# Patient Record
Sex: Female | Born: 1970 | Race: White | Hispanic: No | Marital: Single | Smoking: Current every day smoker
Health system: Southern US, Community
[De-identification: ages and names within clinical notes are randomized; demographics above are authoritative.]

## PROBLEM LIST (undated history)

## (undated) DIAGNOSIS — F1010 Alcohol abuse, uncomplicated: Secondary | ICD-10-CM

## (undated) DIAGNOSIS — F32A Depression, unspecified: Secondary | ICD-10-CM

## (undated) DIAGNOSIS — K219 Gastro-esophageal reflux disease without esophagitis: Secondary | ICD-10-CM

## (undated) DIAGNOSIS — F329 Major depressive disorder, single episode, unspecified: Secondary | ICD-10-CM

## (undated) DIAGNOSIS — F4310 Post-traumatic stress disorder, unspecified: Secondary | ICD-10-CM

## (undated) DIAGNOSIS — F419 Anxiety disorder, unspecified: Secondary | ICD-10-CM

## (undated) DIAGNOSIS — I10 Essential (primary) hypertension: Secondary | ICD-10-CM

---

## 2005-04-04 ENCOUNTER — Ambulatory Visit: Payer: Self-pay

## 2006-09-06 ENCOUNTER — Inpatient Hospital Stay: Payer: Self-pay | Admitting: Obstetrics & Gynecology

## 2006-11-07 ENCOUNTER — Emergency Department: Payer: Self-pay | Admitting: Emergency Medicine

## 2006-11-22 ENCOUNTER — Emergency Department: Payer: Self-pay

## 2008-04-15 ENCOUNTER — Emergency Department: Payer: Self-pay | Admitting: Emergency Medicine

## 2010-08-29 ENCOUNTER — Ambulatory Visit: Payer: Self-pay | Admitting: Family Medicine

## 2010-10-24 ENCOUNTER — Encounter: Payer: Self-pay | Admitting: Family Medicine

## 2010-11-23 ENCOUNTER — Encounter: Payer: Self-pay | Admitting: Family Medicine

## 2012-07-21 ENCOUNTER — Emergency Department: Payer: Self-pay | Admitting: Emergency Medicine

## 2013-05-26 ENCOUNTER — Emergency Department: Payer: Self-pay | Admitting: Internal Medicine

## 2013-05-26 LAB — COMPREHENSIVE METABOLIC PANEL
Albumin: 4.4 g/dL (ref 3.4–5.0)
Alkaline Phosphatase: 86 U/L (ref 50–136)
BUN: 8 mg/dL (ref 7–18)
Bilirubin,Total: 0.3 mg/dL (ref 0.2–1.0)
Chloride: 103 mmol/L (ref 98–107)
Co2: 23 mmol/L (ref 21–32)
Creatinine: 0.78 mg/dL (ref 0.60–1.30)
EGFR (African American): >60
Glucose: 104 mg/dL — ABNORMAL HIGH (ref 65–99)
Potassium: 3.8 mmol/L (ref 3.5–5.1)
SGOT(AST): 45 U/L — ABNORMAL HIGH (ref 15–37)
SGPT (ALT): 43 U/L (ref 12–78)
Total Protein: 8.3 g/dL — ABNORMAL HIGH (ref 6.4–8.2)

## 2013-05-26 LAB — URINALYSIS, COMPLETE
Glucose,UR: NEGATIVE mg/dL (ref 0–75)
Ketone: NEGATIVE
Leukocyte Esterase: NEGATIVE
Protein: NEGATIVE
Squamous Epithelial: 8
WBC UR: 2 /HPF (ref 0–5)

## 2013-05-26 LAB — DRUG SCREEN, URINE
Amphetamines, Ur Screen: NEGATIVE (ref ?–1000)
Barbiturates, Ur Screen: NEGATIVE (ref ?–200)
Benzodiazepine, Ur Scrn: NEGATIVE (ref ?–200)
Cannabinoid 50 Ng, Ur ~~LOC~~: NEGATIVE (ref ?–50)
Cocaine Metabolite,Ur ~~LOC~~: NEGATIVE (ref ?–300)
MDMA (Ecstasy)Ur Screen: NEGATIVE (ref ?–500)
Methadone, Ur Screen: NEGATIVE (ref ?–300)
Opiate, Ur Screen: NEGATIVE (ref ?–300)

## 2013-05-26 LAB — ETHANOL: Ethanol: 244 mg/dL

## 2013-05-26 LAB — ACETAMINOPHEN LEVEL: Acetaminophen: <2 ug/mL

## 2013-05-26 LAB — CBC
MCH: 35.5 pg — ABNORMAL HIGH (ref 26.0–34.0)
MCHC: 34.3 g/dL (ref 32.0–36.0)
RBC: 4.34 10*6/uL (ref 3.80–5.20)
WBC: 7.5 10*3/uL (ref 3.6–11.0)

## 2013-05-26 LAB — SALICYLATE LEVEL: Salicylates, Serum: 5.4 mg/dL — ABNORMAL HIGH

## 2013-05-26 LAB — TSH: Thyroid Stimulating Horm: 0.768 u[IU]/mL

## 2013-10-05 ENCOUNTER — Emergency Department: Payer: Self-pay | Admitting: Emergency Medicine

## 2013-10-05 LAB — TSH: Thyroid Stimulating Horm: 1.98 u[IU]/mL

## 2013-10-05 LAB — COMPREHENSIVE METABOLIC PANEL
Albumin: 4.4 g/dL (ref 3.4–5.0)
Alkaline Phosphatase: 95 U/L (ref 50–136)
Anion Gap: 11 (ref 7–16)
Chloride: 100 mmol/L (ref 98–107)
Creatinine: 0.62 mg/dL (ref 0.60–1.30)
EGFR (African American): >60
Glucose: 78 mg/dL (ref 65–99)
Osmolality: 264 (ref 275–301)
Potassium: 3.9 mmol/L (ref 3.5–5.1)
SGPT (ALT): 46 U/L (ref 12–78)
Sodium: 134 mmol/L — ABNORMAL LOW (ref 136–145)
Total Protein: 8 g/dL (ref 6.4–8.2)

## 2013-10-05 LAB — URINALYSIS, COMPLETE
Blood: NEGATIVE
Glucose,UR: NEGATIVE mg/dL (ref 0–75)
Ketone: NEGATIVE
RBC,UR: 2 /HPF (ref 0–5)
Specific Gravity: 1.003 (ref 1.003–1.030)
Squamous Epithelial: 1
WBC UR: <1 /HPF (ref 0–5)

## 2013-10-05 LAB — DRUG SCREEN, URINE
Benzodiazepine, Ur Scrn: NEGATIVE (ref ?–200)
MDMA (Ecstasy)Ur Screen: NEGATIVE (ref ?–500)
Methadone, Ur Screen: NEGATIVE (ref ?–300)
Opiate, Ur Screen: NEGATIVE (ref ?–300)
Phencyclidine (PCP) Ur S: NEGATIVE (ref ?–25)

## 2013-10-05 LAB — ETHANOL
Ethanol %: 0.089 % — ABNORMAL HIGH (ref 0.000–0.080)
Ethanol: 89 mg/dL

## 2013-10-05 LAB — CBC
HCT: 41.2 % (ref 35.0–47.0)
HGB: 14.6 g/dL (ref 12.0–16.0)
MCH: 37.4 pg — ABNORMAL HIGH (ref 26.0–34.0)
MCV: 105 fL — ABNORMAL HIGH (ref 80–100)
RBC: 3.91 10*6/uL (ref 3.80–5.20)
RDW: 13.1 % (ref 11.5–14.5)
WBC: 6.1 10*3/uL (ref 3.6–11.0)

## 2013-10-05 LAB — PREGNANCY, URINE: Pregnancy Test, Urine: NEGATIVE m[IU]/mL
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
## 2015-12-19 ENCOUNTER — Emergency Department: Payer: Medicaid Other

## 2015-12-19 ENCOUNTER — Encounter: Payer: Self-pay | Admitting: Emergency Medicine

## 2015-12-19 ENCOUNTER — Inpatient Hospital Stay
Admission: EM | Admit: 2015-12-19 | Discharge: 2015-12-23 | DRG: 872 | Disposition: A | Discharge status: Home / Self Care | Payer: Medicaid Other | Attending: Internal Medicine | Admitting: Internal Medicine

## 2015-12-19 DIAGNOSIS — F10930 Alcohol use, unspecified with withdrawal, uncomplicated: Secondary | ICD-10-CM

## 2015-12-19 DIAGNOSIS — B852 Pediculosis, unspecified: Secondary | ICD-10-CM | POA: Diagnosis present

## 2015-12-19 DIAGNOSIS — Z23 Encounter for immunization: Secondary | ICD-10-CM

## 2015-12-19 DIAGNOSIS — F10230 Alcohol dependence with withdrawal, uncomplicated: Secondary | ICD-10-CM

## 2015-12-19 DIAGNOSIS — K760 Fatty (change of) liver, not elsewhere classified: Secondary | ICD-10-CM | POA: Diagnosis present

## 2015-12-19 DIAGNOSIS — F17210 Nicotine dependence, cigarettes, uncomplicated: Secondary | ICD-10-CM | POA: Diagnosis present

## 2015-12-19 DIAGNOSIS — Z79899 Other long term (current) drug therapy: Secondary | ICD-10-CM

## 2015-12-19 DIAGNOSIS — B349 Viral infection, unspecified: Secondary | ICD-10-CM

## 2015-12-19 DIAGNOSIS — N12 Tubulo-interstitial nephritis, not specified as acute or chronic: Secondary | ICD-10-CM | POA: Diagnosis present

## 2015-12-19 DIAGNOSIS — D6959 Other secondary thrombocytopenia: Secondary | ICD-10-CM | POA: Diagnosis present

## 2015-12-19 DIAGNOSIS — R05 Cough: Secondary | ICD-10-CM | POA: Diagnosis present

## 2015-12-19 DIAGNOSIS — Z888 Allergy status to other drugs, medicaments and biological substances status: Secondary | ICD-10-CM

## 2015-12-19 DIAGNOSIS — F329 Major depressive disorder, single episode, unspecified: Secondary | ICD-10-CM | POA: Diagnosis present

## 2015-12-19 DIAGNOSIS — A4151 Sepsis due to Escherichia coli [E. coli]: Principal | ICD-10-CM | POA: Diagnosis present

## 2015-12-19 DIAGNOSIS — F10239 Alcohol dependence with withdrawal, unspecified: Secondary | ICD-10-CM | POA: Diagnosis present

## 2015-12-19 HISTORY — DX: Depression, unspecified: F32.A

## 2015-12-19 HISTORY — DX: Alcohol abuse, uncomplicated: F10.10

## 2015-12-19 HISTORY — DX: Major depressive disorder, single episode, unspecified: F32.9

## 2015-12-19 LAB — URINALYSIS COMPLETE WITH MICROSCOPIC (ARMC ONLY)
GLUCOSE, UA: NEGATIVE mg/dL
NITRITE: NEGATIVE
Protein, ur: >300 mg/dL — AB
SPECIFIC GRAVITY, URINE: 1.025 (ref 1.005–1.030)
pH: 5 (ref 5.0–8.0)

## 2015-12-19 LAB — CBC
HCT: 48.8 % — ABNORMAL HIGH (ref 35.0–47.0)
HEMOGLOBIN: 16.7 g/dL — AB (ref 12.0–16.0)
MCH: 36.3 pg — ABNORMAL HIGH (ref 26.0–34.0)
MCHC: 34.2 g/dL (ref 32.0–36.0)
MCV: 106.3 fL — ABNORMAL HIGH (ref 80.0–100.0)
PLATELETS: 83 10*3/uL — AB (ref 150–440)
RBC: 4.59 MIL/uL (ref 3.80–5.20)
RDW: 14.2 % (ref 11.5–14.5)
WBC: 8.3 10*3/uL (ref 3.6–11.0)

## 2015-12-19 LAB — COMPREHENSIVE METABOLIC PANEL
ALK PHOS: 88 U/L (ref 38–126)
ALT: 61 U/L — AB (ref 14–54)
ANION GAP: 15 (ref 5–15)
AST: 78 U/L — ABNORMAL HIGH (ref 15–41)
Albumin: 4.4 g/dL (ref 3.5–5.0)
BUN: 9 mg/dL (ref 6–20)
CALCIUM: 9.5 mg/dL (ref 8.9–10.3)
CO2: 24 mmol/L (ref 22–32)
CREATININE: 0.71 mg/dL (ref 0.44–1.00)
Chloride: 91 mmol/L — ABNORMAL LOW (ref 101–111)
Glucose, Bld: 143 mg/dL — ABNORMAL HIGH (ref 65–99)
Potassium: 3.6 mmol/L (ref 3.5–5.1)
SODIUM: 130 mmol/L — AB (ref 135–145)
Total Bilirubin: 1.1 mg/dL (ref 0.3–1.2)
Total Protein: 8.3 g/dL — ABNORMAL HIGH (ref 6.5–8.1)

## 2015-12-19 LAB — TSH: TSH: 1.277 u[IU]/mL (ref 0.350–4.500)

## 2015-12-19 LAB — INFLUENZA PANEL BY PCR (TYPE A & B)
H1N1 flu by pcr: NOT DETECTED
INFLBPCR: NEGATIVE
Influenza A By PCR: NEGATIVE

## 2015-12-19 LAB — LIPASE, BLOOD: LIPASE: 24 U/L (ref 11–51)

## 2015-12-19 LAB — POCT PREGNANCY, URINE: Preg Test, Ur: NEGATIVE

## 2015-12-19 MED ORDER — ONDANSETRON HCL 4 MG/2ML IJ SOLN
4.0000 mg | Freq: Four times a day (QID) | INTRAMUSCULAR | Status: DC | PRN
  Administered 2015-12-20 – 2015-12-21 (×3): 4 mg via INTRAVENOUS
  Filled 2015-12-19 (×3): qty 2

## 2015-12-19 MED ORDER — SODIUM CHLORIDE 0.9 % IV SOLN
Freq: Once | INTRAVENOUS | Status: AC
  Administered 2015-12-19: 14:00:00 via INTRAVENOUS

## 2015-12-19 MED ORDER — ONDANSETRON HCL 4 MG/2ML IJ SOLN
4.0000 mg | Freq: Once | INTRAMUSCULAR | Status: AC
  Administered 2015-12-19: 4 mg via INTRAVENOUS
  Filled 2015-12-19: qty 2

## 2015-12-19 MED ORDER — HEPARIN SODIUM (PORCINE) 5000 UNIT/ML IJ SOLN
5000.0000 [IU] | Freq: Three times a day (TID) | INTRAMUSCULAR | Status: DC
  Administered 2015-12-19 – 2015-12-20 (×5): 5000 [IU] via SUBCUTANEOUS
  Filled 2015-12-19 (×5): qty 1

## 2015-12-19 MED ORDER — IPRATROPIUM-ALBUTEROL 0.5-2.5 (3) MG/3ML IN SOLN
3.0000 mL | RESPIRATORY_TRACT | Status: DC
  Administered 2015-12-19 – 2015-12-20 (×6): 3 mL via RESPIRATORY_TRACT
  Filled 2015-12-19 (×7): qty 3

## 2015-12-19 MED ORDER — FOLIC ACID 1 MG PO TABS
1.0000 mg | ORAL_TABLET | Freq: Every day | ORAL | Status: DC
  Administered 2015-12-19 – 2015-12-22 (×4): 1 mg via ORAL
  Filled 2015-12-19 (×4): qty 1

## 2015-12-19 MED ORDER — CEFTRIAXONE SODIUM 1 G IJ SOLR
1.0000 g | INTRAMUSCULAR | Status: DC
  Administered 2015-12-20 – 2015-12-21 (×2): 1 g via INTRAVENOUS
  Filled 2015-12-19 (×3): qty 10

## 2015-12-19 MED ORDER — SODIUM CHLORIDE 0.9 % IV SOLN
INTRAVENOUS | Status: DC
  Administered 2015-12-20 (×2): via INTRAVENOUS
  Administered 2015-12-21: 1000 mL via INTRAVENOUS
  Administered 2015-12-21 (×2): via INTRAVENOUS

## 2015-12-19 MED ORDER — DEXTROSE 5 % IV SOLN
1.0000 g | Freq: Once | INTRAVENOUS | Status: AC
  Administered 2015-12-19: 1 g via INTRAVENOUS
  Filled 2015-12-19: qty 10

## 2015-12-19 MED ORDER — VANCOMYCIN HCL IN DEXTROSE 1-5 GM/200ML-% IV SOLN
1000.0000 mg | Freq: Once | INTRAVENOUS | Status: AC
  Administered 2015-12-19: 1000 mg via INTRAVENOUS
  Filled 2015-12-19: qty 200

## 2015-12-19 MED ORDER — IBUPROFEN 400 MG PO TABS
800.0000 mg | ORAL_TABLET | Freq: Four times a day (QID) | ORAL | Status: DC | PRN
  Administered 2015-12-19 – 2015-12-20 (×2): 800 mg via ORAL
  Filled 2015-12-19 (×2): qty 2

## 2015-12-19 MED ORDER — HYDROMORPHONE HCL 1 MG/ML IJ SOLN
1.0000 mg | Freq: Once | INTRAMUSCULAR | Status: AC
  Administered 2015-12-19: 1 mg via INTRAVENOUS
  Filled 2015-12-19: qty 1

## 2015-12-19 MED ORDER — LORAZEPAM 2 MG PO TABS
0.0000 mg | ORAL_TABLET | Freq: Two times a day (BID) | ORAL | Status: DC
  Administered 2015-12-21 – 2015-12-23 (×4): 2 mg via ORAL
  Filled 2015-12-19 (×3): qty 1

## 2015-12-19 MED ORDER — LORAZEPAM 2 MG/ML IJ SOLN
0.0000 mg | Freq: Two times a day (BID) | INTRAMUSCULAR | Status: AC
  Administered 2015-12-19 – 2015-12-20 (×2): 2 mg via INTRAVENOUS
  Filled 2015-12-19 (×2): qty 1

## 2015-12-19 MED ORDER — LORAZEPAM 2 MG/ML IJ SOLN
1.0000 mg | Freq: Once | INTRAMUSCULAR | Status: AC
  Administered 2015-12-19: 1 mg via INTRAVENOUS
  Filled 2015-12-19: qty 1

## 2015-12-19 MED ORDER — FLUOXETINE HCL 20 MG PO CAPS
20.0000 mg | ORAL_CAPSULE | Freq: Every day | ORAL | Status: DC
  Administered 2015-12-19 – 2015-12-22 (×4): 20 mg via ORAL
  Filled 2015-12-19 (×4): qty 1

## 2015-12-19 MED ORDER — ONDANSETRON HCL 4 MG PO TABS
4.0000 mg | ORAL_TABLET | Freq: Four times a day (QID) | ORAL | Status: DC | PRN
  Administered 2015-12-22: 4 mg via ORAL
  Filled 2015-12-19: qty 1

## 2015-12-19 MED ORDER — ACETAMINOPHEN 325 MG PO TABS
650.0000 mg | ORAL_TABLET | ORAL | Status: DC | PRN
  Administered 2015-12-19 – 2015-12-21 (×3): 650 mg via ORAL
  Filled 2015-12-19 (×3): qty 2

## 2015-12-19 MED ORDER — BUSPIRONE HCL 10 MG PO TABS
20.0000 mg | ORAL_TABLET | Freq: Every day | ORAL | Status: DC
  Administered 2015-12-19 – 2015-12-22 (×4): 20 mg via ORAL
  Filled 2015-12-19 (×4): qty 2

## 2015-12-19 MED ORDER — SODIUM CHLORIDE 0.9 % IJ SOLN
3.0000 mL | Freq: Two times a day (BID) | INTRAMUSCULAR | Status: DC
  Administered 2015-12-19 – 2015-12-22 (×5): 3 mL via INTRAVENOUS

## 2015-12-19 MED ORDER — SODIUM CHLORIDE 0.9 % IV BOLUS (SEPSIS)
1000.0000 mL | Freq: Once | INTRAVENOUS | Status: AC
  Administered 2015-12-19: 1000 mL via INTRAVENOUS

## 2015-12-19 MED ORDER — LORAZEPAM 2 MG/ML IJ SOLN
0.0000 mg | Freq: Four times a day (QID) | INTRAMUSCULAR | Status: AC
  Administered 2015-12-20 – 2015-12-21 (×2): 2 mg via INTRAVENOUS
  Filled 2015-12-19 (×2): qty 1

## 2015-12-19 MED ORDER — TRAZODONE HCL 100 MG PO TABS
100.0000 mg | ORAL_TABLET | Freq: Every day | ORAL | Status: DC
  Administered 2015-12-19 – 2015-12-22 (×4): 100 mg via ORAL
  Filled 2015-12-19 (×4): qty 1

## 2015-12-19 MED ORDER — HYDROCOD POLST-CPM POLST ER 10-8 MG/5ML PO SUER
5.0000 mL | Freq: Two times a day (BID) | ORAL | Status: DC | PRN
  Administered 2015-12-20 – 2015-12-22 (×2): 5 mL via ORAL
  Filled 2015-12-19 (×2): qty 5

## 2015-12-19 MED ORDER — THIAMINE HCL 100 MG/ML IJ SOLN: 100.0000 mg | Freq: Every day | INTRAMUSCULAR | Status: DC

## 2015-12-19 MED ORDER — NICOTINE 21 MG/24HR TD PT24
21.0000 mg | MEDICATED_PATCH | Freq: Every day | TRANSDERMAL | Status: DC
  Administered 2015-12-19 – 2015-12-22 (×4): 21 mg via TRANSDERMAL
  Filled 2015-12-19 (×4): qty 1

## 2015-12-19 MED ORDER — VITAMIN B-1 100 MG PO TABS
100.0000 mg | ORAL_TABLET | Freq: Every day | ORAL | Status: DC
  Administered 2015-12-19 – 2015-12-22 (×4): 100 mg via ORAL
  Filled 2015-12-19 (×4): qty 1

## 2015-12-19 MED ORDER — ALBUTEROL SULFATE (2.5 MG/3ML) 0.083% IN NEBU: 2.5000 mg | INHALATION_SOLUTION | RESPIRATORY_TRACT | Status: DC | PRN

## 2015-12-19 MED ORDER — ONDANSETRON HCL 4 MG/2ML IJ SOLN
4.0000 mg | Freq: Once | INTRAMUSCULAR | Status: AC | PRN
  Administered 2015-12-19: 4 mg via INTRAVENOUS
  Filled 2015-12-19: qty 2

## 2015-12-19 MED ORDER — LORAZEPAM 2 MG PO TABS
0.0000 mg | ORAL_TABLET | Freq: Four times a day (QID) | ORAL | Status: AC
  Administered 2015-12-21: 2 mg via ORAL
  Filled 2015-12-19 (×2): qty 1

## 2015-12-19 MED ORDER — VANCOMYCIN HCL IN DEXTROSE 1-5 GM/200ML-% IV SOLN
1000.0000 mg | Freq: Two times a day (BID) | INTRAVENOUS | Status: DC
  Administered 2015-12-19 – 2015-12-20 (×2): 1000 mg via INTRAVENOUS
  Filled 2015-12-19 (×3): qty 200

## 2015-12-19 MED ORDER — MORPHINE SULFATE (PF) 2 MG/ML IV SOLN
2.0000 mg | INTRAVENOUS | Status: DC | PRN
  Administered 2015-12-19 – 2015-12-20 (×6): 2 mg via INTRAVENOUS
  Filled 2015-12-19 (×7): qty 1

## 2015-12-19 NOTE — ED Notes (Signed)
Pt vomiting in triage 

## 2015-12-19 NOTE — Progress Notes (Signed)
Pt running a fever of 102.9 on admission. Notified Dr. Sheryle Hailiamond and obtained tylenol 650mg  Q4hours. Tylenol given and fever now 102.5. Notified Dr. Sheryle Hailiamond of results postmedication. New orders to be entered for cough syrup and Ibuprofen. Adelina MingsKim Miyu Fenderson RN-BC [phone]

## 2015-12-19 NOTE — H&P (Signed)
Anna Lucas is an 44 y.o. female.   Chief Complaint: Vomiting HPI: The patient presents emergency department complaining of vomiting that began last night. She states it is nonbloody and nonbilious but that she has not been able to get any relief. She also complains of right flank pain as well as fevers. In the emergency department she was found to be febrile. Urinalysis showed infection which prompted emergency department staff to obtain the abdomen which showed pyelonephritis. The patient met sepsis criteria was found to the emergency department staff to call for admission.  Past Medical History  Diagnosis Date  . Depression   . Alcohol abuse     Past Surgical History  Procedure Laterality Date  . Cesarean section      Family History  Problem Relation Age of Onset  . CAD Paternal Grandfather   . Stroke Paternal Grandfather   . Diabetes Mellitus II Maternal Grandmother   . Emphysema Maternal Grandmother    Social History:  reports that she has been smoking Cigarettes.  She has been smoking about 1.00 pack per day. She does not have any smokeless tobacco history on file. She reports that she drinks alcohol. Her drug history is not on file.  Allergies:  Allergies  Allergen Reactions  . Prednisone     Feet swelling     Medications Prior to Admission  Medication Sig Dispense Refill  . busPIRone (BUSPAR) 10 MG tablet Take 20 mg by mouth at bedtime.    Marland Kitchen FLUoxetine (PROZAC) 10 MG capsule Take 20 mg by mouth daily.     . traZODone (DESYREL) 50 MG tablet Take 100 mg by mouth at bedtime.      Results for orders placed or performed during the hospital encounter of 12/19/15 (from the past 48 hour(s))  Lipase, blood     Status: None   Collection Time: 12/19/15 11:01 AM  Result Value Ref Range   Lipase 24 11 - 51 U/L  Comprehensive metabolic panel     Status: Abnormal   Collection Time: 12/19/15 11:01 AM  Result Value Ref Range   Sodium 130 (L) 135 - 145 mmol/L   Potassium 3.6  3.5 - 5.1 mmol/L   Chloride 91 (L) 101 - 111 mmol/L   CO2 24 22 - 32 mmol/L   Glucose, Bld 143 (H) 65 - 99 mg/dL   BUN 9 6 - 20 mg/dL   Creatinine, Ser 0.71 0.44 - 1.00 mg/dL   Calcium 9.5 8.9 - 10.3 mg/dL   Total Protein 8.3 (H) 6.5 - 8.1 g/dL   Albumin 4.4 3.5 - 5.0 g/dL   AST 78 (H) 15 - 41 U/L   ALT 61 (H) 14 - 54 U/L   Alkaline Phosphatase 88 38 - 126 U/L   Total Bilirubin 1.1 0.3 - 1.2 mg/dL   GFR calc non Af Amer >60 >60 mL/min   GFR calc Af Amer >60 >60 mL/min    Comment: (NOTE) The eGFR has been calculated using the CKD EPI equation. This calculation has not been validated in all clinical situations. eGFR's persistently <60 mL/min signify possible Chronic Kidney Disease.    Anion gap 15 5 - 15  CBC     Status: Abnormal   Collection Time: 12/19/15 11:01 AM  Result Value Ref Range   WBC 8.3 3.6 - 11.0 K/uL   RBC 4.59 3.80 - 5.20 MIL/uL   Hemoglobin 16.7 (H) 12.0 - 16.0 g/dL   HCT 48.8 (H) 35.0 - 47.0 %  MCV 106.3 (H) 80.0 - 100.0 fL   MCH 36.3 (H) 26.0 - 34.0 pg   MCHC 34.2 32.0 - 36.0 g/dL   RDW 14.2 11.5 - 14.5 %   Platelets 83 (L) 150 - 440 K/uL  TSH     Status: None   Collection Time: 12/19/15 11:01 AM  Result Value Ref Range   TSH 1.277 0.350 - 4.500 uIU/mL  Urinalysis complete, with microscopic ([facility] only)     Status: Abnormal   Collection Time: 12/19/15 11:30 AM  Result Value Ref Range   Color, Urine AMBER (A) YELLOW   APPearance CLOUDY (A) CLEAR   Glucose, UA NEGATIVE NEGATIVE mg/dL   Bilirubin Urine 2+ (A) NEGATIVE   Ketones, ur 2+ (A) NEGATIVE mg/dL   Specific Gravity, Urine 1.025 1.005 - 1.030   Hgb urine dipstick 1+ (A) NEGATIVE   pH 5.0 5.0 - 8.0   Protein, ur >300 (A) NEGATIVE mg/dL   Nitrite NEGATIVE NEGATIVE   Leukocytes, UA 3+ (A) NEGATIVE   RBC / HPF TOO NUMEROUS TO COUNT 0 - 5 RBC/hpf   WBC, UA TOO NUMEROUS TO COUNT 0 - 5 WBC/hpf   Bacteria, UA FEW (A) NONE SEEN   Squamous Epithelial / LPF 6-30 (A) NONE SEEN   WBC Clumps PRESENT     Mucous PRESENT   Influenza panel by PCR (type A & B, H1N1)     Status: None   Collection Time: 12/19/15  1:35 PM  Result Value Ref Range   Influenza A By PCR NEGATIVE NEGATIVE   Influenza B By PCR NEGATIVE NEGATIVE   H1N1 flu by pcr NOT DETECTED NOT DETECTED    Comment:        The Xpert Flu assay (FDA approved for nasal aspirates or washes and nasopharyngeal swab specimens), is intended as an aid in the diagnosis of influenza and should not be used as a sole basis for treatment.   Pregnancy, urine POC     Status: None   Collection Time: 12/19/15  1:40 PM  Result Value Ref Range   Preg Test, Ur NEGATIVE NEGATIVE    Comment:        THE SENSITIVITY OF THIS METHODOLOGY IS >24 mIU/mL    Dg Chest 2 View  12/19/2015  CLINICAL DATA:  Mild wheezing, chest pain and shortness of breath for 2 weeks. Initial encounter. EXAM: CHEST  2 VIEW COMPARISON:  None. FINDINGS: The lungs are clear. Heart size is normal. There is no pneumothorax or pleural effusion. No focal bony abnormality. IMPRESSION: No acute disease. Electronically Signed   By: Inge Rise M.D.   On: 12/19/2015 14:28   Ct Renal Stone Study  12/19/2015  CLINICAL DATA:  Right lower quadrant abdominal pain, dysuria and hematuria EXAM: CT ABDOMEN AND PELVIS WITHOUT CONTRAST TECHNIQUE: Multidetector CT imaging of the abdomen and pelvis was performed following the standard protocol without IV contrast. COMPARISON:  None. FINDINGS: Lower chest:  No acute findings. Hepatobiliary: Diffuse hypoattenuation of the liver parenchyma compatible with hepatic steatosis or fatty infiltration. No biliary dilatation. Gallbladder and biliary system unremarkable. Pancreas: No mass or inflammatory process identified on this un-enhanced exam. Spleen: Within normal limits in size. Adrenals/Urinary Tract: Normal adrenal glands. Left kidney and ureter demonstrate no acute obstruction, hydronephrosis, or ureteral calculus. Right kidney demonstrates slight  malrotation and surrounding perinephric strandy edema and inflammation diffusely. No associated hydronephrosis are visualized obstructing ureteral calculus. This may be related to recent stone passage versus acute right pyelonephritis. Stomach/Bowel: Negative for  bowel obstruction, dilatation, ileus, or free air. Normal appendix demonstrated. No fluid collection or abscess. Vascular/Lymphatic: No adenopathy. Minor aortic atherosclerosis without aneurysm. No retroperitoneal hemorrhage. Reproductive: No mass or other significant abnormality. Other: Intact abdominal wall.  No inguinal abnormality or hernia. Musculoskeletal:  no acute or abnormal osseous finding. IMPRESSION: Right perinephric strandy edema and inflammation without hydronephrosis or obstructing urinary tract calculus. This could be secondary to ascending urinary tract infection or pyelonephritis versus recent stone passage. Hepatic steatosis No other acute intra-abdominal or pelvic finding.  Normal appendix. Electronically Signed   By: Jerilynn Mages.  Shick M.D.   On: 12/19/2015 14:17    Review of Systems  Constitutional: Positive for fever. Negative for chills.  HENT: Negative for sore throat and tinnitus.   Eyes: Negative for blurred vision and redness.  Respiratory: Negative for cough and shortness of breath.   Cardiovascular: Negative for chest pain, palpitations, orthopnea and PND.  Gastrointestinal: Positive for nausea and vomiting. Negative for abdominal pain and diarrhea.  Genitourinary: Positive for dysuria, frequency and flank pain. Negative for urgency.  Musculoskeletal: Negative for myalgias and joint pain.  Skin: Negative for rash.       No lesions  Neurological: Negative for speech change, focal weakness and weakness.  Endo/Heme/Allergies: Does not bruise/bleed easily.       No temperature intolerance  Psychiatric/Behavioral: Negative for depression and suicidal ideas.    Blood pressure 142/92, pulse 121, temperature 102.5 F (39.2  C), temperature source Oral, resp. rate 17, height 5' 5"  (1.651 m), weight 77.111 kg (170 lb), last menstrual period 12/05/2015, SpO2 91 %. Physical Exam  Nursing note and vitals reviewed. Constitutional: She is oriented to person, place, and time. She appears well-developed and well-nourished. No distress.  HENT:  Head: Normocephalic and atraumatic.  Mouth/Throat: Oropharynx is clear and moist.  Eyes: Conjunctivae and EOM are normal. Pupils are equal, round, and reactive to light. No scleral icterus.  Neck: Normal range of motion. Neck supple. No JVD present. No tracheal deviation present. No thyromegaly present.  Cardiovascular: Normal rate, regular rhythm and normal heart sounds.  Exam reveals no gallop and no friction rub.   No murmur heard. Respiratory: Effort normal. She has wheezes in the left lower field.  GI: Soft. Bowel sounds are normal. She exhibits no distension. There is no tenderness.  Genitourinary:  Deferred  Musculoskeletal: Normal range of motion. She exhibits no edema.  Lymphadenopathy:    She has no cervical adenopathy.  Neurological: She is alert and oriented to person, place, and time. No cranial nerve deficit. She exhibits normal muscle tone.  Skin: Skin is warm and dry. Rash (Nummular erythematous plaques on face) noted. No erythema.  Psychiatric: She has a normal mood and affect. Her behavior is normal. Judgment and thought content normal.     Assessment/Plan This is a 45 year old Caucasian female admitted for sepsis secondary to pyelonephritis.  1. Sepsis: The patient criteria via fever, tachycardia and tachypnea. She is hemodynamically stable. I have obtained cultures although she received 1 dose of ceftriaxone prior to taking sample. I've added vancomycin for broad spectrum coverage. Follow blood cultures for growth and sensitivities. 2. Pyelonephritis: We'll hydrate the patient as she is unable to consistently hold food down. Antibiotics as above. Will switch  to oral antibiotics when the patient is no longer septic 3. Alcohol abuse: The patient's last drink was last night. She drinks at least 2-3 beers per day. CIWA scale in place. Ativan as needed. Folic acid supplement added to regimen 4.  Tobacco abuse: NicoDerm patch 5. Cough: Likely bronchitic. Supportive care. 6. Depression: Continue trazodone, BuSpar and Prozac 7. DVT prophylaxis: Heparin 8. GI prophylaxis: None The patient is a full code. Time spent on admission orders and patient care approximately 45 minutes  Harrie Foreman 12/19/2015, 6:31 PM

## 2015-12-19 NOTE — ED Provider Notes (Signed)
[facility] Emergency Department Provider Note     Time seen: ----------------------------------------- 1:26 PM on 12/19/2015 -----------------------------------------    I have reviewed the triage vital signs and the nursing notes.   HISTORY  Chief Complaint Cough; Fever; Abdominal Pain; Dysuria; Ankle Pain; and Emesis    HPI Anna Lucas is a 44 y.o. female brought to the ER for a myriad of complaints. Patient presents with left ankle pain, cough, congestion, fever, right-sided flank pain and dysuria. Patient also states she hasn't had any alcohol since last night, typical drinks every day. Patient states she has had fever and chills, has a sharp right-sided pain. Nothing makes it better or worse.   History reviewed. No pertinent past medical history.  There are no active problems to display for this patient.   Past Surgical History  Procedure Laterality Date  . Cesarean section      Allergies Prednisone  Social History Social History  Substance Use Topics  . Smoking status: Current Every Day Smoker -- 1.00 packs/day    Types: Cigarettes  . Smokeless tobacco: None  . Alcohol Use: Yes     Comment: occas    Review of Systems Constitutional: Positive for fever and chills Eyes: Negative for visual changes. ENT: Negative for sore throat. Positive for nasal passage congestion Cardiovascular: Positive for left-sided chest pain Respiratory: Positive for cough with sputum production Gastrointestinal: Positive for abdominal pain with vomiting Genitourinary: Positive for dysuria Musculoskeletal: Positive for back pain Skin: Negative for rash. Neurological: Positive for headache and weakness  10-point ROS otherwise negative.  ____________________________________________   PHYSICAL EXAM:  VITAL SIGNS: ED Triage Vitals  Enc Vitals Group     BP 12/19/15 1054 134/109 mmHg     Pulse Rate 12/19/15 1054 134     Resp 12/19/15 1054 18   Temp 12/19/15 1054 100.3 F (37.9 C)     Temp Source 12/19/15 1054 Oral     SpO2 12/19/15 1054 97 %     Weight 12/19/15 1055 170 lb (77.111 kg)     Height 12/19/15 1055 5' 5"  (1.651 m)     Head Cir --      Peak Flow --      Pain Score 12/19/15 1055 9     Pain Loc --      Pain Edu? --      Excl. in [place]? --     Constitutional: Alert and oriented. Mild to moderate distress Eyes: Conjunctivae are normal. PERRL. Normal extraocular movements. ENT   Head: Normocephalic and atraumatic.   Nose: No congestion/rhinnorhea.   Mouth/Throat: Mucous membranes are moist.   Neck: No stridor. Cardiovascular: Rapid rate, regular rhythm. Normal and symmetric distal pulses are present in all extremities. No murmurs, rubs, or gallops. Respiratory: Normal respiratory effort without tachypnea nor retractions. Mild rhonchi bilaterally Gastrointestinal: Soft and nontender. Right flank tenderness, no rebound or guarding. Normal bowel sounds. Question CVA tenderness on the right. Musculoskeletal: Nontender with normal range of motion in all extremities. No joint effusions.  No lower extremity tenderness nor edema. Neurologic:  Normal speech and language. No gross focal neurologic deficits are appreciated. Speech is normal. No gait instability. Skin:  Skin is warm, dry and intact. No rash noted. Psychiatric: Mood and affect are normal. Speech and behavior are normal. Patient exhibits appropriate insight and judgment.  ____________________________________________  ED COURSE:  Pertinent labs & imaging results that were available during my care of the patient were reviewed by me and considered in my  medical decision making (see chart for details). Patient with multiple complaints, likely viral etiology in addition to alcohol withdrawal and possible kidney stone. ____________________________________________    LABS (pertinent positives/negatives)  Labs Reviewed  COMPREHENSIVE METABOLIC PANEL -  Abnormal; Notable for the following:    Sodium 130 (*)    Chloride 91 (*)    Glucose, Bld 143 (*)    Total Protein 8.3 (*)    AST 78 (*)    ALT 61 (*)    All other components within normal limits  CBC - Abnormal; Notable for the following:    Hemoglobin 16.7 (*)    HCT 48.8 (*)    MCV 106.3 (*)    MCH 36.3 (*)    Platelets 83 (*)    All other components within normal limits  URINALYSIS COMPLETEWITH MICROSCOPIC ([facility] ONLY) - Abnormal; Notable for the following:    Color, Urine AMBER (*)    APPearance CLOUDY (*)    Bilirubin Urine 2+ (*)    Ketones, ur 2+ (*)    Hgb urine dipstick 1+ (*)    Protein, ur >300 (*)    Leukocytes, UA 3+ (*)    Bacteria, UA FEW (*)    Squamous Epithelial / LPF 6-30 (*)    All other components within normal limits  LIPASE, BLOOD  INFLUENZA PANEL BY PCR (TYPE A & B, H1N1)  POCT PREGNANCY, URINE    RADIOLOGY Images were viewed by me  CT renal protocol, chest pain  IMPRESSION: Right perinephric strandy edema and inflammation without hydronephrosis or obstructing urinary tract calculus. This could be secondary to ascending urinary tract infection or pyelonephritis versus recent stone passage.  Hepatic steatosis  No other acute intra-abdominal or pelvic finding. Normal appendix. IMPRESSION: No acute disease.  ____________________________________________  FINAL ASSESSMENT AND PLAN  Viral syndrome, alcohol withdrawal, pyelonephritis  Plan: Patient with labs and imaging as dictated above. Patient looks ill, combination of viral syndrome plus alcohol withdrawal and pyelonephritis. I will place her on withdrawal precautions, she received IV Rocephin. I will also send a urine culture. She would benefit from hospitalization.   Earleen Newport, MD   Earleen Newport, MD 12/19/15 [phone]

## 2015-12-19 NOTE — ED Notes (Signed)
Here with left ankle pain, cough, congestion, fever, RLQ abd pain and burning with urination for a few days now.

## 2015-12-19 NOTE — Progress Notes (Signed)
ANTIBIOTIC CONSULT NOTE - INITIAL  Pharmacy Consult for Vancomycin  Indication: rule out sepsis  Allergies  Allergen Reactions  . Prednisone     Feet swelling     Patient Measurements: Height: 5\' 5"  (165.1 cm) Weight: 170 lb (77.111 kg) IBW/kg (Calculated) : 57 Adjusted Body Weight: 65 kg   Vital Signs: Temp: 102.9 F (39.4 C) (12/26 1701) Temp Source: Oral (12/26 1701) BP: 142/92 mmHg (12/26 1702) Pulse Rate: 121 (12/26 1702) Intake/Output from previous day:   Intake/Output from this shift:    Labs:  Recent Labs  12/19/15 1101  WBC 8.3  HGB 16.7*  PLT 83*  CREATININE 0.71   Estimated Creatinine Clearance: 94 mL/min (by C-G formula based on Cr of 0.71). No results for input(s): VANCOTROUGH, VANCOPEAK, VANCORANDOM, GENTTROUGH, GENTPEAK, GENTRANDOM, TOBRATROUGH, TOBRAPEAK, TOBRARND, AMIKACINPEAK, AMIKACINTROU, AMIKACIN in the last 72 hours.   Microbiology: No results found for this or any previous visit (from the past 720 hour(s)).  Medical History: History reviewed. No pertinent past medical history.  Medications:  Prescriptions prior to admission  Medication Sig Dispense Refill Last Dose  . busPIRone (BUSPAR) 10 MG tablet Take 20 mg by mouth at bedtime.   12/18/2015 at Unknown time  . FLUoxetine (PROZAC) 10 MG capsule Take 20 mg by mouth daily.    12/18/2015 at Unknown time  . traZODone (DESYREL) 50 MG tablet Take 100 mg by mouth at bedtime.   12/18/2015 at Unknown time   Assessment: CrCl = 94 ml/min Ke = 0.08 hr-s T1/2 = 8.7 hrs Vd = 45.5 L   Goal of Therapy:  Vancomycin trough level 15-20 mcg/ml  Plan:  Expected duration 7 days with resolution of temperature and/or normalization of WBC   Vancomycin 1 gm IV X 1 given on 12/26 @ 16:00. Vancomycin 1 gm IV Q12H ordered to start 12/26 @ 22:00, ~ 6 hrs after 1st dose (stacked dosing). This pt will reach [facility] by 12/26 @ 16:00. Will draw 1st trough on 12/28 @ 21:30, which will be at [facility].    Chalon Zobrist D 12/19/2015,5:26 PM

## 2015-12-20 LAB — HEMOGLOBIN A1C: Hgb A1c MFr Bld: 4.9 % (ref 4.0–6.0)

## 2015-12-20 LAB — GLUCOSE, CAPILLARY: Glucose-Capillary: 106 mg/dL — ABNORMAL HIGH (ref 65–99)

## 2015-12-20 MED ORDER — IPRATROPIUM-ALBUTEROL 0.5-2.5 (3) MG/3ML IN SOLN
3.0000 mL | Freq: Four times a day (QID) | RESPIRATORY_TRACT | Status: DC | PRN
  Administered 2015-12-21 – 2015-12-22 (×2): 3 mL via RESPIRATORY_TRACT
  Filled 2015-12-20 (×2): qty 3

## 2015-12-20 MED ORDER — INFLUENZA VAC SPLIT QUAD 0.5 ML IM SUSY
0.5000 mL | PREFILLED_SYRINGE | INTRAMUSCULAR | Status: AC
  Administered 2015-12-21: 0.5 mL via INTRAMUSCULAR
  Filled 2015-12-20: qty 0.5

## 2015-12-20 MED ORDER — PROMETHAZINE HCL 25 MG/ML IJ SOLN
25.0000 mg | Freq: Four times a day (QID) | INTRAMUSCULAR | Status: DC | PRN
  Administered 2015-12-20 – 2015-12-23 (×3): 25 mg via INTRAVENOUS
  Filled 2015-12-20 (×3): qty 1

## 2015-12-20 NOTE — Evaluation (Signed)
Physical Therapy Evaluation Patient Details Name: Anna Lucas MRN: [id number] DOB: 02-May-1971 Today's Date: 12/20/2015   History of Present Illness  presented to ER secondary to R flank pain, constant vomiting; admitted with sepsis due to pyelonephritis.  Clinical Impression  Upon evaluation, patient alert and oriented, follows all commands and demonstrates good insight/safety awareness.  Bilat UE/LEs globally weakened due to medical condition, though functional for basic transfers and mobility.  Able to complete bed mobility with mod indep; sit/stand, basic transfers and short-distance gait (30') without assist device, cga/min assist.  Generally unsteady with R lateral LOB with initial standing, requiring +1 from therapist to recover.  Unable to tolerate additional activity/gait distance due to active vomiting during session (anti-emetics received prior to session).   Would benefit from skilled PT to address above deficits and promote optimal return to PLOF; will continue to follow during remaining hospitalization to ensure continued mobility and return to functional indep, but anticipate no skilled PT needs as medical condition clears.  Will continue to assess/update as appropriate.     Follow Up Recommendations No PT follow up (will continue to see throughout hospitalization to promote mobility as appropriate; anticipate no additional PT needs upon discharge as medical condition clears)    Equipment Recommendations       Recommendations for Other Services       Precautions / Restrictions Precautions Precautions: Fall Restrictions Weight Bearing Restrictions: No      Mobility  Bed Mobility Overal bed mobility: Modified Independent                Transfers Overall transfer level: Needs assistance Equipment used: None Transfers: Sit to/from Stand Sit to Stand: Min assist         General transfer comment: R lateral LOB with initial transition to upright, requiring  LE step strategy and +1 from therapist for recovery  Ambulation/Gait Ambulation/Gait assistance: Min guard;Min assist Ambulation Distance (Feet): 30 Feet Assistive device: None       General Gait Details: generally staggered and unsteady; LE stepping pattern, step height/length and foot placement generally inconsistent; reports feeling generally weak and unsteady.  Stairs            Wheelchair Mobility    Modified Rankin (Stroke Patients Only)       Balance Overall balance assessment: Needs assistance Sitting-balance support: No upper extremity supported;Feet supported Sitting balance-Leahy Scale: Good     Standing balance support: No upper extremity supported Standing balance-Leahy Scale: Fair                               Pertinent Vitals/Pain Pain Assessment: 0-10 Pain Score: 5  Pain Descriptors / Indicators: Aching Pain Intervention(s): Limited activity within patient's tolerance;Monitored during session;Repositioned (anti-emetics administered prior to session)    Home Living Family/patient expects to be discharged to:: Private residence Living Arrangements: Alone   Type of Home: House       Home Layout: Two level;Bed/bath upstairs        Prior Function Level of Independence: Independent         Comments: Indep with household and community mobility; employed as housekeeper at [employer]     Hand Dominance        Extremity/Trunk Assessment   Upper Extremity Assessment: Overall WFL for tasks assessed           Lower Extremity Assessment: Overall WFL for tasks assessed  Communication   Communication: No difficulties  Cognition Arousal/Alertness: Awake/alert Behavior During Therapy: WFL for tasks assessed/performed Overall Cognitive Status: Within Functional Limits for tasks assessed                      General Comments      Exercises        Assessment/Plan    PT Assessment Patient needs  continued PT services  PT Diagnosis Difficulty walking;Generalized weakness   PT Problem List Decreased activity tolerance;Decreased balance;Decreased mobility  PT Treatment Interventions DME instruction;Gait training;Stair training;Functional mobility training;Therapeutic activities;Therapeutic exercise;Balance training;Patient/family education   PT Goals (Current goals can be found in the Care Plan section) Acute Rehab PT Goals Patient Stated Goal: "to make this nausea go away" PT Goal Formulation: With patient/family Time For Goal Achievement: 01/03/16 Potential to Achieve Goals: Good    Frequency Min 2X/week   Barriers to discharge Inaccessible home environment;Decreased caregiver support      Co-evaluation               End of Session Equipment Utilized During Treatment: Gait belt Activity Tolerance:  (limited by nausea) Patient left: in bed;with bed alarm set;with call bell/phone within reach;with family/visitor present           Time: 1610-9604 PT Time Calculation (min) (ACUTE ONLY): 8 min   Charges:   PT Evaluation $Initial PT Evaluation Tier I: 1 Procedure     PT G Codes:        Princella Jaskiewicz H. Manson Passey, PT, DPT, NCS 12/20/2015, 11:19 AM [phone]

## 2015-12-20 NOTE — Progress Notes (Signed)
[facility] - [place] at [facility]   PATIENT NAME: Anna Lucas    MR#:  [id number]  DATE OF BIRTH:  27-Jun-1971  SUBJECTIVE:  CHIEF COMPLAINT:   Chief Complaint  Patient presents with  . Cough  . Fever  . Abdominal Pain    RLQ  . Dysuria  . Ankle Pain  . Emesis    Continues to have dysuria and vomiting. Right flank pain  REVIEW OF SYSTEMS:    Review of Systems  Constitutional: Positive for fever, chills and malaise/fatigue.  HENT: Negative for sore throat.   Eyes: Negative for blurred vision, double vision and pain.  Respiratory: Negative for cough, hemoptysis, shortness of breath and wheezing.   Cardiovascular: Negative for chest pain, palpitations, orthopnea and leg swelling.  Gastrointestinal: Positive for nausea and vomiting. Negative for heartburn, abdominal pain, diarrhea and constipation.  Genitourinary: Positive for dysuria. Negative for hematuria.  Musculoskeletal: Negative for back pain and joint pain.  Skin: Negative for rash.  Neurological: Positive for weakness. Negative for sensory change, speech change, focal weakness and headaches.  Endo/Heme/Allergies: Does not bruise/bleed easily.  Psychiatric/Behavioral: Negative for depression. The patient is not nervous/anxious.       DRUG ALLERGIES:   Allergies  Allergen Reactions  . Prednisone     Feet swelling     VITALS:  Blood pressure 133/82, pulse 100, temperature 99.5 F (37.5 C), temperature source Oral, resp. rate 18, height  (1.651 m), weight 83.144 kg (183 lb 4.8 oz), last menstrual period 12/05/2015, SpO2 99 %.  PHYSICAL EXAMINATION:   Physical Exam  GENERAL:  44 y.o.-year-old patient lying in the bed with no acute distress.  EYES: Pupils equal, round, reactive to light and accommodation. No scleral icterus. Extraocular muscles intact.  HEENT: Head atraumatic, normocephalic. Oropharynx and nasopharynx clear.  NECK:  Supple, no jugular venous distention. No  thyroid enlargement, no tenderness.  LUNGS: Normal breath sounds bilaterally, no wheezing, rales, rhonchi. No use of accessory muscles of respiration.  CARDIOVASCULAR: S1, S2 normal. No murmurs, rubs, or gallops.  ABDOMEN: Soft, nondistended. Bowel sounds present. No organomegaly or mass. Tender right EXTREMITIES: No cyanosis, clubbing or edema b/l.    NEUROLOGIC: Cranial nerves II through XII are intact. No focal Motor or sensory deficits b/l.   PSYCHIATRIC: The patient is alert and oriented x 3.  SKIN: No obvious rash, lesion, or ulcer.    LABORATORY PANEL:   CBC  Recent Labs Lab 12/19/15 1101  WBC 8.3  HGB 16.7*  HCT 48.8*  PLT 83*   ------------------------------------------------------------------------------------------------------------------  Chemistries   Recent Labs Lab 12/19/15 1101  NA 130*  K 3.6  CL 91*  CO2 24  GLUCOSE 143*  BUN 9  CREATININE 0.71  CALCIUM 9.5  AST 78*  ALT 61*  ALKPHOS 88  BILITOT 1.1   ------------------------------------------------------------------------------------------------------------------  Cardiac Enzymes No results for input(s): TROPONINI in the last 168 hours. ------------------------------------------------------------------------------------------------------------------  RADIOLOGY:  Dg Chest 2 View  12/19/2015  CLINICAL DATA:  Mild wheezing, chest pain and shortness of breath for 2 weeks. Initial encounter. EXAM: CHEST  2 VIEW COMPARISON:  None. FINDINGS: The lungs are clear. Heart size is normal. There is no pneumothorax or pleural effusion. No focal bony abnormality. IMPRESSION: No acute disease. Electronically Signed   By: Drusilla Kanner M.D.   On: 12/19/2015 14:28   Ct Renal Stone Study  12/19/2015  CLINICAL DATA:  Right lower quadrant abdominal pain, dysuria and hematuria EXAM: CT ABDOMEN AND PELVIS WITHOUT CONTRAST  TECHNIQUE: Multidetector CT imaging of the abdomen and pelvis was performed following the  standard protocol without IV contrast. COMPARISON:  None. FINDINGS: Lower chest:  No acute findings. Hepatobiliary: Diffuse hypoattenuation of the liver parenchyma compatible with hepatic steatosis or fatty infiltration. No biliary dilatation. Gallbladder and biliary system unremarkable. Pancreas: No mass or inflammatory process identified on this un-enhanced exam. Spleen: Within normal limits in size. Adrenals/Urinary Tract: Normal adrenal glands. Left kidney and ureter demonstrate no acute obstruction, hydronephrosis, or ureteral calculus. Right kidney demonstrates slight malrotation and surrounding perinephric strandy edema and inflammation diffusely. No associated hydronephrosis are visualized obstructing ureteral calculus. This may be related to recent stone passage versus acute right pyelonephritis. Stomach/Bowel: Negative for bowel obstruction, dilatation, ileus, or free air. Normal appendix demonstrated. No fluid collection or abscess. Vascular/Lymphatic: No adenopathy. Minor aortic atherosclerosis without aneurysm. No retroperitoneal hemorrhage. Reproductive: No mass or other significant abnormality. Other: Intact abdominal wall.  No inguinal abnormality or hernia. Musculoskeletal:  no acute or abnormal osseous finding. IMPRESSION: Right perinephric strandy edema and inflammation without hydronephrosis or obstructing urinary tract calculus. This could be secondary to ascending urinary tract infection or pyelonephritis versus recent stone passage. Hepatic steatosis No other acute intra-abdominal or pelvic finding.  Normal appendix. Electronically Signed   By: Judie PetitM.  Shick M.D.   On: 12/19/2015 14:17     ASSESSMENT AND PLAN:   This is a 44 year old Caucasian female admitted for sepsis secondary to pyelonephritis.   1. Sepsis: Due to pyelonephritis  2. Pyelonephritis:  On IV abx. Await cx results. No hydronephrosis  3. Alcohol abuse:  CIWA  4. Tobacco abuse: NicoDerm patch Counseled >  3minutes  5. Cough: Likely bronchitic. Supportive care.  6. Depression: Continue trazodone, BuSpar and Prozac  7. DVT prophylaxis: Heparin   All the records are reviewed and case discussed with Care Management/Social Workerr. Management plans discussed with the patient, family and they are in agreement.  CODE STATUS: FULL  DVT Prophylaxis: SCDs  TOTAL TIME TAKING CARE OF THIS PATIENT: 35 minutes.   POSSIBLE D/C IN 2-3 DAYS, DEPENDING ON CLINICAL CONDITION.   Milagros LollSudini, Deysha Cartier R M.D on 12/20/2015 at 3:26 PM  Between 7am to 6pm - Pager - [phone]  After 6pm go to [email or web address] - password [email or web address] [facility]  [facility]  Office  [phone]  CC: Primary care physician; No primary care provider on file.    Note: This dictation was prepared with Dragon dictation along with smaller phrase technology. Any transcriptional errors that result from this process are unintentional.

## 2015-12-21 LAB — CBC WITH DIFFERENTIAL/PLATELET
BASOS ABS: 0 10*3/uL (ref 0–0.1)
Basophils Relative: 0 %
EOS ABS: 0 10*3/uL (ref 0–0.7)
HCT: 39.3 % (ref 35.0–47.0)
Hemoglobin: 13.4 g/dL (ref 12.0–16.0)
LYMPHS ABS: 0.6 10*3/uL — AB (ref 1.0–3.6)
Lymphocytes Relative: 13 %
MCH: 37.2 pg — AB (ref 26.0–34.0)
MCHC: 34.2 g/dL (ref 32.0–36.0)
MCV: 108.5 fL — AB (ref 80.0–100.0)
MONO ABS: 0.5 10*3/uL (ref 0.2–0.9)
Monocytes Relative: 13 %
Neutro Abs: 3.2 10*3/uL (ref 1.4–6.5)
Neutrophils Relative %: 74 %
PLATELETS: 55 10*3/uL — AB (ref 150–440)
RBC: 3.62 MIL/uL — AB (ref 3.80–5.20)
RDW: 14 % (ref 11.5–14.5)
WBC: 4.3 10*3/uL (ref 3.6–11.0)

## 2015-12-21 LAB — GLUCOSE, CAPILLARY
Glucose-Capillary: 92 mg/dL (ref 65–99)
Glucose-Capillary: 97 mg/dL (ref 65–99)

## 2015-12-21 LAB — BASIC METABOLIC PANEL
Anion gap: 9 (ref 5–15)
BUN: 8 mg/dL (ref 6–20)
CALCIUM: 8.2 mg/dL — AB (ref 8.9–10.3)
CO2: 24 mmol/L (ref 22–32)
Chloride: 100 mmol/L — ABNORMAL LOW (ref 101–111)
Creatinine, Ser: 0.85 mg/dL (ref 0.44–1.00)
GFR calc Af Amer: >60 mL/min (ref >60)
GLUCOSE: 92 mg/dL (ref 65–99)
POTASSIUM: 3.6 mmol/L (ref 3.5–5.1)
SODIUM: 133 mmol/L — AB (ref 135–145)

## 2015-12-21 MED ORDER — METOPROLOL TARTRATE 50 MG PO TABS
50.0000 mg | ORAL_TABLET | Freq: Two times a day (BID) | ORAL | Status: DC
  Administered 2015-12-21 – 2015-12-22 (×4): 50 mg via ORAL
  Filled 2015-12-21 (×4): qty 1

## 2015-12-21 MED ORDER — CLONIDINE HCL 0.1 MG PO TABS
0.2000 mg | ORAL_TABLET | Freq: Once | ORAL | Status: AC
  Administered 2015-12-21: 0.2 mg via ORAL
  Filled 2015-12-21: qty 2

## 2015-12-21 MED ORDER — OXYCODONE HCL 5 MG PO TABS
5.0000 mg | ORAL_TABLET | ORAL | Status: DC | PRN
  Administered 2015-12-21 – 2015-12-22 (×3): 5 mg via ORAL
  Filled 2015-12-21 (×3): qty 1

## 2015-12-21 NOTE — Progress Notes (Signed)
12/21/2015 01:32  Nurse tech Anda KraftErica Trogdon re-checked pt's temperature and found it to be 100.6 after administration of 650mg  P.O. Tylenol.  Will continue to monitor and assess.  Bradly Chrisougherty, Kelsea Mousel E, RN

## 2015-12-21 NOTE — Progress Notes (Signed)
12/21/2015 6:34 AM  Notified MD of elevated BP reading 141/112.  Notified Dr.who ordered one time dose of Clonidine PO 0.2mg .  Will administer and recheck BP after appropriate time has passed.  Bradly Chrisougherty, Aliany Fiorenza E, RN

## 2015-12-21 NOTE — Progress Notes (Signed)
12/21/2015  00:04  Nurse tech Anda KraftErica Trogdon reported pt temperature of 101.4.  Administered 650mg  Tylenol P.O. And will recheck temperature.  Bradly Chrisougherty, Aliesha Dolata E, RN

## 2015-12-21 NOTE — Progress Notes (Signed)
[facility] - [place] at [facility]   PATIENT NAME: Anna Lucas    MR#:  [id number]  DATE OF BIRTH:  1971-02-05  SUBJECTIVE:  CHIEF COMPLAINT:   Chief Complaint  Patient presents with  . Cough  . Fever  . Abdominal Pain    RLQ  . Dysuria  . Ankle Pain  . Emesis    Continues to have dysuria and nausea. Vomiting resolved Right flank pain same. Tmax 101  REVIEW OF SYSTEMS:    Review of Systems  Constitutional: Positive for fever, chills and malaise/fatigue.  HENT: Negative for sore throat.   Eyes: Negative for blurred vision, double vision and pain.  Respiratory: Negative for cough, hemoptysis, shortness of breath and wheezing.   Cardiovascular: Negative for chest pain, palpitations, orthopnea and leg swelling.  Gastrointestinal: Positive for nausea and vomiting. Negative for heartburn, abdominal pain, diarrhea and constipation.  Genitourinary: Positive for dysuria. Negative for hematuria.  Musculoskeletal: Negative for back pain and joint pain.  Skin: Negative for rash.  Neurological: Positive for weakness. Negative for sensory change, speech change, focal weakness and headaches.  Endo/Heme/Allergies: Does not bruise/bleed easily.  Psychiatric/Behavioral: Negative for depression. The patient is not nervous/anxious.       DRUG ALLERGIES:   Allergies  Allergen Reactions  . Prednisone     Feet swelling     VITALS:  Blood pressure 155/102, pulse 87, temperature 98.8 F (37.1 C), temperature source Oral, resp. rate 17, height  (1.651 m), weight 81.784 kg (180 lb 4.8 oz), last menstrual period 12/05/2015, SpO2 97 %.  PHYSICAL EXAMINATION:   Physical Exam  GENERAL:  44 y.o.-year-old patient lying in the bed with no acute distress.  EYES: Pupils equal, round, reactive to light and accommodation. No scleral icterus. Extraocular muscles intact.  HEENT: Head atraumatic, normocephalic. Oropharynx and nasopharynx clear.  NECK:  Supple, no  jugular venous distention. No thyroid enlargement, no tenderness.  LUNGS: Normal breath sounds bilaterally, no wheezing, rales, rhonchi. No use of accessory muscles of respiration.  CARDIOVASCULAR: S1, S2 normal. No murmurs, rubs, or gallops.  ABDOMEN: Soft, nondistended. Bowel sounds present. No organomegaly or mass. Tender right EXTREMITIES: No cyanosis, clubbing or edema b/l.    NEUROLOGIC: Cranial nerves II through XII are intact. No focal Motor or sensory deficits b/l.   PSYCHIATRIC: The patient is alert and oriented x 3.  SKIN: No obvious rash, lesion, or ulcer.    LABORATORY PANEL:   CBC  Recent Labs Lab 12/21/15 0603  WBC 4.3  HGB 13.4  HCT 39.3  PLT 55*   ------------------------------------------------------------------------------------------------------------------  Chemistries   Recent Labs Lab 12/19/15 1101 12/21/15 0603  NA 130* 133*  K 3.6 3.6  CL 91* 100*  CO2 24 24  GLUCOSE 143* 92  BUN 9 8  CREATININE 0.71 0.85  CALCIUM 9.5 8.2*  AST 78*  --   ALT 61*  --   ALKPHOS 88  --   BILITOT 1.1  --    ------------------------------------------------------------------------------------------------------------------  Cardiac Enzymes No results for input(s): TROPONINI in the last 168 hours. ------------------------------------------------------------------------------------------------------------------  RADIOLOGY:  Dg Chest 2 View  12/19/2015  CLINICAL DATA:  Mild wheezing, chest pain and shortness of breath for 2 weeks. Initial encounter. EXAM: CHEST  2 VIEW COMPARISON:  None. FINDINGS: The lungs are clear. Heart size is normal. There is no pneumothorax or pleural effusion. No focal bony abnormality. IMPRESSION: No acute disease. Electronically Signed   By: Drusilla Kanner M.D.   On: 12/19/2015 14:28  Ct Renal Stone Study  12/19/2015  CLINICAL DATA:  Right lower quadrant abdominal pain, dysuria and hematuria EXAM: CT ABDOMEN AND PELVIS WITHOUT  CONTRAST TECHNIQUE: Multidetector CT imaging of the abdomen and pelvis was performed following the standard protocol without IV contrast. COMPARISON:  None. FINDINGS: Lower chest:  No acute findings. Hepatobiliary: Diffuse hypoattenuation of the liver parenchyma compatible with hepatic steatosis or fatty infiltration. No biliary dilatation. Gallbladder and biliary system unremarkable. Pancreas: No mass or inflammatory process identified on this un-enhanced exam. Spleen: Within normal limits in size. Adrenals/Urinary Tract: Normal adrenal glands. Left kidney and ureter demonstrate no acute obstruction, hydronephrosis, or ureteral calculus. Right kidney demonstrates slight malrotation and surrounding perinephric strandy edema and inflammation diffusely. No associated hydronephrosis are visualized obstructing ureteral calculus. This may be related to recent stone passage versus acute right pyelonephritis. Stomach/Bowel: Negative for bowel obstruction, dilatation, ileus, or free air. Normal appendix demonstrated. No fluid collection or abscess. Vascular/Lymphatic: No adenopathy. Minor aortic atherosclerosis without aneurysm. No retroperitoneal hemorrhage. Reproductive: No mass or other significant abnormality. Other: Intact abdominal wall.  No inguinal abnormality or hernia. Musculoskeletal:  no acute or abnormal osseous finding. IMPRESSION: Right perinephric strandy edema and inflammation without hydronephrosis or obstructing urinary tract calculus. This could be secondary to ascending urinary tract infection or pyelonephritis versus recent stone passage. Hepatic steatosis No other acute intra-abdominal or pelvic finding.  Normal appendix. Electronically Signed   By: Judie PetitM.  Shick M.D.   On: 12/19/2015 14:17     ASSESSMENT AND PLAN:   This is a 44 year old Caucasian female admitted for sepsis secondary to pyelonephritis.   1. Sepsis: Due to pyelonephritis  2. Pyelonephritis:  On IV abx. Await cx results. No  hydronephrosis  3. Alcohol abuse:  CIWA  4. Tobacco abuse: NicoDerm patch Counseled  5. Cough: Likely bronchitic. Supportive care.  6. Depression: Continue trazodone, BuSpar and Prozac  7. DVT prophylaxis: Heparin   All the records are reviewed and case discussed with Care Management/Social Workerr. Management plans discussed with the patient, family and they are in agreement.  CODE STATUS: FULL  DVT Prophylaxis: SCDs  TOTAL TIME TAKING CARE OF THIS PATIENT: 35 minutes.   POSSIBLE D/C IN 2-3 DAYS, DEPENDING ON CLINICAL CONDITION.   Milagros LollSudini, Jalene Demo R M.D on 12/21/2015 at 1:27 PM  Between 7am to 6pm - Pager - [phone]  After 6pm go to [email or web address] - password [email or web address] [email or web address]  [facility]  Office  [phone]  CC: Primary care physician; No primary care provider on file.    Note: This dictation was prepared with Dragon dictation along with smaller phrase technology. Any transcriptional errors that result from this process are unintentional.

## 2015-12-22 LAB — URINE CULTURE
Culture: >=100000
Special Requests: NORMAL

## 2015-12-22 MED ORDER — ENSURE ENLIVE PO LIQD: 237.0000 mL | Freq: Two times a day (BID) | ORAL | Status: DC

## 2015-12-22 MED ORDER — FLUCONAZOLE 100 MG PO TABS
200.0000 mg | ORAL_TABLET | Freq: Once | ORAL | Status: AC
  Administered 2015-12-22: 200 mg via ORAL
  Filled 2015-12-22: qty 2

## 2015-12-22 MED ORDER — CIPROFLOXACIN IN D5W 400 MG/200ML IV SOLN
400.0000 mg | Freq: Two times a day (BID) | INTRAVENOUS | Status: DC
  Administered 2015-12-22: 400 mg via INTRAVENOUS
  Filled 2015-12-22 (×3): qty 200

## 2015-12-22 NOTE — Progress Notes (Signed)
[facility] - [place] at [facility]   PATIENT NAME: Anna Lucas    MR#:  [id number]  DATE OF BIRTH:  [birth year]  SUBJECTIVE:  CHIEF COMPLAINT:   Chief Complaint  Patient presents with  . Cough  . Fever  . Abdominal Pain    RLQ  . Dysuria  . Ankle Pain  . Emesis    Continues to have dysuria and nausea. Vomiting resolved. Overall feels better Right flank pain improving. Tmax 99.5  REVIEW OF SYSTEMS:    Review of Systems  Constitutional: Positive for fever, chills and malaise/fatigue.  HENT: Negative for sore throat.   Eyes: Negative for blurred vision, double vision and pain.  Respiratory: Negative for cough, hemoptysis, shortness of breath and wheezing.   Cardiovascular: Negative for chest pain, palpitations, orthopnea and leg swelling.  Gastrointestinal: Positive for nausea and vomiting. Negative for heartburn, abdominal pain, diarrhea and constipation.  Genitourinary: Positive for dysuria. Negative for hematuria.  Musculoskeletal: Negative for back pain and joint pain.  Skin: Negative for rash.  Neurological: Positive for weakness. Negative for sensory change, speech change, focal weakness and headaches.  Endo/Heme/Allergies: Does not bruise/bleed easily.  Psychiatric/Behavioral: Negative for depression. The patient is not nervous/anxious.       DRUG ALLERGIES:   Allergies  Allergen Reactions  . Prednisone     Feet swelling     VITALS:  Blood pressure 124/86, pulse 91, temperature 99.5 F (37.5 C), temperature source Oral, resp. rate 16, height  (1.651 m), weight 83.825 kg (184 lb 12.8 oz), last menstrual period 12/05/2015, SpO2 94 %.  PHYSICAL EXAMINATION:   Physical Exam  GENERAL:  44 y.o.-year-old patient lying in the bed with no acute distress.  EYES: Pupils equal, round, reactive to light and accommodation. No scleral icterus. Extraocular muscles intact.  HEENT: Head atraumatic, normocephalic. Oropharynx and  nasopharynx clear.  NECK:  Supple, no jugular venous distention. No thyroid enlargement, no tenderness.  LUNGS: Normal breath sounds bilaterally, no wheezing, rales, rhonchi. No use of accessory muscles of respiration.  CARDIOVASCULAR: S1, S2 normal. No murmurs, rubs, or gallops.  ABDOMEN: Soft, nondistended. Bowel sounds present. No organomegaly or mass. Tender right EXTREMITIES: No cyanosis, clubbing or edema b/l.    NEUROLOGIC: Cranial nerves II through XII are intact. No focal Motor or sensory deficits b/l.   PSYCHIATRIC: The patient is alert and oriented x 3.  SKIN: No obvious rash, lesion, or ulcer.    LABORATORY PANEL:   CBC  Recent Labs Lab 12/21/15 0603  WBC 4.3  HGB 13.4  HCT 39.3  PLT 55*   ------------------------------------------------------------------------------------------------------------------  Chemistries   Recent Labs Lab 12/19/15 1101 12/21/15 0603  NA 130* 133*  K 3.6 3.6  CL 91* 100*  CO2 24 24  GLUCOSE 143* 92  BUN 9 8  CREATININE 0.71 0.85  CALCIUM 9.5 8.2*  AST 78*  --   ALT 61*  --   ALKPHOS 88  --   BILITOT 1.1  --    ------------------------------------------------------------------------------------------------------------------  Cardiac Enzymes No results for input(s): TROPONINI in the last 168 hours. ------------------------------------------------------------------------------------------------------------------  RADIOLOGY:  No results found.   ASSESSMENT AND PLAN:   This is a 44 year old Caucasian female admitted for sepsis secondary to pyelonephritis.   1. Sepsis: Due to pyelonephritis - resolved  2. Pyelonephritis: Ucx have pansensitive E Coli On IV abx. No hydronephrosis  3. Alcohol abuse:  CIWA  4. Tobacco abuse: NicoDerm patch Counseled  5. Cough: Likely bronchitic. Supportive care.  6.  Depression: Continue trazodone, BuSpar and Prozac  7. DVT prophylaxis: Heparin stopped due to thrombocytopenia  8.  Thrombocytopenia due to alcohol  Likely d/c in AM  Patients mother mentions that patient is predisposed to drug abuse. Has friends who have called enquiring what pain meds she will get prescriptions for at discharge.  Would avoid narcotics at discharge. If needed shortest period possible.  All the records are reviewed and case discussed with Care Management/Social Workerr. Management plans discussed with the patient, family and they are in agreement.  CODE STATUS: FULL  DVT Prophylaxis: SCDs  TOTAL TIME TAKING CARE OF THIS PATIENT: 35 minutes.   POSSIBLE D/C IN 2-3 DAYS, DEPENDING ON CLINICAL CONDITION.     Milagros LollSudini, Ji Feldner R M.D on 12/22/2015 at 1:03 PM  Between 7am to 6pm - Pager - [phone]  After 6pm go to [email or web address] - password [email or web address] [facility]  [facility]  Office  [phone]  CC: Primary care physician; No primary care provider on file.    Note: This dictation was prepared with Dragon dictation along with smaller phrase technology. Any transcriptional errors that result from this process are unintentional.

## 2015-12-22 NOTE — Progress Notes (Signed)
ANTIBIOTIC CONSULT NOTE - INITIAL  Pharmacy Consult for Ciprofloxacin Indication: pyelonephritis  Allergies  Allergen Reactions  . Prednisone     Feet swelling     Patient Measurements: Height:  (165.1 cm) Weight: 184 lb 12.8 oz (83.825 kg) IBW/kg (Calculated) : 57 Adjusted Body Weight:   Vital Signs: Temp: 99.5 F (37.5 C) (12/29 1233) Temp Source: Oral (12/29 1233) BP: 124/86 mmHg (12/29 1233) Pulse Rate: 91 (12/29 1233) Intake/Output from previous day: 12/28 0701 - 12/29 0700 In: 3274.1 [P.O.:1480; I.V.:1744.1; IV Piggyback:50] Out: -  Intake/Output from this shift: Total I/O In: -  Out: 600 [Urine:600]  Labs:  Recent Labs  12/21/15 0603  WBC 4.3  HGB 13.4  PLT 55*  CREATININE 0.85   Estimated Creatinine Clearance: 92.1 mL/min (by C-G formula based on Cr of 0.85). No results for input(s): VANCOTROUGH, VANCOPEAK, VANCORANDOM, GENTTROUGH, GENTPEAK, GENTRANDOM, TOBRATROUGH, TOBRAPEAK, TOBRARND, AMIKACINPEAK, AMIKACINTROU, AMIKACIN in the last 72 hours.   Microbiology: Recent Results (from the past 720 hour(s))  Urine culture     Status: None   Collection Time: 12/19/15 11:30 AM  Result Value Ref Range Status   Specimen Description URINE, CLEAN CATCH  Final   Special Requests Normal  Final   Culture >=100,000 COLONIES/mL ESCHERICHIA COLI  Final   Report Status 12/22/2015 FINAL  Final   Organism ID, Bacteria ESCHERICHIA COLI  Final      Susceptibility   Escherichia coli - MIC*    AMPICILLIN <=2 SENSITIVE Sensitive     CEFAZOLIN <=4 SENSITIVE Sensitive     CEFTRIAXONE <=1 SENSITIVE Sensitive     CIPROFLOXACIN <=0.25 SENSITIVE Sensitive     GENTAMICIN <=1 SENSITIVE Sensitive     IMIPENEM <=0.25 SENSITIVE Sensitive     NITROFURANTOIN <=16 SENSITIVE Sensitive     TRIMETH/SULFA <=20 SENSITIVE Sensitive     Extended ESBL NEGATIVE Sensitive     PIP/TAZO Value in next row Sensitive      SENSITIVE<=4    ERTAPENEM Value in next row Sensitive    SENSITIVE<=0.5    LEVOFLOXACIN Value in next row Sensitive      SENSITIVE<=16    * >=100,000 COLONIES/mL ESCHERICHIA COLI  CULTURE, BLOOD (ROUTINE X 2) w Reflex to PCR ID Panel     Status: None (Preliminary result)   Collection Time: 12/19/15  4:10 PM  Result Value Ref Range Status   Specimen Description BLOOD LEFT ASSIST CONTROL  Final   Special Requests BOTTLES DRAWN AEROBIC AND ANAEROBIC 2CC  Final   Culture NO GROWTH 3 DAYS  Final   Report Status PENDING  Incomplete  CULTURE, BLOOD (ROUTINE X 2) w Reflex to PCR ID Panel     Status: None (Preliminary result)   Collection Time: 12/19/15  4:10 PM  Result Value Ref Range Status   Specimen Description BLOOD RIGHT ASSIST CONTROL  Final   Special Requests NONE  Final   Culture NO GROWTH 3 DAYS  Final   Report Status PENDING  Incomplete    Medical History: Past Medical History  Diagnosis Date  . Depression   . Alcohol abuse     Medications:  Prescriptions prior to admission  Medication Sig Dispense Refill Last Dose  . busPIRone (BUSPAR) 10 MG tablet Take 20 mg by mouth at bedtime.   12/18/2015 at Unknown time  . FLUoxetine (PROZAC) 10 MG capsule Take 20 mg by mouth daily.    12/18/2015 at Unknown time  . traZODone (DESYREL) 50 MG tablet Take 100 mg by mouth at  bedtime.   12/18/2015 at Unknown time   Assessment: CrCl = 92.1 ml/min   Goal of Therapy:  resolution of infection   Plan:  Expected duration 7 days with resolution of temperature and/or normalization of WBC   Ciprofloxacin 400 mg IV Q24H originally ordered.  Will adjust dose to ciprofloxacin 400 mg IV Q12H based on CrCl > 30 ml/min.  Meri Pelot D 12/22/2015,5:17 PM

## 2015-12-22 NOTE — Plan of Care (Signed)
Problem: Safety: Goal: Ability to remain free from injury will improve Outcome: Not Progressing Pt jumps OOB without calling for assistance

## 2015-12-23 LAB — CBC WITH DIFFERENTIAL/PLATELET
Basophils Absolute: 0 K/uL (ref 0–0.1)
Basophils Relative: 0 %
Eosinophils Absolute: 0.1 K/uL (ref 0–0.7)
Eosinophils Relative: 1 %
HCT: 36.6 % (ref 35.0–47.0)
Hemoglobin: 12.4 g/dL (ref 12.0–16.0)
Lymphocytes Relative: 14 %
Lymphs Abs: 0.9 K/uL — ABNORMAL LOW (ref 1.0–3.6)
MCH: 35.7 pg — ABNORMAL HIGH (ref 26.0–34.0)
MCHC: 33.9 g/dL (ref 32.0–36.0)
MCV: 105.3 fL — ABNORMAL HIGH (ref 80.0–100.0)
Monocytes Absolute: 1.1 K/uL — ABNORMAL HIGH (ref 0.2–0.9)
Monocytes Relative: 17 %
Neutro Abs: 4.3 K/uL (ref 1.4–6.5)
Neutrophils Relative %: 68 %
Platelets: 92 K/uL — ABNORMAL LOW (ref 150–440)
RBC: 3.48 MIL/uL — ABNORMAL LOW (ref 3.80–5.20)
RDW: 13.9 % (ref 11.5–14.5)
WBC: 6.5 K/uL (ref 3.6–11.0)

## 2015-12-23 MED ORDER — CIPROFLOXACIN HCL 500 MG PO TABS: 500.0000 mg | ORAL_TABLET | Freq: Two times a day (BID) | ORAL | Status: DC

## 2015-12-23 MED ORDER — OXYCODONE HCL 5 MG PO TABS: 5.0000 mg | ORAL_TABLET | Freq: Four times a day (QID) | ORAL | Status: DC | PRN

## 2015-12-23 MED ORDER — PERMETHRIN 5 % EX CREA
TOPICAL_CREAM | Freq: Once | CUTANEOUS | Status: DC
  Filled 2015-12-23: qty 60

## 2015-12-23 MED ORDER — METOPROLOL TARTRATE 50 MG PO TABS: 50.0000 mg | ORAL_TABLET | Freq: Two times a day (BID) | ORAL | Status: DC

## 2015-12-23 MED ORDER — PERMETHRIN 5 % EX CREA: TOPICAL_CREAM | Freq: Once | CUTANEOUS | Status: DC

## 2015-12-23 MED ORDER — ONDANSETRON HCL 4 MG PO TABS: 4.0000 mg | ORAL_TABLET | Freq: Four times a day (QID) | ORAL | Status: DC | PRN

## 2015-12-23 NOTE — Care Management (Signed)
Spoke with patient and mother Anna Bibleat. Patient is low income and has no insurance. Gave [facility] for [facility], [facility] dollar list. [facility] [facility] [place] along with copies of Rx for Zophran, Lopressor, Cipro. Gave original Rx to patient's mother who will be accompanying patient to pick up meds. [facility] was unable to fill Roxicodone and Elimite. [facility] Patient expressed understaning of importance of taking medications as directed. No other CM needs identified.

## 2015-12-23 NOTE — Progress Notes (Signed)
[facility]         [place], [place]   12/23/2015  Patient: Anna Lucas   Date of Birth:  07/05/1971  Date of admission:  12/19/2015  Date of Discharge  12/23/2015    To Whom it May Concern:   Anna Lucas  Can return to work on 12/26/2015 without restrictions.  If you have any questions or concerns, please don't hesitate to call.  Sincerely,   Milagros LollSudini, Delphina Schum R M.D Pager [phone]- [phone] Office : [phone]   .

## 2015-12-23 NOTE — Progress Notes (Signed)
12/23/2015 10:40 AM Anna FeilJoyce M Lucas to be D/C'd Home per MD order.  Discussed prescriptions and follow up appointments with the patient. Prescriptions given to patient, medication list explained in detail. Pt verbalized understanding.    Medication List    TAKE these medications        busPIRone 10 MG tablet  Commonly known as:  BUSPAR  Take 20 mg by mouth at bedtime.     ciprofloxacin 500 MG tablet  Commonly known as:  CIPRO  Take 1 tablet (500 mg total) by mouth 2 (two) times daily.     FLUoxetine 10 MG capsule  Commonly known as:  PROZAC  Take 20 mg by mouth daily.     metoprolol 50 MG tablet  Commonly known as:  LOPRESSOR  Take 1 tablet (50 mg total) by mouth 2 (two) times daily.     ondansetron 4 MG tablet  Commonly known as:  ZOFRAN  Take 1 tablet (4 mg total) by mouth every 6 (six) hours as needed for nausea.     oxyCODONE 5 MG immediate release tablet  Commonly known as:  Oxy IR/ROXICODONE  Take 1 tablet (5 mg total) by mouth every 6 (six) hours as needed for moderate pain or severe pain.     permethrin 5 % cream  Commonly known as:  ELIMITE  Apply topically once.     traZODone 50 MG tablet  Commonly known as:  DESYREL  Take 100 mg by mouth at bedtime.        Filed Vitals:   12/22/15 2021 12/23/15 0612  BP: 138/93 127/88  Pulse: 93 87  Temp: 98.6 F (37 C) 98.4 F (36.9 C)  Resp: 18 18    Skin clean, dry and intact without evidence of skin break down, no evidence of skin tears noted. IV catheter discontinued intact. Site without signs and symptoms of complications. Dressing and pressure applied. Pt denies pain at this time. No complaints noted.  An After Visit Summary was printed and given to the patient. Patient escorted via WC, and D/C home via private auto.  Bradly Chrisougherty, [name]

## 2015-12-23 NOTE — Discharge Instructions (Signed)
°  DIET:  Regular diet  DISCHARGE CONDITION:  Stable  ACTIVITY:  Activity as tolerated  OXYGEN:  Home Oxygen: No.   Oxygen Delivery: room air  DISCHARGE LOCATION:  home   If you experience worsening of your admission symptoms, develop shortness of breath, life threatening emergency, suicidal or homicidal thoughts you must seek medical attention immediately by calling 911 or calling your MD immediately  if symptoms less severe.  You Must read complete instructions/literature along with all the possible adverse reactions/side effects for all the Medicines you take and that have been prescribed to you. Take any new Medicines after you have completely understood and accpet all the possible adverse reactions/side effects.   Please note  You were cared for by a hospitalist during your hospital stay. If you have any questions about your discharge medications or the care you received while you were in the hospital after you are discharged, you can call the unit and asked to speak with the hospitalist on call if the hospitalist that took care of you is not available. Once you are discharged, your primary care physician will handle any further medical issues. Please note that NO REFILLS for any discharge medications will be authorized once you are discharged, as it is imperative that you return to your primary care physician (or establish a relationship with a primary care physician if you do not have one) for your aftercare needs so that they can reassess your need for medications and monitor your lab values.  Please QUIT ALCOHOL and SMOKING

## 2015-12-24 LAB — CULTURE, BLOOD (ROUTINE X 2)
Culture: NO GROWTH
Culture: NO GROWTH

## 2015-12-25 NOTE — Discharge Summary (Signed)
[place] at [place] NAME: Anna Lucas    MR#:  [id number]  DATE OF BIRTH:  09-11-1971  DATE OF ADMISSION:  12/19/2015 ADMITTING PHYSICIAN: Harrie Foreman, MD  DATE OF DISCHARGE: 12/23/2015 10:45 AM  PRIMARY CARE PHYSICIAN: No primary care provider on file.    ADMISSION DIAGNOSIS:  Viral syndrome [B34.9] Pyelonephritis [N12] Alcohol withdrawal, uncomplicated ([place]) [G99.242]  DISCHARGE DIAGNOSIS:  Active Problems:   Pyelonephritis   SECONDARY DIAGNOSIS:   Past Medical History  Diagnosis Date  . Depression   . Alcohol abuse      ADMITTING HISTORY  Chief Complaint: Vomiting HPI: The patient presents emergency department complaining of vomiting that began last night. She states it is nonbloody and nonbilious but that she has not been able to get any relief. She also complains of right flank pain as well as fevers. In the emergency department she was found to be febrile. Urinalysis showed infection which prompted emergency department staff to obtain the abdomen which showed pyelonephritis. The patient met sepsis criteria was found to the emergency department staff to call for admission.   HOSPITAL COURSE:   This is a 45 year old Caucasian female admitted for sepsis secondary to pyelonephritis.   1. Sepsis: Due to pyelonephritis - resolved  2. Pyelonephritis: Ucx have pansensitive E Coli On IV abx. No hydronephrosis Changed to PO Ciprofloxacin for 1 week at discharge. Blood Cx negative  3. Alcohol withdrawal CIWA Resolved  4. Tobacco abuse: NicoDerm patch Counseled  5. Cough: Likely bronchitic. Supportive care.  6. Depression: Continue trazodone, BuSpar and Prozac  7. DVT prophylaxis: Heparin stopped due to thrombocytopenia  8. Thrombocytopenia due to alcohol and sepsis - Improving. No bleeding  Stable for discharge home  permethrin script for Lice.  Work note given  CONSULTS OBTAINED:      DRUG ALLERGIES:   Allergies  Allergen Reactions  . Prednisone     Feet swelling     DISCHARGE MEDICATIONS:   Discharge Medication List as of 12/23/2015 10:21 AM    START taking these medications   Details  ciprofloxacin (CIPRO) 500 MG tablet Take 1 tablet (500 mg total) by mouth 2 (two) times daily., Starting 12/23/2015, Until Discontinued, Print    metoprolol (LOPRESSOR) 50 MG tablet Take 1 tablet (50 mg total) by mouth 2 (two) times daily., Starting 12/23/2015, Until Discontinued, Print    ondansetron (ZOFRAN) 4 MG tablet Take 1 tablet (4 mg total) by mouth every 6 (six) hours as needed for nausea., Starting 12/23/2015, Until Discontinued, Print    oxyCODONE (OXY IR/ROXICODONE) 5 MG immediate release tablet Take 1 tablet (5 mg total) by mouth every 6 (six) hours as needed for moderate pain or severe pain., Starting 12/23/2015, Until Discontinued, Print    permethrin (ELIMITE) 5 % cream Apply topically once., Starting 12/23/2015, Print      CONTINUE these medications which have NOT CHANGED   Details  busPIRone (BUSPAR) 10 MG tablet Take 20 mg by mouth at bedtime., Until Discontinued, Historical Med    FLUoxetine (PROZAC) 10 MG capsule Take 20 mg by mouth daily. , Until Discontinued, Historical Med    traZODone (DESYREL) 50 MG tablet Take 100 mg by mouth at bedtime., Until Discontinued, Historical Med         Today    VITAL SIGNS:  Blood pressure 127/88, pulse 87, temperature 98.4 F (36.9 C), temperature source Oral, resp. rate 18, height 5' 5"  (1.651 m), weight 83.825 kg (184 lb 12.8  oz), last menstrual period 12/05/2015, SpO2 94 %.  I/O:  No intake or output data in the 24 hours ending 12/25/15 0523  PHYSICAL EXAMINATION:  Physical Exam  GENERAL:  45 y.o.-year-old patient lying in the bed with no acute distress.  LUNGS: Normal breath sounds bilaterally, no wheezing, rales,rhonchi or crepitation. No use of accessory muscles of respiration.  CARDIOVASCULAR:  S1, S2 normal. No murmurs, rubs, or gallops.  ABDOMEN: Soft, non-tender, non-distended. Bowel sounds present. No organomegaly or mass.  NEUROLOGIC: Moves all 4 extremities. PSYCHIATRIC: The patient is alert and oriented x 3.  SKIN: No obvious rash, lesion, or ulcer.   DATA REVIEW:   CBC  Recent Labs Lab 12/23/15 0445  WBC 6.5  HGB 12.4  HCT 36.6  PLT 92*    Chemistries   Recent Labs Lab 12/19/15 1101 12/21/15 0603  NA 130* 133*  K 3.6 3.6  CL 91* 100*  CO2 24 24  GLUCOSE 143* 92  BUN 9 8  CREATININE 0.71 0.85  CALCIUM 9.5 8.2*  AST 78*  --   ALT 61*  --   ALKPHOS 88  --   BILITOT 1.1  --     Cardiac Enzymes No results for input(s): TROPONINI in the last 168 hours.  Microbiology Results  Results for orders placed or performed during the hospital encounter of 12/19/15  Urine culture     Status: None   Collection Time: 12/19/15 11:30 AM  Result Value Ref Range Status   Specimen Description URINE, CLEAN CATCH  Final   Special Requests Normal  Final   Culture >=100,000 COLONIES/mL ESCHERICHIA COLI  Final   Report Status 12/22/2015 FINAL  Final   Organism ID, Bacteria ESCHERICHIA COLI  Final      Susceptibility   Escherichia coli - MIC*    AMPICILLIN <=2 SENSITIVE Sensitive     CEFAZOLIN <=4 SENSITIVE Sensitive     CEFTRIAXONE <=1 SENSITIVE Sensitive     CIPROFLOXACIN <=0.25 SENSITIVE Sensitive     GENTAMICIN <=1 SENSITIVE Sensitive     IMIPENEM <=0.25 SENSITIVE Sensitive     NITROFURANTOIN <=16 SENSITIVE Sensitive     TRIMETH/SULFA <=20 SENSITIVE Sensitive     Extended ESBL NEGATIVE Sensitive     PIP/TAZO Value in next row Sensitive      SENSITIVE<=4    ERTAPENEM Value in next row Sensitive      SENSITIVE<=0.5    LEVOFLOXACIN Value in next row Sensitive      SENSITIVE<=16    * >=100,000 COLONIES/mL ESCHERICHIA COLI  CULTURE, BLOOD (ROUTINE X 2) w Reflex to PCR ID Panel     Status: None   Collection Time: 12/19/15  4:10 PM  Result Value Ref Range  Status   Specimen Description BLOOD LEFT ASSIST CONTROL  Final   Special Requests BOTTLES DRAWN AEROBIC AND ANAEROBIC 2CC  Final   Culture NO GROWTH 5 DAYS  Final   Report Status 12/24/2015 FINAL  Final  CULTURE, BLOOD (ROUTINE X 2) w Reflex to PCR ID Panel     Status: None   Collection Time: 12/19/15  4:10 PM  Result Value Ref Range Status   Specimen Description BLOOD RIGHT ASSIST CONTROL  Final   Special Requests NONE  Final   Culture NO GROWTH 5 DAYS  Final   Report Status 12/24/2015 FINAL  Final    RADIOLOGY:  No results found.    Follow up with PCP in 1 week.  Management plans discussed with the patient, family and they are  in agreement.  CODE STATUS:   TOTAL TIME TAKING CARE OF THIS PATIENT ON DAY OF DISCHARGE: more than 30 minutes.    Hillary Bow R M.D on 12/25/2015 at 5:23 AM  Between 7am to 6pm - Pager - [phone]  After 6pm go to [email or web address] - password [email or web address] [facility]  [place]  Office  [phone]  CC: Primary care physician; No primary care provider on file.     Note: This dictation was prepared with Dragon dictation along with smaller phrase technology. Any transcriptional errors that result from this process are unintentional.

## 2016-01-16 ENCOUNTER — Ambulatory Visit: Payer: Medicaid Other

## 2016-02-02 ENCOUNTER — Emergency Department
Admission: EM | Admit: 2016-02-02 | Discharge: 2016-02-02 | Disposition: A | Discharge status: Home / Self Care | Payer: Medicaid Other | Attending: Emergency Medicine | Admitting: Emergency Medicine

## 2016-02-02 ENCOUNTER — Emergency Department: Payer: Medicaid Other

## 2016-02-02 ENCOUNTER — Encounter: Payer: Self-pay | Admitting: Emergency Medicine

## 2016-02-02 DIAGNOSIS — J40 Bronchitis, not specified as acute or chronic: Secondary | ICD-10-CM | POA: Insufficient documentation

## 2016-02-02 DIAGNOSIS — F17210 Nicotine dependence, cigarettes, uncomplicated: Secondary | ICD-10-CM | POA: Insufficient documentation

## 2016-02-02 DIAGNOSIS — R109 Unspecified abdominal pain: Secondary | ICD-10-CM | POA: Insufficient documentation

## 2016-02-02 DIAGNOSIS — Z3202 Encounter for pregnancy test, result negative: Secondary | ICD-10-CM | POA: Insufficient documentation

## 2016-02-02 DIAGNOSIS — I10 Essential (primary) hypertension: Secondary | ICD-10-CM | POA: Insufficient documentation

## 2016-02-02 DIAGNOSIS — Z79899 Other long term (current) drug therapy: Secondary | ICD-10-CM | POA: Insufficient documentation

## 2016-02-02 HISTORY — DX: Anxiety disorder, unspecified: F41.9

## 2016-02-02 HISTORY — DX: Post-traumatic stress disorder, unspecified: F43.10

## 2016-02-02 HISTORY — DX: Essential (primary) hypertension: I10

## 2016-02-02 LAB — BASIC METABOLIC PANEL
ANION GAP: 13 (ref 5–15)
BUN: 8 mg/dL (ref 6–20)
CALCIUM: 9.3 mg/dL (ref 8.9–10.3)
CO2: 22 mmol/L (ref 22–32)
CREATININE: 0.68 mg/dL (ref 0.44–1.00)
Chloride: 103 mmol/L (ref 101–111)
GLUCOSE: 62 mg/dL — AB (ref 65–99)
Potassium: 3.6 mmol/L (ref 3.5–5.1)
Sodium: 138 mmol/L (ref 135–145)

## 2016-02-02 LAB — URINALYSIS COMPLETE WITH MICROSCOPIC (ARMC ONLY)
BACTERIA UA: NONE SEEN
Bilirubin Urine: NEGATIVE
Glucose, UA: 50 mg/dL — AB
LEUKOCYTES UA: NEGATIVE
NITRITE: NEGATIVE
PH: 5 (ref 5.0–8.0)
PROTEIN: NEGATIVE mg/dL
RBC / HPF: NONE SEEN RBC/hpf (ref 0–5)
SPECIFIC GRAVITY, URINE: 1.008 (ref 1.005–1.030)

## 2016-02-02 LAB — CBC
HCT: 43.9 % (ref 35.0–47.0)
HEMOGLOBIN: 15 g/dL (ref 12.0–16.0)
MCH: 36.5 pg — AB (ref 26.0–34.0)
MCHC: 34.1 g/dL (ref 32.0–36.0)
MCV: 107.1 fL — ABNORMAL HIGH (ref 80.0–100.0)
PLATELETS: 166 10*3/uL (ref 150–440)
RBC: 4.1 MIL/uL (ref 3.80–5.20)
RDW: 14.5 % (ref 11.5–14.5)
WBC: 7.5 10*3/uL (ref 3.6–11.0)

## 2016-02-02 LAB — POCT PREGNANCY, URINE: PREG TEST UR: NEGATIVE

## 2016-02-02 LAB — TROPONIN I: TROPONIN I: 0.03 ng/mL (ref <0.031)

## 2016-02-02 MED ORDER — ALBUTEROL SULFATE HFA 108 (90 BASE) MCG/ACT IN AERS: 2.0000 | INHALATION_SPRAY | Freq: Four times a day (QID) | RESPIRATORY_TRACT | Status: DC | PRN

## 2016-02-02 MED ORDER — IPRATROPIUM-ALBUTEROL 0.5-2.5 (3) MG/3ML IN SOLN
3.0000 mL | Freq: Once | RESPIRATORY_TRACT | Status: AC
  Administered 2016-02-02: 3 mL via RESPIRATORY_TRACT
  Filled 2016-02-02: qty 3

## 2016-02-02 NOTE — Discharge Instructions (Signed)
Please seek medical attention for any bloody cough, leg swelling, sharp chest pain, pain that is worse with deep breaths, shortness of breath, change in behavior, persistent vomiting, bloody stool or any other new or concerning symptoms.   Upper Respiratory Infection, Adult Most upper respiratory infections (URIs) are caused by a virus. A URI affects the nose, throat, and upper air passages. The most common type of URI is often called "the common cold." HOME CARE   Take medicines only as told by your doctor.  Gargle warm saltwater or take cough drops to comfort your throat as told by your doctor.  Use a warm mist humidifier or inhale steam from a shower to increase air moisture. This may make it easier to breathe.  Drink enough fluid to keep your pee (urine) clear or pale yellow.  Eat soups and other clear broths.  Have a healthy diet.  Rest as needed.  Go back to work when your fever is gone or your doctor says it is okay.  You may need to stay home longer to avoid giving your URI to others.  You can also wear a face mask and wash your hands often to prevent spread of the virus.  Use your inhaler more if you have asthma.  Do not use any tobacco products, including cigarettes, chewing tobacco, or electronic cigarettes. If you need help quitting, ask your doctor. GET HELP IF:  You are getting worse, not better.  Your symptoms are not helped by medicine.  You have chills.  You are getting more short of breath.  You have brown or red mucus.  You have yellow or brown discharge from your nose.  You have pain in your face, especially when you bend forward.  You have a fever.  You have puffy (swollen) neck glands.  You have pain while swallowing.  You have white areas in the back of your throat. GET HELP RIGHT AWAY IF:   You have very bad or constant:  Headache.  Ear pain.  Pain in your forehead, behind your eyes, and over your cheekbones (sinus pain).  Chest  pain.  You have long-lasting (chronic) lung disease and any of the following:  Wheezing.  Long-lasting cough.  Coughing up blood.  A change in your usual mucus.  You have a stiff neck.  You have changes in your:  Vision.  Hearing.  Thinking.  Mood. MAKE SURE YOU:   Understand these instructions.  Will watch your condition.  Will get help right away if you are not doing well or get worse.   This information is not intended to replace advice given to you by your health care provider. Make sure you discuss any questions you have with your health care provider.   Document Released: 05/28/2008 Document Revised: 04/26/2015 Document Reviewed: 03/17/2014 Elsevier Interactive Patient Education [employer]

## 2016-02-02 NOTE — ED Provider Notes (Signed)
[facility] Emergency Department Provider Note  ____________________________________________  Time seen: ~1230  I have reviewed the triage vital signs and the nursing notes.   HISTORY  Chief Complaint Chest Pain and Flank Pain   History limited by: Not Limited   HPI Anna Lucas is a 45 y.o. female who presents to the emergency department today because of concerns for episode of palpitations and shortness of breath this morning. The patient states that she has been feeling unwell for a week. She has had a cough for a week. She has felt lethargic. She has not noticed any fevers. She states this morning she started feeling like her heart was racing. This lasted for a couple of hours. During that time she also had associated shortness of breath. She had continued cough. She also states some left shoulder discomfort. She does have a history of panic attacks and states that this reminds her of her panic attacks in the past, however it lasted a little bit longer than normal. Patient states that she smokes. Did drink alcohol last night.     Past Medical History  Diagnosis Date  . Depression   . Alcohol abuse   . Hypertension   . Anxiety   . PTSD (post-traumatic stress disorder)     Patient Active Problem List   Diagnosis Date Noted  . Pyelonephritis 12/19/2015    Past Surgical History  Procedure Laterality Date  . Cesarean section      Current Outpatient Rx  Name  Route  Sig  Dispense  Refill  . busPIRone (BUSPAR) 10 MG tablet   Oral   Take 20 mg by mouth at bedtime.         Marland Kitchen FLUoxetine (PROZAC) 10 MG capsule   Oral   Take 20 mg by mouth daily.          . metoprolol (LOPRESSOR) 50 MG tablet   Oral   Take 1 tablet (50 mg total) by mouth 2 (two) times daily.   60 tablet   0   . traZODone (DESYREL) 50 MG tablet   Oral   Take 100 mg by mouth at bedtime.         . ciprofloxacin (CIPRO) 500 MG tablet   Oral   Take 1 tablet (500 mg  total) by mouth 2 (two) times daily. Patient not taking: Reported on 02/02/2016   14 tablet   0   . ondansetron (ZOFRAN) 4 MG tablet   Oral   Take 1 tablet (4 mg total) by mouth every 6 (six) hours as needed for nausea. Patient not taking: Reported on 02/02/2016   20 tablet   0   . oxyCODONE (OXY IR/ROXICODONE) 5 MG immediate release tablet   Oral   Take 1 tablet (5 mg total) by mouth every 6 (six) hours as needed for moderate pain or severe pain. Patient not taking: Reported on 02/02/2016   10 tablet   0   . permethrin (ELIMITE) 5 % cream   Topical   Apply topically once. Patient not taking: Reported on 02/02/2016   60 g   0     Allergies Prednisone  Family History  Problem Relation Age of Onset  . CAD Paternal Grandfather   . Stroke Paternal Grandfather   . Diabetes Mellitus II Maternal Grandmother   . Emphysema Maternal Grandmother     Social History Social History  Substance Use Topics  . Smoking status: Current Every Day Smoker -- 1.00 packs/day  Types: Cigarettes  . Smokeless tobacco: None  . Alcohol Use: Yes     Comment: occas    Review of Systems  Constitutional: Negative for fever. Cardiovascular: Negative for chest pain. Respiratory: Positive for shortness of breath. Gastrointestinal: Negative for abdominal pain, vomiting and diarrhea. Neurological: Negative for headaches, focal weakness or numbness.  10-point ROS otherwise negative.  ____________________________________________   PHYSICAL EXAM:  VITAL SIGNS: ED Triage Vitals  Enc Vitals Group     BP 02/02/16 1022 149/102 mmHg     Pulse Rate 02/02/16 1020 98     Resp 02/02/16 1020 20     Temp 02/02/16 1020 98.3 F (36.8 C)     Temp Source 02/02/16 1020 Oral     SpO2 02/02/16 1020 95 %     Weight 02/02/16 1020 165 lb (74.844 kg)     Height 02/02/16 1020  (1.651 m)     Head Cir --      Peak Flow --      Pain Score 02/02/16 1021 7   Constitutional: Alert and oriented. Well  appearing and in no distress. Eyes: Conjunctivae are normal. PERRL. Normal extraocular movements. ENT   Head: Normocephalic and atraumatic.   Nose: No congestion/rhinnorhea.   Mouth/Throat: Mucous membranes are moist.   Neck: No stridor. Hematological/Lymphatic/Immunilogical: No cervical lymphadenopathy. Cardiovascular: Normal rate, regular rhythm.  No murmurs, rubs, or gallops. Respiratory: Normal respiratory effort without tachypnea nor retractions. Diffuse bilateral expiratory wheezing. Occasional dry cough. Gastrointestinal: Soft and nontender. No distention.  Genitourinary: Deferred Musculoskeletal: Normal range of motion in all extremities. No joint effusions.  No lower extremity tenderness nor edema. Neurologic:  Normal speech and language. No gross focal neurologic deficits are appreciated.  Skin:  Skin is warm, dry and intact. No rash noted. Psychiatric: Mood and affect are normal. Speech and behavior are normal. Patient exhibits appropriate insight and judgment.  ____________________________________________    LABS (pertinent positives/negatives)  Labs Reviewed  CBC - Abnormal; Notable for the following:    MCV 107.1 (*)    MCH 36.5 (*)    All other components within normal limits  URINALYSIS COMPLETEWITH MICROSCOPIC ([facility] ONLY) - Abnormal; Notable for the following:    Color, Urine YELLOW (*)    APPearance CLEAR (*)    Glucose, UA 50 (*)    Ketones, ur TRACE (*)    Hgb urine dipstick 1+ (*)    Squamous Epithelial / LPF 0-5 (*)    All other components within normal limits  TROPONIN I  BASIC METABOLIC PANEL  POC URINE PREG, ED  POCT PREGNANCY, URINE     ____________________________________________   EKG  I, Phineas Semen, attending physician, personally viewed and interpreted this EKG  EKG Time: 1019 Rate: 105 Rhythm: sinus tachycardia Axis: normal Intervals: qtc 470 QRS: narrow, q waves V1 ST changes: no st elevation Impression:  abnormal ekg  ____________________________________________    RADIOLOGY  CXR  IMPRESSION: No active cardiopulmonary disease.  ____________________________________________   PROCEDURES  Procedure(s) performed: None  Critical Care performed: No  ____________________________________________   INITIAL IMPRESSION / ASSESSMENT AND PLAN / ED COURSE  Pertinent labs & imaging results that were available during my care of the patient were reviewed by me and considered in my medical decision making (see chart for details).  Patient presented to the emergency department today after an episode of heart racing, shortness of breath. She also had some concern for flank pain. Urine did not show any signs of kidney stones or infection. In  terms of the shortness of breath and heart racing she does have history of panic attacks and I think this is possible. In additional follow-up could be SVT given the alcohol use last night. Furthermore I do think is possible patient had worsening of her bronchitis. She does have bilateral wheezing and is a smoker. Additionally states that she has had a cough for at least the past week. I did discuss possibility of blood clots with patient given tachycardia on EKG however patient without any significant chest pain, is not hypoxic nor tachypneic at this point I do have concern that d-dimer would be elevated secondary to bronchitis. I think that the radiation risk of his CTA would outweigh the low risk for PE. I did encourage primary care follow-up and gave PE and DVT return precautions..  ____________________________________________   FINAL CLINICAL IMPRESSION(S) / ED DIAGNOSES  Final diagnoses:  Bronchitis     Phineas Semen, MD 02/02/16 1253

## 2016-02-02 NOTE — ED Notes (Signed)
Pt reports she felt like her heart was racing this morning, left shoulder pain and shortness of breath. Pt also reports left flank pain, foul smelling urine.

## 2016-08-08 ENCOUNTER — Ambulatory Visit: Payer: Medicaid Other | Attending: Internal Medicine

## 2016-08-17 ENCOUNTER — Emergency Department: Payer: Self-pay

## 2016-08-17 ENCOUNTER — Encounter: Payer: Self-pay | Admitting: Emergency Medicine

## 2016-08-17 ENCOUNTER — Inpatient Hospital Stay
Admission: EM | Admit: 2016-08-17 | Discharge: 2016-08-19 | DRG: 641 | Disposition: A | Discharge status: Home / Self Care | Payer: Self-pay | Attending: Internal Medicine | Admitting: Internal Medicine

## 2016-08-17 ENCOUNTER — Other Ambulatory Visit: Payer: Self-pay

## 2016-08-17 DIAGNOSIS — I10 Essential (primary) hypertension: Secondary | ICD-10-CM | POA: Diagnosis present

## 2016-08-17 DIAGNOSIS — F17210 Nicotine dependence, cigarettes, uncomplicated: Secondary | ICD-10-CM | POA: Diagnosis present

## 2016-08-17 DIAGNOSIS — R1110 Vomiting, unspecified: Secondary | ICD-10-CM | POA: Diagnosis present

## 2016-08-17 DIAGNOSIS — R319 Hematuria, unspecified: Secondary | ICD-10-CM | POA: Diagnosis present

## 2016-08-17 DIAGNOSIS — F10129 Alcohol abuse with intoxication, unspecified: Secondary | ICD-10-CM | POA: Diagnosis present

## 2016-08-17 DIAGNOSIS — Z8249 Family history of ischemic heart disease and other diseases of the circulatory system: Secondary | ICD-10-CM

## 2016-08-17 DIAGNOSIS — F418 Other specified anxiety disorders: Secondary | ICD-10-CM | POA: Diagnosis present

## 2016-08-17 DIAGNOSIS — F1010 Alcohol abuse, uncomplicated: Secondary | ICD-10-CM

## 2016-08-17 DIAGNOSIS — F4310 Post-traumatic stress disorder, unspecified: Secondary | ICD-10-CM | POA: Diagnosis present

## 2016-08-17 DIAGNOSIS — K760 Fatty (change of) liver, not elsewhere classified: Secondary | ICD-10-CM | POA: Diagnosis present

## 2016-08-17 DIAGNOSIS — K5289 Other specified noninfective gastroenteritis and colitis: Secondary | ICD-10-CM | POA: Diagnosis present

## 2016-08-17 DIAGNOSIS — Z823 Family history of stroke: Secondary | ICD-10-CM

## 2016-08-17 DIAGNOSIS — Z833 Family history of diabetes mellitus: Secondary | ICD-10-CM

## 2016-08-17 DIAGNOSIS — E876 Hypokalemia: Principal | ICD-10-CM | POA: Diagnosis present

## 2016-08-17 DIAGNOSIS — Z825 Family history of asthma and other chronic lower respiratory diseases: Secondary | ICD-10-CM

## 2016-08-17 DIAGNOSIS — Z79899 Other long term (current) drug therapy: Secondary | ICD-10-CM

## 2016-08-17 LAB — POTASSIUM: POTASSIUM: 3.1 mmol/L — AB (ref 3.5–5.1)

## 2016-08-17 LAB — URINALYSIS COMPLETE WITH MICROSCOPIC (ARMC ONLY)
Glucose, UA: 100 mg/dL — AB
LEUKOCYTES UA: NEGATIVE
NITRITE: NEGATIVE
PH: 6 (ref 5.0–8.0)
PROTEIN: 100 mg/dL — AB

## 2016-08-17 LAB — COMPREHENSIVE METABOLIC PANEL
ALBUMIN: 4.3 g/dL (ref 3.5–5.0)
ALT: 83 U/L — ABNORMAL HIGH (ref 14–54)
ANION GAP: 17 — AB (ref 5–15)
AST: 194 U/L — ABNORMAL HIGH (ref 15–41)
Alkaline Phosphatase: 127 U/L — ABNORMAL HIGH (ref 38–126)
BILIRUBIN TOTAL: 3.1 mg/dL — AB (ref 0.3–1.2)
BUN: 8 mg/dL (ref 6–20)
CALCIUM: 9.6 mg/dL (ref 8.9–10.3)
CO2: 26 mmol/L (ref 22–32)
Chloride: 91 mmol/L — ABNORMAL LOW (ref 101–111)
Creatinine, Ser: 0.84 mg/dL (ref 0.44–1.00)
GLUCOSE: 131 mg/dL — AB (ref 65–99)
POTASSIUM: 2.9 mmol/L — AB (ref 3.5–5.1)
Sodium: 134 mmol/L — ABNORMAL LOW (ref 135–145)
TOTAL PROTEIN: 8 g/dL (ref 6.5–8.1)

## 2016-08-17 LAB — MAGNESIUM
MAGNESIUM: 1.9 mg/dL (ref 1.7–2.4)
Magnesium: 1.2 mg/dL — ABNORMAL LOW (ref 1.7–2.4)

## 2016-08-17 LAB — CBC
HEMATOCRIT: 44.7 % (ref 35.0–47.0)
HEMOGLOBIN: 15.7 g/dL (ref 12.0–16.0)
MCH: 37.9 pg — ABNORMAL HIGH (ref 26.0–34.0)
MCHC: 35.2 g/dL (ref 32.0–36.0)
MCV: 107.7 fL — ABNORMAL HIGH (ref 80.0–100.0)
Platelets: 120 10*3/uL — ABNORMAL LOW (ref 150–440)
RBC: 4.15 MIL/uL (ref 3.80–5.20)
RDW: 14.7 % — ABNORMAL HIGH (ref 11.5–14.5)
WBC: 6.5 10*3/uL (ref 3.6–11.0)

## 2016-08-17 LAB — PREGNANCY, URINE: Preg Test, Ur: NEGATIVE

## 2016-08-17 LAB — ETHANOL: Alcohol, Ethyl (B): 23 mg/dL — ABNORMAL HIGH (ref <5)

## 2016-08-17 LAB — LIPASE, BLOOD: Lipase: 21 U/L (ref 11–51)

## 2016-08-17 MED ORDER — LORAZEPAM 2 MG/ML IJ SOLN
0.0000 mg | Freq: Two times a day (BID) | INTRAMUSCULAR | Status: DC
Start: 2016-08-19 — End: 2016-08-19

## 2016-08-17 MED ORDER — THIAMINE HCL 100 MG/ML IJ SOLN
100.0000 mg | Freq: Every day | INTRAMUSCULAR | Status: DC
  Filled 2016-08-17: qty 1

## 2016-08-17 MED ORDER — ADULT MULTIVITAMIN W/MINERALS CH
1.0000 | ORAL_TABLET | Freq: Every day | ORAL | Status: DC
  Administered 2016-08-17 – 2016-08-19 (×2): 1 via ORAL
  Filled 2016-08-17 (×4): qty 1

## 2016-08-17 MED ORDER — FOLIC ACID 1 MG PO TABS
1.0000 mg | ORAL_TABLET | Freq: Every day | ORAL | Status: DC
  Administered 2016-08-17 – 2016-08-19 (×2): 1 mg via ORAL
  Filled 2016-08-17 (×4): qty 1

## 2016-08-17 MED ORDER — SODIUM CHLORIDE 0.9 % IV BOLUS (SEPSIS)
1000.0000 mL | Freq: Once | INTRAVENOUS | Status: AC
  Administered 2016-08-17: 1000 mL via INTRAVENOUS

## 2016-08-17 MED ORDER — POTASSIUM CHLORIDE CRYS ER 20 MEQ PO TBCR
20.0000 meq | EXTENDED_RELEASE_TABLET | Freq: Once | ORAL | Status: AC
  Administered 2016-08-17: 20 meq via ORAL
  Filled 2016-08-17: qty 1

## 2016-08-17 MED ORDER — MAGNESIUM SULFATE 2 GM/50ML IV SOLN
2.0000 g | Freq: Once | INTRAVENOUS | Status: AC
  Administered 2016-08-17: 2 g via INTRAVENOUS
  Filled 2016-08-17 (×2): qty 50

## 2016-08-17 MED ORDER — LORAZEPAM 1 MG PO TABS: 1.0000 mg | ORAL_TABLET | Freq: Four times a day (QID) | ORAL | Status: DC | PRN

## 2016-08-17 MED ORDER — ONDANSETRON HCL 4 MG/2ML IJ SOLN: 4.0000 mg | Freq: Four times a day (QID) | INTRAMUSCULAR | Status: DC | PRN

## 2016-08-17 MED ORDER — POTASSIUM CHLORIDE IN NACL 40-0.9 MEQ/L-% IV SOLN
INTRAVENOUS | Status: DC
  Administered 2016-08-17: 100 mL/h via INTRAVENOUS
  Filled 2016-08-17 (×3): qty 1000

## 2016-08-17 MED ORDER — SODIUM CHLORIDE 0.9 % IV SOLN
1.0000 mg | Freq: Once | INTRAVENOUS | Status: AC
  Administered 2016-08-17: 1 mg via INTRAVENOUS
  Filled 2016-08-17: qty 0.2

## 2016-08-17 MED ORDER — VITAMIN B-1 100 MG PO TABS
100.0000 mg | ORAL_TABLET | Freq: Every day | ORAL | Status: DC
  Administered 2016-08-17 – 2016-08-19 (×2): 100 mg via ORAL
  Filled 2016-08-17 (×4): qty 1

## 2016-08-17 MED ORDER — TRAMADOL HCL 50 MG PO TABS
50.0000 mg | ORAL_TABLET | Freq: Four times a day (QID) | ORAL | Status: DC | PRN
  Administered 2016-08-17 – 2016-08-19 (×2): 50 mg via ORAL
  Filled 2016-08-17 (×2): qty 1

## 2016-08-17 MED ORDER — LORAZEPAM 2 MG/ML IJ SOLN
1.0000 mg | Freq: Once | INTRAMUSCULAR | Status: AC
  Administered 2016-08-17: 1 mg via INTRAVENOUS
  Filled 2016-08-17: qty 1

## 2016-08-17 MED ORDER — POTASSIUM CHLORIDE CRYS ER 20 MEQ PO TBCR
40.0000 meq | EXTENDED_RELEASE_TABLET | Freq: Once | ORAL | Status: AC
  Administered 2016-08-17: 40 meq via ORAL
  Filled 2016-08-17 (×2): qty 2

## 2016-08-17 MED ORDER — THIAMINE HCL 100 MG/ML IJ SOLN
100.0000 mg | Freq: Every day | INTRAMUSCULAR | Status: DC
  Administered 2016-08-17 – 2016-08-19 (×2): 100 mg via INTRAVENOUS
  Filled 2016-08-17 (×2): qty 2

## 2016-08-17 MED ORDER — ENOXAPARIN SODIUM 40 MG/0.4ML ~~LOC~~ SOLN
40.0000 mg | SUBCUTANEOUS | Status: DC
  Administered 2016-08-17 – 2016-08-18 (×2): 40 mg via SUBCUTANEOUS
  Filled 2016-08-17 (×2): qty 0.4

## 2016-08-17 MED ORDER — LORAZEPAM 2 MG/ML IJ SOLN
0.0000 mg | Freq: Four times a day (QID) | INTRAMUSCULAR | Status: DC
Start: 2016-08-17 — End: 2016-08-19
  Administered 2016-08-17 – 2016-08-19 (×4): 2 mg via INTRAVENOUS
  Filled 2016-08-17 (×2): qty 1
  Filled 2016-08-17: qty 2

## 2016-08-17 MED ORDER — ONDANSETRON HCL 4 MG PO TABS: 4.0000 mg | ORAL_TABLET | Freq: Four times a day (QID) | ORAL | Status: DC | PRN

## 2016-08-17 MED ORDER — LORAZEPAM 2 MG/ML IJ SOLN
1.0000 mg | Freq: Four times a day (QID) | INTRAMUSCULAR | Status: DC | PRN
  Filled 2016-08-17: qty 1

## 2016-08-17 NOTE — ED Triage Notes (Signed)
Patient presents to the ED with lack of appetite for several days, nausea, vomiting, abdominal pain and hematuria with dysuria.  Patient reports having episodes of hyperventilation with tingling in hands and feet.  Patient appears slightly anxious in triage but denies tingling at this time.  Patient is not hyperventilating at this time.  Patient is alert and oriented x 4.  No obvious distress.  Ambulatory to triage without difficulty.

## 2016-08-17 NOTE — ED Notes (Signed)
Pt stating that she is feeling much better. Pt asking for something to "eat." Pt given some apple juice to see if she is able to tolerate this first.

## 2016-08-17 NOTE — Progress Notes (Addendum)
Pharmacy electrolyte management   Patient's evening K+ is 3.1 and Mg 1.9. 20 mEq KCl PO x1 ordered. Patient is also receiving fluids with 40 mEq KCl at 100 mL/hours. Recheck BMP with AM labs.  8/26 AM electrolytes WNL. F/u BMP tomorrow AM.

## 2016-08-17 NOTE — ED Notes (Signed)
Pt has had decreased appetite with vomiting for about a week.. Pt also stating that she started having problems with urination. Pt stating that the urine looks orange to bloody. Pt c/o pain with LLQ and Left lower back pain. Pt stating pain 8/10. Pt is denying nausea at this time. Pt stating that also yesterday her hands and feet "drew up." Pt stating that it was like cramps.

## 2016-08-17 NOTE — ED Notes (Signed)
Pt stating that she smokes about 1 pack a day and that she drinks 2 to 3 "fruit drinks" a day. Pt stating that she drinks Bailey's also.

## 2016-08-17 NOTE — H&P (Signed)
[facility] [place] [facility]   PATIENT NAME: Anna Lucas    MR#:  [id number]  DATE OF BIRTH:  1971/02/22  DATE OF ADMISSION:  08/17/2016  PRIMARY CARE PHYSICIAN: No PCP Per Patient   REQUESTING/REFERRING PHYSICIAN: Dr Darnelle Catalan  CHIEF COMPLAINT:  Intractable nausea vomiting  HISTORY OF PRESENT ILLNESS:  Anna Lucas  is a 45 y.o. female with a known history of Alcohol abuse, tobacco abuse, hypertension and history of PTSD comes to the emergency room with intractable nausea vomiting for last couple days. Patient is not able to keep anything orally although she is able to keep her alcohol in!! Last vomiting was this morning She was found to have low potassium and low magnesium. Clinically appears dehydrated and central medicine was called for further evaluation and management Patient drank alcohol few drinks today  PAST MEDICAL HISTORY:   Past Medical History:  Diagnosis Date  . Alcohol abuse   . Anxiety   . Depression   . Hypertension   . PTSD (post-traumatic stress disorder)     PAST SURGICAL HISTOIRY:   Past Surgical History:  Procedure Laterality Date  . CESAREAN SECTION      SOCIAL HISTORY:   Social History  Substance Use Topics  . Smoking status: Current Every Day Smoker    Packs/day: 1.00    Types: Cigarettes  . Smokeless tobacco: Never Used  . Alcohol use Yes     Comment: occas    FAMILY HISTORY:   Family History  Problem Relation Age of Onset  . CAD Paternal Grandfather   . Stroke Paternal Grandfather   . Diabetes Mellitus II Maternal Grandmother   . Emphysema Maternal Grandmother     DRUG ALLERGIES:   Allergies  Allergen Reactions  . Prednisone     Feet swelling     REVIEW OF SYSTEMS:  Review of Systems  Constitutional: Negative for chills, fever and weight loss.  HENT: Negative for ear discharge, ear pain and nosebleeds.   Eyes: Negative for blurred vision, pain and discharge.  Respiratory: Negative  for sputum production, shortness of breath, wheezing and stridor.   Cardiovascular: Negative for chest pain, palpitations, orthopnea and PND.  Gastrointestinal: Positive for nausea and vomiting. Negative for abdominal pain and diarrhea.  Genitourinary: Negative for frequency and urgency.  Musculoskeletal: Negative for back pain and joint pain.  Neurological: Positive for weakness. Negative for sensory change, speech change and focal weakness.  Psychiatric/Behavioral: Negative for depression and hallucinations. The patient is not nervous/anxious.      MEDICATIONS AT HOME:   Prior to Admission medications   Medication Sig Start Date End Date Taking? Authorizing Provider  albuterol (PROVENTIL HFA;VENTOLIN HFA) 108 (90 Base) MCG/ACT inhaler Inhale 2 puffs into the lungs every 6 (six) hours as needed for wheezing or shortness of breath. 02/02/16  Yes Phineas Semen, MD  busPIRone (BUSPAR) 10 MG tablet Take 20 mg by mouth at bedtime.   Yes Historical Provider, MD  FLUoxetine (PROZAC) 20 MG capsule Take 20 mg by mouth daily.    Yes Historical Provider, MD  metoprolol (LOPRESSOR) 50 MG tablet Take 1 tablet (50 mg total) by mouth 2 (two) times daily. 12/23/15  Yes Srikar Sudini, MD  omeprazole (PRILOSEC) 40 MG capsule Take 40 mg by mouth daily.   Yes Historical Provider, MD  traZODone (DESYREL) 50 MG tablet Take 100 mg by mouth at bedtime.   Yes Historical Provider, MD  ondansetron (ZOFRAN) 4 MG tablet Take 1 tablet (4 mg  total) by mouth every 6 (six) hours as needed for nausea. Patient not taking: Reported on 02/02/2016 12/23/15   Milagros LollSrikar Sudini, MD  oxyCODONE (OXY IR/ROXICODONE) 5 MG immediate release tablet Take 1 tablet (5 mg total) by mouth every 6 (six) hours as needed for moderate pain or severe pain. Patient not taking: Reported on 02/02/2016 12/23/15   Milagros LollSrikar Sudini, MD  permethrin (ELIMITE) 5 % cream Apply topically once. Patient not taking: Reported on 02/02/2016 12/23/15   Milagros LollSrikar Sudini, MD       VITAL SIGNS:  Blood pressure 135/78, pulse 92, temperature 98.6 F (37 C), temperature source Oral, resp. rate 20, height 5' 5.5" (1.664 m), weight 170 lb (77.1 kg), SpO2 94 %.  PHYSICAL EXAMINATION:  GENERAL:  45 y.o.-year-old patient lying in the bed with no acute distress.  EYES: Pupils equal, round, reactive to light and accommodation. No scleral icterus. Extraocular muscles intact.  HEENT: Head atraumatic, normocephalic. Oropharynx and nasopharynx clear.  NECK:  Supple, no jugular venous distention. No thyroid enlargement, no tenderness.  LUNGS: Normal breath sounds bilaterally, no wheezing, rales,rhonchi or crepitation. No use of accessory muscles of respiration.  CARDIOVASCULAR: S1, S2 normal. No murmurs, rubs, or gallops.  ABDOMEN: Soft, nontender, nondistended. Bowel sounds present. No organomegaly or mass.  EXTREMITIES: No pedal edema, cyanosis, or clubbing.  NEUROLOGIC: Cranial nerves II through XII are intact. Muscle strength 5/5 in all extremities. Sensation intact. Gait not checked.  PSYCHIATRIC: The patient is alert and oriented x 3.  SKIN: No obvious rash, lesion, or ulcer.   LABORATORY PANEL:   CBC  Recent Labs Lab 08/17/16 1140  WBC 6.5  HGB 15.7  HCT 44.7  PLT 120*   ------------------------------------------------------------------------------------------------------------------  Chemistries   Recent Labs Lab 08/17/16 1140  NA 134*  K 2.9*  CL 91*  CO2 26  GLUCOSE 131*  BUN 8  CREATININE 0.84  CALCIUM 9.6  MG 1.2*  AST 194*  ALT 83*  ALKPHOS 127*  BILITOT 3.1*   ------------------------------------------------------------------------------------------------------------------  Cardiac Enzymes No results for input(s): TROPONINI in the last 168 hours. ------------------------------------------------------------------------------------------------------------------  RADIOLOGY:  Ct Renal Stone Study  Result Date: 08/17/2016 CLINICAL DATA:   Patient presents to the ED with lack of appetite for several days, nausea, vomiting, abdominal pain and hematuria with dysuria. Patient reports having episodes of hyperventilation with tingling in hands and feet. Patient appears slightly anxious. EXAM: CT ABDOMEN AND PELVIS WITHOUT CONTRAST TECHNIQUE: Multidetector CT imaging of the abdomen and pelvis was performed following the standard protocol without IV contrast. COMPARISON:  12/19/2015 FINDINGS: Lung bases:  Clear.  Heart normal in size. Hepatobiliary: Liver mildly enlarged. Extensive liver fatty infiltration. No liver mass or focal lesion. There is increased attenuation in the gallbladder consistent with viscus bile, with possible stones or sludge. No wall thickening or inflammation. No bile duct dilation. Spleen, pancreas, adrenal glands:  Normal. Kidneys, ureters, bladder: No renal masses or stones. No hydronephrosis. Normal ureters. Normal bladder. Uterus and adnexa:  Unremarkable. Vascular: Mild aortic atherosclerotic calcifications extending to the iliac arteries. No aneurysm. Lymph nodes:  No adenopathy. Ascites:  None. Gastrointestinal:  Unremarkable.  Normal appendix visualized. Musculoskeletal:  Unremarkable. IMPRESSION: 1. No acute findings within the abdomen or pelvis. 2. Mild hepatomegaly with extensive hepatic steatosis. 3. Mild aortic atherosclerosis. 4. No renal or ureteral stones or obstructive uropathy. 5. Bowel is unremarkable.  Normal appendix visualized. Electronically Signed   By: Amie Portlandavid  Ormond M.D.   On: 08/17/2016 17:27    EKG:   NSR IMPRESSION  AND PLAN:   Omara Alcon  is a 45 y.o. female with a known history of Alcohol abuse, tobacco abuse, hypertension and history of PTSD comes to the emergency room with intractable nausea vomiting for last couple days. Patient is not able to keep anything orally although she is able to keep her alcohol in!! Last vomiting was this morning   1. Intractable nausea vomiting in the setting of  alcohol abuse likely has acute gastritis -Clear liquid diet -IV fluids -Advance diet as tolerated  2. Hypomagnesemia, hypokalemia secondary to GI losses -Repeat IV and by mouth  3. Acute alcohol intoxication -Place patient on CIWA protocol  4. Tobacco abuse smoking cessation counseled  5. Patient has history of anxiety depression and PTSD She was on Prozac however she quit taking 2 weeks ago She has an appointment with her psychiatrist on Tuesday of next week  6. DVT prophylaxis subcutaneous Lovenox All the records are reviewed and case discussed with ED provider. Management plans discussed with the patient, family and they are in agreement.  CODE STATUS: full TOTAL TIME TAKING CARE OF THIS PATIENT: 50 minutes.    Voncille Simm M.D on 08/17/2016 at 8:19 PM  Between 7am to 6pm - Pager - [phone]  After 6pm go to [email or web address] - password [email or web address] [facility]  [facility]  Office  [phone]  CC: Primary care physician; No PCP Per Patient

## 2016-08-17 NOTE — ED Provider Notes (Addendum)
[facility] Emergency Department Provider Note  ____________________________________________   I have reviewed the triage vital signs and the nursing notes.   HISTORY  Chief Complaint Hematuria; Anxiety; Abdominal Pain; and Emesis    HPI Anna Lucas is a 45 y.o. female who drinks alcohol every day. She has never had withdrawal symptoms. She was on Prozac and trazodone. She stopped her dictations "cold [place]" approximately 2 weeks ago because she ran out and did not give a chance to get her scripts filled. She has no SI or HI but she has been suffering for the last day or 2 from vomiting and diffuse abdominal discomfort specifically the left side. She states her left lower abdominal "swollen". She denies dysuria or urinary frequency. She denies diarrhea. She denies hematemesis. Patient denies hallucinations. She forgot to mention that her abdomen was hurt until prompted. Once reminded, patient states she has occasional left lower quadrant sharp nonradiating pain which comes and goes with nothing alleviating or aggravating. She is also noted dark urine.    Past Medical History:  Diagnosis Date  . Alcohol abuse   . Anxiety   . Depression   . Hypertension   . PTSD (post-traumatic stress disorder)     Patient Active Problem List   Diagnosis Date Noted  . Pyelonephritis 12/19/2015    Past Surgical History:  Procedure Laterality Date  . CESAREAN SECTION      Prior to Admission medications   Medication Sig Start Date End Date Taking? Authorizing Provider  albuterol (PROVENTIL HFA;VENTOLIN HFA) 108 (90 Base) MCG/ACT inhaler Inhale 2 puffs into the lungs every 6 (six) hours as needed for wheezing or shortness of breath. 02/02/16   Phineas Semen, MD  busPIRone (BUSPAR) 10 MG tablet Take 20 mg by mouth at bedtime.    Historical Provider, MD  ciprofloxacin (CIPRO) 500 MG tablet Take 1 tablet (500 mg total) by mouth 2 (two) times daily. Patient not taking:  Reported on 02/02/2016 12/23/15   Milagros Loll, MD  FLUoxetine (PROZAC) 10 MG capsule Take 20 mg by mouth daily.     Historical Provider, MD  metoprolol (LOPRESSOR) 50 MG tablet Take 1 tablet (50 mg total) by mouth 2 (two) times daily. 12/23/15   Srikar Sudini, MD  ondansetron (ZOFRAN) 4 MG tablet Take 1 tablet (4 mg total) by mouth every 6 (six) hours as needed for nausea. Patient not taking: Reported on 02/02/2016 12/23/15   Milagros Loll, MD  oxyCODONE (OXY IR/ROXICODONE) 5 MG immediate release tablet Take 1 tablet (5 mg total) by mouth every 6 (six) hours as needed for moderate pain or severe pain. Patient not taking: Reported on 02/02/2016 12/23/15   Milagros Loll, MD  permethrin (ELIMITE) 5 % cream Apply topically once. Patient not taking: Reported on 02/02/2016 12/23/15   Milagros Loll, MD  traZODone (DESYREL) 50 MG tablet Take 100 mg by mouth at bedtime.    Historical Provider, MD    Allergies Prednisone  Family History  Problem Relation Age of Onset  . CAD Paternal Grandfather   . Stroke Paternal Grandfather   . Diabetes Mellitus II Maternal Grandmother   . Emphysema Maternal Grandmother     Social History Social History  Substance Use Topics  . Smoking status: Current Every Day Smoker    Packs/day: 1.00    Types: Cigarettes  . Smokeless tobacco: Never Used  . Alcohol use Yes     Comment: occas    Review of Systems onstitutional: No fever/chills Eyes: No visual  changes. ENT: No sore throat. No stiff neck no neck pain Cardiovascular: Denies chest pain. Respiratory: Denies shortness of breath. Gastrointestinal:   no vomiting.  No diarrhea.  No constipation. Genitourinary: Negative for dysuria. Musculoskeletal: Negative lower extremity swelling Skin: Negative for rash. Neurological: Negative for severe headaches, focal weakness or numbness. 10-point ROS otherwise negative.  ____________________________________________   PHYSICAL EXAM:  VITAL SIGNS: ED Triage Vitals   Enc Vitals Group     BP 08/17/16 1128 (!) 140/101     Pulse Rate 08/17/16 1128 (!) 102     Resp 08/17/16 1128 20     Temp 08/17/16 1128 98.6 F (37 C)     Temp Source 08/17/16 1128 Oral     SpO2 08/17/16 1128 96 %     Weight 08/17/16 1129 170 lb (77.1 kg)     Height 08/17/16 1129 5' 5.5" (1.664 m)     Head Circumference --      Peak Flow --      Pain Score 08/17/16 1128 10     Pain Loc --      Pain Edu? --      Excl. in GC? --     Constitutional: Alert and oriented. Well appearing and in no acute distress. Eyes: Conjunctivae are normal. PERRL. EOMI. Head: Atraumatic. Nose: No congestion/rhinnorhea. Mouth/Throat: Mucous membranes are moist.  Oropharynx non-erythematous. Neck: No stridor.   Nontender with no meningismus Cardiovascular: Normal rate, regular rhythm. Grossly normal heart sounds.  Good peripheral circulation. Respiratory: Normal respiratory effort.  No retractions. Lungs CTAB. Abdominal: Soft and nontender. No distention. No guarding no rebound, there is minimal tenderness to palpation of the left lower quadrant also some mild epigastric discomfort, distractible exam nonsurgical abdomen Back:  There is no focal tenderness or step off.  there is no midline tenderness there are no lesions noted. there is no CVA tenderness Musculoskeletal: No lower extremity tenderness, no upper extremity tenderness. No joint effusions, no DVT signs strong distal pulses no edema Neurologic:  Normal speech and language. No gross focal neurologic deficits are appreciated.  Skin:  Skin is warm, dry and intact. No rash noted. Psychiatric: Mood and affect are normal. Speech and behavior are normal.  ____________________________________________   LABS (all labs ordered are listed, but only abnormal results are displayed)  Labs Reviewed  COMPREHENSIVE METABOLIC PANEL - Abnormal; Notable for the following:       Result Value   Sodium 134 (*)    Potassium 2.9 (*)    Chloride 91 (*)     Glucose, Bld 131 (*)    AST 194 (*)    ALT 83 (*)    Alkaline Phosphatase 127 (*)    Total Bilirubin 3.1 (*)    Anion gap 17 (*)    All other components within normal limits  CBC - Abnormal; Notable for the following:    MCV 107.7 (*)    MCH 37.9 (*)    RDW 14.7 (*)    Platelets 120 (*)    All other components within normal limits  URINALYSIS COMPLETEWITH MICROSCOPIC ([facility] ONLY) - Abnormal; Notable for the following:    Color, Urine AMBER (*)    APPearance CLEAR (*)    Glucose, UA 100 (*)    Bilirubin Urine 2+ (*)    Ketones, ur 1+ (*)    Specific Gravity, Urine >1.030 (*)    Hgb urine dipstick TRACE (*)    Protein, ur 100 (*)    Bacteria, UA RARE (*)  Squamous Epithelial / LPF 6-30 (*)    All other components within normal limits  MAGNESIUM - Abnormal; Notable for the following:    Magnesium 1.2 (*)    All other components within normal limits  ETHANOL - Abnormal; Notable for the following:    Alcohol, Ethyl (B) 23 (*)    All other components within normal limits  LIPASE, BLOOD  PREGNANCY, URINE   ____________________________________________  EKG  I personally interpreted any EKGs ordered by me or triage Normal sinus rhythm rate 78 bpm, no acute ST elevation or depression, QRS interval is 68 QTC is 494 ____________________________________________  RADIOLOGY  I reviewed any imaging ordered by me or triage that were performed during my shift and, if possible, patient and/or family made aware of any abnormal findings. ____________________________________________   PROCEDURES  Procedure(s) performed: None  Procedures  Critical Care performed: None  ____________________________________________   INITIAL IMPRESSION / ASSESSMENT AND PLAN / ED COURSE  Pertinent labs & imaging results that were available during my care of the patient were reviewed by me and considered in my medical decision making (see chart for details).  Patient he likely is alcoholic  presents today with vomiting, there are multiple different things going on today. The first is that she is probably withdrawing from her Prozac which she quit cold [place]. However, this is only one thing that is going on. Patient has hypo-magnesium, as well as hypokalemia and hypochloremia all likely relative to vomiting. Patient has elevated liver fraction tests elevated bilirubin likely secondary to alcohol abuse which is ongoing. I did do a CT scan of her abdomen and pelvis for cause of her left lower quadrant pain we have found nothing. White count is not markedly elevated. I suspect this is some, patient alcoholic gastritis and withdrawal from her Prozac. We can suddenly restart her Prozac. I have given her repletion of her magnesium and potassium and continue so at home. No evidence of urinary tract infection. Patient's alcohol is 23, she is somewhat anxious and admits to anxiety but does not appear to be in florid withdrawal. I did give her Ativan here as a precaution. I'm concerned about her bilirubin of 3, think this likely represents renal failure in the context of alcohol abuse. There does not appear to be any focal findings on CT at least to suggest this is caused by a organic pathology such as stone. She may benefit from her right upper quadrant also but she does not tenderness there.   ----------------------------------------- 6:46 PM on 08/17/2016 -----------------------------------------  Discussed with Dr. Mechele Collin who does not feel given the clinical picture that ultrasound is indicated, however, given her very low magnesium and her long QTC, I feel the patient benefit from admission. Discussed with Dr. Allena Katz who graciously admits the patient onto her service for this reason  Clinical Course   ____________________________________________   FINAL CLINICAL IMPRESSION(S) / ED DIAGNOSES  Final diagnoses:  None      This chart was dictated using voice recognition software.  Despite  best efforts to proofread,  errors can occur which can change meaning.      Jeanmarie Plant, MD 08/17/16 1744    Jeanmarie Plant, MD 08/17/16 1840    Jeanmarie Plant, MD 08/17/16 [phone]

## 2016-08-17 NOTE — ED Notes (Signed)
Pt stating, " I have been sleeping." Pt stating that her nausea has resolved at this time. Pt has family at bedside. Pt is in NAD.

## 2016-08-18 ENCOUNTER — Inpatient Hospital Stay: Payer: Self-pay

## 2016-08-18 LAB — GASTROINTESTINAL PANEL BY PCR, STOOL (REPLACES STOOL CULTURE)

## 2016-08-18 LAB — BASIC METABOLIC PANEL
Anion gap: 5 (ref 5–15)
BUN: 8 mg/dL (ref 6–20)
CHLORIDE: 102 mmol/L (ref 101–111)
CO2: 29 mmol/L (ref 22–32)
CREATININE: 0.74 mg/dL (ref 0.44–1.00)
Calcium: 8.7 mg/dL — ABNORMAL LOW (ref 8.9–10.3)
GFR calc Af Amer: >60 mL/min (ref >60)
GFR calc non Af Amer: >60 mL/min (ref >60)
Glucose, Bld: 95 mg/dL (ref 65–99)
Potassium: 3.6 mmol/L (ref 3.5–5.1)
SODIUM: 136 mmol/L (ref 135–145)

## 2016-08-18 LAB — PHOSPHORUS: Phosphorus: 2.7 mg/dL (ref 2.5–4.6)

## 2016-08-18 LAB — C DIFFICILE QUICK SCREEN W PCR REFLEX
C Diff antigen: NEGATIVE
C Diff interpretation: NOT DETECTED
C Diff toxin: NEGATIVE

## 2016-08-18 LAB — BILIRUBIN, TOTAL: BILIRUBIN TOTAL: 2.5 mg/dL — AB (ref 0.3–1.2)

## 2016-08-18 MED ORDER — POTASSIUM CHLORIDE CRYS ER 20 MEQ PO TBCR
20.0000 meq | EXTENDED_RELEASE_TABLET | Freq: Every day | ORAL | Status: DC
  Administered 2016-08-18 – 2016-08-19 (×2): 20 meq via ORAL
  Filled 2016-08-18 (×2): qty 1

## 2016-08-18 MED ORDER — LOPERAMIDE HCL 2 MG PO CAPS
2.0000 mg | ORAL_CAPSULE | Freq: Four times a day (QID) | ORAL | Status: DC | PRN
  Administered 2016-08-18: 2 mg via ORAL
  Filled 2016-08-18: qty 1

## 2016-08-18 NOTE — Progress Notes (Signed)
[employer] - [place] [facility]   PATIENT NAME: Anna Lucas    MR#:  [id number]  DATE OF BIRTH:  [birth year]  SUBJECTIVE:  CHIEF COMPLAINT:   Chief Complaint  Patient presents with  . Hematuria  . Anxiety  . Abdominal Pain  . Emesis   Watery diarrhea 3 times this morning. REVIEW OF SYSTEMS:  Review of Systems  Constitutional: Positive for malaise/fatigue. Negative for chills and fever.  HENT: Negative for congestion.   Eyes: Negative for blurred vision and double vision.  Respiratory: Negative for cough, sputum production and shortness of breath.   Cardiovascular: Negative for chest pain, palpitations and leg swelling.  Gastrointestinal: Positive for diarrhea and nausea. Negative for abdominal pain, blood in stool, melena and vomiting.  Genitourinary: Negative for dysuria, flank pain and urgency.  Musculoskeletal: Negative for back pain.  Skin: Negative for rash.  Neurological: Negative for dizziness, focal weakness and headaches.  Psychiatric/Behavioral: Negative for depression.    DRUG ALLERGIES:   Allergies  Allergen Reactions  . Prednisone     Feet swelling    VITALS:  Blood pressure 118/72, pulse 76, temperature 98.2 F (36.8 C), temperature source Oral, resp. rate 18, height 5' 5.5" (1.664 m), weight 175 lb 14.4 oz (79.8 kg), SpO2 94 %. PHYSICAL EXAMINATION:  Physical Exam  Constitutional: She is oriented to person, place, and time and well-developed, well-nourished, and in no distress. No distress.  HENT:  Head: Normocephalic and atraumatic.  Mouth/Throat: Oropharynx is clear and moist.  Eyes: Conjunctivae and EOM are normal. Pupils are equal, round, and reactive to light. No scleral icterus.  Neck: Normal range of motion. Neck supple. No JVD present. No thyromegaly present.  Cardiovascular: Normal rate, regular rhythm and normal heart sounds.  Exam reveals no gallop.   No murmur heard. Pulmonary/Chest: Effort normal and breath  sounds normal. No respiratory distress. She has no wheezes. She has no rales.  Abdominal: Soft. Bowel sounds are normal. She exhibits no distension. There is no tenderness.  Musculoskeletal: Normal range of motion. She exhibits no edema.  Lymphadenopathy:    She has no cervical adenopathy.  Neurological: She is alert and oriented to person, place, and time.  Skin: Skin is warm.   LABORATORY PANEL:   CBC  Recent Labs Lab 08/17/16 1140  WBC 6.5  HGB 15.7  HCT 44.7  PLT 120*   ------------------------------------------------------------------------------------------------------------------ Chemistries   Recent Labs Lab 08/17/16 1140 08/17/16 2055 08/18/16 0433  NA 134*  --  136  K 2.9* 3.1* 3.6  CL 91*  --  102  CO2 26  --  29  GLUCOSE 131*  --  95  BUN 8  --  8  CREATININE 0.84  --  0.74  CALCIUM 9.6  --  8.7*  MG 1.2* 1.9  --   AST 194*  --   --   ALT 83*  --   --   ALKPHOS 127*  --   --   BILITOT 3.1*  --  2.5*   RADIOLOGY:  Ct Renal Stone Study  Result Date: 08/17/2016 CLINICAL DATA:  Patient presents to the ED with lack of appetite for several days, nausea, vomiting, abdominal pain and hematuria with dysuria. Patient reports having episodes of hyperventilation with tingling in hands and feet. Patient appears slightly anxious. EXAM: CT ABDOMEN AND PELVIS WITHOUT CONTRAST TECHNIQUE: Multidetector CT imaging of the abdomen and pelvis was performed following the standard protocol without IV contrast. COMPARISON:  12/19/2015 FINDINGS: Lung bases:  Clear.  Heart normal in size. Hepatobiliary: Liver mildly enlarged. Extensive liver fatty infiltration. No liver mass or focal lesion. There is increased attenuation in the gallbladder consistent with viscus bile, with possible stones or sludge. No wall thickening or inflammation. No bile duct dilation. Spleen, pancreas, adrenal glands:  Normal. Kidneys, ureters, bladder: No renal masses or stones. No hydronephrosis. Normal  ureters. Normal bladder. Uterus and adnexa:  Unremarkable. Vascular: Mild aortic atherosclerotic calcifications extending to the iliac arteries. No aneurysm. Lymph nodes:  No adenopathy. Ascites:  None. Gastrointestinal:  Unremarkable.  Normal appendix visualized. Musculoskeletal:  Unremarkable. IMPRESSION: 1. No acute findings within the abdomen or pelvis. 2. Mild hepatomegaly with extensive hepatic steatosis. 3. Mild aortic atherosclerosis. 4. No renal or ureteral stones or obstructive uropathy. 5. Bowel is unremarkable.  Normal appendix visualized. Electronically Signed   By: Amie Portland M.D.   On: 08/17/2016 17:27   ASSESSMENT AND PLAN:   1. Intractable nausea vomiting in the setting of alcohol abuse likely has acute Gastroenteritis. Better but had watery diarrhea 3 times this morning. Stool test is negative. -Advance diet as tolerated, Imodium when necessary.  2. Hypomagnesemia, hypokalemia secondary to GI losses Improved with IV and by mouth supplement.  3. Acute alcohol intoxication on CIWA protocol  4. Tobacco abuse smoking cessation counseled for 3 minutes.  5. Patient has history of anxiety depression and PTSD She was on Prozac however she quit taking 2 weeks ago She has an appointment with her psychiatrist on Tuesday of next week  Elevated bilirubin. Follow-up abdominal ultrasound and bilirubin.  All the records are reviewed and case discussed with Care Management/Social Worker. Management plans discussed with the patient, family and they are in agreement.  CODE STATUS: Full code.  TOTAL TIME TAKING CARE OF THIS PATIENT: 33 minutes.   More than 50% of the time was spent in counseling/coordination of care: YES  POSSIBLE D/C IN 1-2 DAYS, DEPENDING ON CLINICAL CONDITION.   Shaune Pollack M.D on 08/18/2016 at 2:21 PM  Between 7am to 6pm - Pager - [phone]  After 6pm go to [email or web address] - Scientist, research (life sciences) [facility]  Office   [phone]  CC: Primary care physician; No PCP Per Patient  Note: This dictation was prepared with Dragon dictation along with smaller phrase technology. Any transcriptional errors that result from this process are unintentional.

## 2016-08-18 NOTE — Progress Notes (Signed)
Patient having diarrhea, Dr Imogene Burnhen text paged for rule out c.diff. Stool sample sent to lab. Waiting on results.

## 2016-08-19 LAB — BASIC METABOLIC PANEL
Anion gap: 7 (ref 5–15)
BUN: 9 mg/dL (ref 6–20)
CHLORIDE: 105 mmol/L (ref 101–111)
CO2: 24 mmol/L (ref 22–32)
CREATININE: 0.84 mg/dL (ref 0.44–1.00)
Calcium: 8.6 mg/dL — ABNORMAL LOW (ref 8.9–10.3)
GFR calc Af Amer: >60 mL/min (ref >60)
GFR calc non Af Amer: >60 mL/min (ref >60)
GLUCOSE: 97 mg/dL (ref 65–99)
POTASSIUM: 4.1 mmol/L (ref 3.5–5.1)
SODIUM: 136 mmol/L (ref 135–145)

## 2016-08-19 MED ORDER — TRAZODONE HCL 50 MG PO TABS: 100.0000 mg | ORAL_TABLET | Freq: Every day | ORAL | 0 refills | Status: DC

## 2016-08-19 MED ORDER — FLUOXETINE HCL 20 MG PO CAPS: 20.0000 mg | ORAL_CAPSULE | Freq: Every day | ORAL | 0 refills | Status: DC

## 2016-08-19 NOTE — Discharge Instructions (Signed)
Heart healthy diet. Smoking cessation. Alcohol cessation program.

## 2016-08-19 NOTE — Discharge Summary (Addendum)
[facility] [place] at [facility]   PATIENT NAME: Anna Lucas    MR#:  [id number]  DATE OF BIRTH:  12/30/1970  DATE OF ADMISSION:  08/17/2016   ADMITTING PHYSICIAN: Enedina FinnerSona Patel, MD  DATE OF DISCHARGE: 08/19/2016 PRIMARY CARE PHYSICIAN: No PCP Per Patient   ADMISSION DIAGNOSIS:  Hypomagnesemia [E83.42] ETOH abuse [F10.10] DISCHARGE DIAGNOSIS:  Active Problems:   Intractable vomiting Acute Gastroenteritis. SECONDARY DIAGNOSIS:   Past Medical History:  Diagnosis Date  . Alcohol abuse   . Anxiety   . Depression   . Hypertension   . PTSD (post-traumatic stress disorder)    HOSPITAL COURSE:   1. Intractable nausea vomiting in the setting of alcohol abuse likely has acute Gastroenteritis. Stool test is negative. tolerated diet, Imodium when necessary. Diarrhea resolved.  2. Hypomagnesemia, hypokalemia secondary to GI losses Improved with IV and by mouth supplement.  3. Acute alcohol intoxication on CIWAprotocol, no signs of withdrawal.  4. Tobacco abuse smoking cessation counseled for 3 minutes.  5. Patient has history of anxiety depression and PTSD She was on Prozac however she quit taking 2 weeks ago She has an appointment with her psychiatrist on Tuesday of next week  Elevated bilirubin. Fatty liver per abdominal ultrasound.  DISCHARGE CONDITIONS:  Stable, discharge to home today. CONSULTS OBTAINED:   DRUG ALLERGIES:   Allergies  Allergen Reactions  . Prednisone     Feet swelling    DISCHARGE MEDICATIONS:     Medication List    TAKE these medications   albuterol 108 (90 Base) MCG/ACT inhaler Commonly known as:  PROVENTIL HFA;VENTOLIN HFA Inhale 2 puffs into the lungs every 6 (six) hours as needed for wheezing or shortness of breath.   busPIRone 10 MG tablet Commonly known as:  BUSPAR Take 20 mg by mouth at bedtime.   FLUoxetine 20 MG capsule Commonly known as:  PROZAC Take 1 capsule (20 mg total) by mouth daily.     metoprolol 50 MG tablet Commonly known as:  LOPRESSOR Take 1 tablet (50 mg total) by mouth 2 (two) times daily.   omeprazole 40 MG capsule Commonly known as:  PRILOSEC Take 40 mg by mouth daily.   ondansetron 4 MG tablet Commonly known as:  ZOFRAN Take 1 tablet (4 mg total) by mouth every 6 (six) hours as needed for nausea.   oxyCODONE 5 MG immediate release tablet Commonly known as:  Oxy IR/ROXICODONE Take 1 tablet (5 mg total) by mouth every 6 (six) hours as needed for moderate pain or severe pain.   permethrin 5 % cream Commonly known as:  ELIMITE Apply topically once.   traZODone 50 MG tablet Commonly known as:  DESYREL Take 2 tablets (100 mg total) by mouth at bedtime.        DISCHARGE INSTRUCTIONS:   DIET:  Heart healthy diet. DISCHARGE CONDITION:  Stable. ACTIVITY:  As tolerated. DISCHARGE LOCATION:    If you experience worsening of your admission symptoms, develop shortness of breath, life threatening emergency, suicidal or homicidal thoughts you must seek medical attention immediately by calling 911 or calling your MD immediately  if symptoms less severe.  You Must read complete instructions/literature along with all the possible adverse reactions/side effects for all the Medicines you take and that have been prescribed to you. Take any new Medicines after you have completely understood and accpet all the possible adverse reactions/side effects.   Please note  You were cared for by a hospitalist during your hospital stay. If you  have any questions about your discharge medications or the care you received while you were in the hospital after you are discharged, you can call the unit and asked to speak with the hospitalist on call if the hospitalist that took care of you is not available. Once you are discharged, your primary care physician will handle any further medical issues. Please note that NO REFILLS for any discharge medications will be authorized once  you are discharged, as it is imperative that you return to your primary care physician (or establish a relationship with a primary care physician if you do not have one) for your aftercare needs so that they can reassess your need for medications and monitor your lab values.    On the day of Discharge:  VITAL SIGNS:  Blood pressure 130/90, pulse 85, temperature 98.1 F (36.7 C), temperature source Oral, resp. rate 18, height 5' 5.5" (1.664 m), weight 175 lb 14.4 oz (79.8 kg), SpO2 98 %. PHYSICAL EXAMINATION:  GENERAL:  45 y.o.-year-old patient lying in the bed with no acute distress.  EYES: Pupils equal, round, reactive to light and accommodation. No scleral icterus. Extraocular muscles intact.  HEENT: Head atraumatic, normocephalic. Oropharynx and nasopharynx clear.  NECK:  Supple, no jugular venous distention. No thyroid enlargement, no tenderness.  LUNGS: Normal breath sounds bilaterally, no wheezing, rales,rhonchi or crepitation. No use of accessory muscles of respiration.  CARDIOVASCULAR: S1, S2 normal. No murmurs, rubs, or gallops.  ABDOMEN: Soft, non-tender, non-distended. Bowel sounds present. No organomegaly or mass.  EXTREMITIES: No pedal edema, cyanosis, or clubbing.  NEUROLOGIC: Cranial nerves II through XII are intact. Muscle strength 5/5 in all extremities. Sensation intact. Gait not checked.  PSYCHIATRIC: The patient is alert and oriented x 3.  SKIN: No obvious rash, lesion, or ulcer.  DATA REVIEW:   CBC  Recent Labs Lab 08/17/16 1140  WBC 6.5  HGB 15.7  HCT 44.7  PLT 120*    Chemistries   Recent Labs Lab 08/17/16 1140 08/17/16 2055 08/18/16 0433 08/19/16 0404  NA 134*  --  136 136  K 2.9* 3.1* 3.6 4.1  CL 91*  --  102 105  CO2 26  --  29 24  GLUCOSE 131*  --  95 97  BUN 8  --  8 9  CREATININE 0.84  --  0.74 0.84  CALCIUM 9.6  --  8.7* 8.6*  MG 1.2* 1.9  --   --   AST 194*  --   --   --   ALT 83*  --   --   --   ALKPHOS 127*  --   --   --   BILITOT  3.1*  --  2.5*  --      Microbiology Results  Results for orders placed or performed during the hospital encounter of 08/17/16  C difficile quick scan w PCR reflex     Status: None   Collection Time: 08/18/16  8:52 AM  Result Value Ref Range Status   C Diff antigen NEGATIVE NEGATIVE Final   C Diff toxin NEGATIVE NEGATIVE Final   C Diff interpretation No C. difficile detected.  Final  Gastrointestinal Panel by PCR , Stool     Status: None   Collection Time: 08/18/16  8:52 AM  Result Value Ref Range Status   Campylobacter species NOT DETECTED NOT DETECTED Final   Plesimonas shigelloides NOT DETECTED NOT DETECTED Final   Salmonella species NOT DETECTED NOT DETECTED Final   Yersinia enterocolitica NOT DETECTED  NOT DETECTED Final   Vibrio species NOT DETECTED NOT DETECTED Final   Vibrio cholerae NOT DETECTED NOT DETECTED Final   Enteroaggregative E coli (EAEC) NOT DETECTED NOT DETECTED Final   Enteropathogenic E coli (EPEC) NOT DETECTED NOT DETECTED Final   Enterotoxigenic E coli (ETEC) NOT DETECTED NOT DETECTED Final   Shiga like toxin producing E coli (STEC) NOT DETECTED NOT DETECTED Final   Shigella/Enteroinvasive E coli (EIEC) NOT DETECTED NOT DETECTED Final   Cryptosporidium NOT DETECTED NOT DETECTED Final   Cyclospora cayetanensis NOT DETECTED NOT DETECTED Final   Entamoeba histolytica NOT DETECTED NOT DETECTED Final   Giardia lamblia NOT DETECTED NOT DETECTED Final   Adenovirus F40/41 NOT DETECTED NOT DETECTED Final   Astrovirus NOT DETECTED NOT DETECTED Final   Norovirus GI/GII NOT DETECTED NOT DETECTED Final   Rotavirus A NOT DETECTED NOT DETECTED Final   Sapovirus (I, II, IV, and V) NOT DETECTED NOT DETECTED Final    RADIOLOGY:  US Abdomen Limited Ruq  Result Date: 08/18/2016 CLINICAL DATA:  Vomiting, for 2 days.  Epigastric pain for 2 weeks. EXAM: US ABDOMEN LIMITED - RIGHT UPPER QUADRANT COMPARISON:  CT 08/17/2016 FINDINGS: Gallbladder: Mild distention. No stones or  wall thickening. Negative sonographic Murphy's. Common bile duct: Diameter: Normal caliber, 4 mm Liver: Echogenic compatible with fatty infiltration. No focal abnormality or biliary duct dilatation. IMPRESSION: Fatty infiltration of the liver.  No acute findings. Electronically Signed   By: Charlett Nose M.D.   On: 08/18/2016 16:03     Management plans discussed with the patient, family and they are in agreement.  CODE STATUS:     Code Status Orders        Start     Ordered   08/17/16 2041  Full code  Continuous     08/17/16 2040    Code Status History    Date Active Date Inactive Code Status Order ID Comments User Context   12/19/2015  5:05 PM 12/23/2015  2:11 PM Full Code [id number]  Arnaldo Natal, MD Inpatient      TOTAL TIME TAKING CARE OF THIS PATIENT: 32 minutes.    Shaune Pollack M.D on 08/19/2016 at 10:54 AM  Between 7am to 6pm - Pager - [phone]  After 6pm go to [email or web address] - Scientist, research (life sciences) [facility]  Office  [phone]  CC: Primary care physician; No PCP Per Patient   Note: This dictation was prepared with Dragon dictation along with smaller phrase technology. Any transcriptional errors that result from this process are unintentional.

## 2016-08-19 NOTE — Progress Notes (Signed)
[facility] - [place] at [facility]        Anna DickJoyce Lucas was admitted to the Hospital on 08/17/2016 and Discharged  08/19/2016 and should be excused from work/school   for 6 days starting 08/17/2016 , may return to work/school without any restrictions.  Anna Lucas, Anna Lucas M.D on 08/19/2016,at 8:23 AM  [facility] - [place] at [facility]    Office  [phone]

## 2016-08-19 NOTE — Progress Notes (Signed)
Pharmacy Electrolyte Management CONSULT NOTE - follow up  Pharmacy Consult for Management of Electrolytes Indication: hypokalemia/hypomagnesia   Recent Labs  08/17/16 1140  WBC 6.5  HGB 15.7  HCT 44.7  PLT 120*     Recent Labs  08/17/16 1140 08/17/16 2055 08/18/16 0433 08/19/16 0404  NA 134*  --  136 136  K 2.9* 3.1* 3.6 4.1  CL 91*  --  102 105  CO2 26  --  29 24  GLUCOSE 131*  --  95 97  BUN 8  --  8 9  CREATININE 0.84  --  0.74 0.84  CALCIUM 9.6  --  8.7* 8.6*  MG 1.2* 1.9  --   --   PHOS  --  2.7  --   --   PROT 8.0  --   --   --   ALBUMIN 4.3  --   --   --   AST 194*  --   --   --   ALT 83*  --   --   --   ALKPHOS 127*  --   --   --   BILITOT 3.1*  --  2.5*  --    Estimated Creatinine Clearance: 92 mL/min (by C-G formula based on SCr of 0.84 mg/dL).   No results for input(s): GLUCAP in the last 72 hours.  Medical History: Past Medical History:  Diagnosis Date  . Alcohol abuse   . Anxiety   . Depression   . Hypertension   . PTSD (post-traumatic stress disorder)     Medications:  Scheduled:  . enoxaparin (LOVENOX) injection  40 mg Subcutaneous Q24H  . folic acid  1 mg Oral Daily  . LORazepam  0-4 mg Intravenous Q6H   Followed by  . LORazepam  0-4 mg Intravenous Q12H  . multivitamin with minerals  1 tablet Oral Daily  . potassium chloride  20 mEq Oral Daily  . thiamine injection  100 mg Intravenous Daily  . thiamine  100 mg Oral Daily   Or  . thiamine  100 mg Intravenous Daily   Assessment: [name] with Intractable nausea vomiting in the setting of alcohol abuse   K= 4.1,   Last Mag 8/25= 1.9  Last Phos 8/25= 2.7  Plan:  Patient on KCL 20meq po daily. No supplementation needed. F/u electrolytes.  Sequoyah Ramone A 08/19/2016,8:53 AM

## 2016-08-19 NOTE — Progress Notes (Signed)
Pt being discharge home today. Discharge instructions reviewed with pt and all questions were answered. Prescriptions were given to pt to have filled. She will keep her scheduled appointment Tuesday. Will follow up with PCP as needed. PIV removed. She is leaving with all her belongings, will be transported via family.

## 2016-08-19 NOTE — Care Management Note (Signed)
Case Management Note  Patient Details  Name: Orvil FeilJoyce M Shew MRN: [id number] Date of Birth: [birth year]  Subjective/Objective:   Discussed discharge planning with Ms Rayburn Feltatmon. Uninsured Ms Rayburn Feltatmon reports that she [facility] [place] and will take any new scripts given to her at discharge to the [facility] tomorrow to get filled.                  Action/Plan:   Expected Discharge Date:                  Expected Discharge Plan:     In-House Referral:     Discharge planning Services     Post Acute Care Choice:    Choice offered to:     DME Arranged:    DME Agency:     HH Arranged:    HH Agency:     Status of Service:     If discussed at [employer] Length of Stay Meetings, dates discussed:    Additional Comments:  Mekenna Finau A, RN 08/19/2016, 8:30 AM

## 2016-08-24 ENCOUNTER — Emergency Department
Admission: EM | Admit: 2016-08-24 | Discharge: 2016-08-24 | Disposition: A | Discharge status: Home / Self Care | Payer: Self-pay | Attending: Student in an Organized Health Care Education/Training Program | Admitting: Student in an Organized Health Care Education/Training Program

## 2016-08-24 ENCOUNTER — Encounter: Payer: Self-pay | Admitting: Emergency Medicine

## 2016-08-24 ENCOUNTER — Emergency Department: Payer: Self-pay

## 2016-08-24 DIAGNOSIS — S93401A Sprain of unspecified ligament of right ankle, initial encounter: Secondary | ICD-10-CM

## 2016-08-24 DIAGNOSIS — I10 Essential (primary) hypertension: Secondary | ICD-10-CM | POA: Insufficient documentation

## 2016-08-24 DIAGNOSIS — S82831A Other fracture of upper and lower end of right fibula, initial encounter for closed fracture: Secondary | ICD-10-CM | POA: Insufficient documentation

## 2016-08-24 DIAGNOSIS — W1800XA Striking against unspecified object with subsequent fall, initial encounter: Secondary | ICD-10-CM | POA: Insufficient documentation

## 2016-08-24 DIAGNOSIS — W19XXXA Unspecified fall, initial encounter: Secondary | ICD-10-CM

## 2016-08-24 DIAGNOSIS — Y999 Unspecified external cause status: Secondary | ICD-10-CM | POA: Insufficient documentation

## 2016-08-24 DIAGNOSIS — W541XXA Struck by dog, initial encounter: Secondary | ICD-10-CM | POA: Insufficient documentation

## 2016-08-24 DIAGNOSIS — Y929 Unspecified place or not applicable: Secondary | ICD-10-CM | POA: Insufficient documentation

## 2016-08-24 DIAGNOSIS — F17210 Nicotine dependence, cigarettes, uncomplicated: Secondary | ICD-10-CM | POA: Insufficient documentation

## 2016-08-24 DIAGNOSIS — Z79899 Other long term (current) drug therapy: Secondary | ICD-10-CM | POA: Insufficient documentation

## 2016-08-24 DIAGNOSIS — Y9301 Activity, walking, marching and hiking: Secondary | ICD-10-CM | POA: Insufficient documentation

## 2016-08-24 MED ORDER — OXYCODONE HCL 5 MG PO TABS: 5.0000 mg | ORAL_TABLET | Freq: Three times a day (TID) | ORAL | 0 refills | Status: AC | PRN

## 2016-08-24 NOTE — ED Provider Notes (Signed)
[facility]-EMERGENCY DEPARTMENT Provider Note   CSN: [id number] Arrival date & time: 08/24/16  1609     History   Chief Complaint Chief Complaint  Patient presents with  . Fall    HPI Anna Lucas is a 45 y.o. female presents to the emergency department for evaluation of right proximal fibula pain and right lateral ankle pain. Patient states yesterday morning she was walking her dog, her dog pulled her and she missed the last step hitting her right knee on the ground. She developed lateral knee pain along the proximal fibula and lateral ankle pain. She has been bearing some weight with moderate to severe discomfort. She's been taking Advil with no improvement. She denies any other injury to her body, no groin or anterior thigh pain. She denies any lower back neck or head injury. She has been limping and crawling around her home today.  HPI  Past Medical History:  Diagnosis Date  . Alcohol abuse   . Anxiety   . Depression   . Hypertension   . PTSD (post-traumatic stress disorder)     Patient Active Problem List   Diagnosis Date Noted  . Intractable vomiting 08/17/2016  . Pyelonephritis 12/19/2015    Past Surgical History:  Procedure Laterality Date  . CESAREAN SECTION      OB History    No data available       Home Medications    Prior to Admission medications   Medication Sig Start Date End Date Taking? Authorizing Provider  albuterol (PROVENTIL HFA;VENTOLIN HFA) 108 (90 Base) MCG/ACT inhaler Inhale 2 puffs into the lungs every 6 (six) hours as needed for wheezing or shortness of breath. 02/02/16   Phineas Semen, MD  busPIRone (BUSPAR) 10 MG tablet Take 20 mg by mouth at bedtime.    Historical Provider, MD  FLUoxetine (PROZAC) 20 MG capsule Take 1 capsule (20 mg total) by mouth daily. 08/19/16   Shaune Pollack, MD  metoprolol (LOPRESSOR) 50 MG tablet Take 1 tablet (50 mg total) by mouth 2 (two) times daily. 12/23/15   Srikar Sudini, MD  omeprazole (PRILOSEC) 40 MG capsule  Take 40 mg by mouth daily.    Historical Provider, MD  ondansetron (ZOFRAN) 4 MG tablet Take 1 tablet (4 mg total) by mouth every 6 (six) hours as needed for nausea. Patient not taking: Reported on 02/02/2016 12/23/15   Milagros Loll, MD  oxyCODONE (ROXICODONE) 5 MG immediate release tablet Take 1 tablet (5 mg total) by mouth every 8 (eight) hours as needed for severe pain. 08/24/16 08/24/17  Evon Slack, PA-C  permethrin (ELIMITE) 5 % cream Apply topically once. Patient not taking: Reported on 02/02/2016 12/23/15   Milagros Loll, MD  traZODone (DESYREL) 50 MG tablet Take 2 tablets (100 mg total) by mouth at bedtime. 08/19/16   Shaune Pollack, MD    Family History Family History  Problem Relation Age of Onset  . CAD Paternal Grandfather   . Stroke Paternal Grandfather   . Diabetes Mellitus II Maternal Grandmother   . Emphysema Maternal Grandmother     Social History Social History  Substance Use Topics  . Smoking status: Current Every Day Smoker    Packs/day: 1.00    Types: Cigarettes  . Smokeless tobacco: Never Used  . Alcohol use Yes     Comment: occas     Allergies   Prednisone   Review of Systems Review of Systems  Constitutional: Negative for chills and fever.  HENT: Negative for ear pain and  sore throat.   Eyes: Negative for pain and visual disturbance.  Respiratory: Negative for cough and shortness of breath.   Cardiovascular: Negative for chest pain and palpitations.  Gastrointestinal: Negative for abdominal pain and vomiting.  Genitourinary: Negative for dysuria and hematuria.  Musculoskeletal: Positive for arthralgias and gait problem. Negative for back pain and neck pain.  Skin: Negative for color change, rash and wound.  Neurological: Negative for seizures and syncope.  All other systems reviewed and are negative.    Physical Exam Updated Vital Signs BP 117/87 (BP Location: Right Arm)   Pulse 96   Temp 98 F (36.7 C) (Oral)   Resp 16   Ht 5\' 5"  (1.651 m)    Wt 77.1 kg   LMP 07/24/2016   SpO2 97%   BMI 28.29 kg/m   Physical Exam  Constitutional: She is oriented to person, place, and time. She appears well-developed and well-nourished. No distress.  HENT:  Head: Normocephalic and atraumatic.  Mouth/Throat: Oropharynx is clear and moist.  Eyes: EOM are normal. Pupils are equal, round, and reactive to light. Right eye exhibits no discharge. Left eye exhibits no discharge.  Neck: Normal range of motion. Neck supple.  Cardiovascular: Normal rate, regular rhythm and intact distal pulses.   Pulmonary/Chest: Effort normal and breath sounds normal. No respiratory distress. She exhibits no tenderness.  Abdominal: Soft. She exhibits no distension. There is no tenderness.  Musculoskeletal:  Examination of the cervical thoracic and lumbar spine shows patient has no spinous process tenderness. Examination of the right lower extremity shows patient has full range of motion the hip with no discomfort. She has full knee flexion and extension with no discomfort. She is tender to palpation along the proximal fibular head, no laxity with valgus or varus stress testing to the knee. She has no swelling throughout the right lower extremity. She has 2+ dorsalis pedis pulse. Ankle is nontender along the medial malleolus but tender over the lateral malleolus. She has normal ankle plantarflexion and dorsiflexion. She is nervous intact to right lower extremity with no signs of compartment syndrome.  Neurological: She is alert and oriented to person, place, and time. She has normal reflexes.  Skin: Skin is warm and dry.  Psychiatric: She has a normal mood and affect. Her behavior is normal. Thought content normal.     ED Treatments / Results  Labs (all labs ordered are listed, but only abnormal results are displayed) Labs Reviewed - No data to display  EKG  EKG Interpretation None       Radiology Dg Ankle Complete Right  Result Date: 08/24/2016 CLINICAL DATA:   Fall down steps today. Right lateral ankle pain and right lateral knee pain. Initial encounter. EXAM: RIGHT ANKLE - COMPLETE 3+ VIEW COMPARISON:  None. FINDINGS: There is no evidence of fracture, dislocation, or joint effusion. There is no evidence of arthropathy or other focal bone abnormality. Soft tissues are unremarkable. IMPRESSION: Negative right ankle radiographs. Electronically Signed   By: Marin Robertshristopher  Mattern M.D.   On: 08/24/2016 16:59   Dg Knee Complete 4 Views Right  Result Date: 08/24/2016 CLINICAL DATA:  Fall down steps.  Right knee pain. EXAM: RIGHT KNEE - COMPLETE 4+ VIEW COMPARISON:  None. FINDINGS: A minimally displaced fractures present at the right fibular head. A small joint effusion is present. Minimal degenerative changes are noted at the patella. IMPRESSION: Nondisplaced right fibular head fracture. Electronically Signed   By: Marin Robertshristopher  Mattern M.D.   On: 08/24/2016 17:01  Procedures Procedures (including critical care time) SPLINT APPLICATION Date/Time: 6:34 PM Authorized by: Patience Musca Consent: Verbal consent obtained. Risks and benefits: risks, benefits and alternatives were discussed Consent given by: patient Splint applied by: ED tech Location details: Right knee immobilizer, right ankle stirrup  Splint type: Knee immobilizer, ankle stirrup prefabricated  Supplies used: Walker, knee immobilizer, ankle stirrup prefabricated  Post-procedure: The splinted body part was neurovascularly unchanged following the procedure. Patient tolerance: Patient tolerated the procedure well with no immediate complications.     Medications Ordered in ED Medications - No data to display   Initial Impression / Assessment and Plan / ED Course  I have reviewed the triage vital signs and the nursing notes.  Pertinent labs & imaging results that were available during my care of the patient were reviewed by me and considered in my medical decision making (see  chart for details).  Clinical Course    45 year old female with a fall one day ago. X-rays of the knee and ankle show no ankle fracture or acute bony abnormality the patient is found to have a nondisplaced proximal fibular head fracture. She is placed into a knee immobilizer and given a walker to help with ambulation. She is given a prescription for pain medication, oxycodone 5 mg by mouth every 8 hours as needed for pain quantity #15 was 0 refills. She will rest ice and elevate. Patient is also placed into a ankle stirrup splint. She will follow-up with orthopedics in 5-7 days.  Final Clinical Impressions(s) / ED Diagnoses   Final diagnoses:  Closed fracture of proximal fibula, right, initial encounter  Fall, initial encounter  Ankle sprain, right, initial encounter    New Prescriptions Discharge Medication List as of 08/24/2016  5:56 PM       Evon Slack, PA-C 08/24/16 1836    Willy Eddy, MD 08/24/16 [phone]

## 2016-08-24 NOTE — ED Triage Notes (Signed)
Reports tripping over a dog today, pain to right knee and ankle.  +PMS to foot

## 2016-08-24 NOTE — Discharge Instructions (Signed)
Please rest ice and elevate right lower extremity. Wear knee immobilizer when up and about. Use walker to help with ambulation. Take medication as needed for pain. Wear ankle splint when ambulating. You can bear weight as tolerated on to the right lower extremity. Follow-up with orthopedics in 5-7 days.

## 2017-07-25 ENCOUNTER — Telehealth: Payer: Self-pay | Admitting: Pharmacy Technician

## 2017-07-25 NOTE — Telephone Encounter (Signed)
Patient failed to provide 2018 proof of income.  No additional medication assistance will be provided by [facility] without the required proof of income documentation.  Patient notified by letter.  Laterica Matarazzo J. Jayce Kainz Care Manager [facility] 

## 2017-07-26 ENCOUNTER — Telehealth: Payer: Self-pay | Admitting: Pharmacy Technician

## 2017-07-26 NOTE — Telephone Encounter (Signed)
Patient eligible to receive medication assistance at [facility] through 2018, as long as eligibility requirements continue to be met.  Anna Lucas J. Kenan Moodie Care Manager [facility] 

## 2017-09-03 DIAGNOSIS — I10 Essential (primary) hypertension: Secondary | ICD-10-CM | POA: Insufficient documentation

## 2018-02-28 ENCOUNTER — Telehealth: Payer: Self-pay | Admitting: Pharmacy Technician

## 2018-02-28 NOTE — Telephone Encounter (Signed)
Patient failed to provide 2019 financial documentation.  No additional medication assistance will be provided by [facility] without the required proof of income documentation.  Patient notified by letter.  Julie-Ann Vanmaanen J. John Vasconcelos Care Manager [facility] 

## 2018-04-16 ENCOUNTER — Telehealth: Payer: Self-pay | Admitting: Pharmacy Technician

## 2018-04-16 NOTE — Telephone Encounter (Signed)
Received updated proof of income.  Patient eligible to receive medication assistance at [facility] through 2019, as long as eligibility requirements continue to be met.  [facility]

## 2018-12-03 DIAGNOSIS — L209 Atopic dermatitis, unspecified: Secondary | ICD-10-CM | POA: Insufficient documentation

## 2018-12-03 HISTORY — DX: Atopic dermatitis, unspecified: L20.9

## 2019-08-05 ENCOUNTER — Telehealth: Payer: Self-pay | Admitting: Pharmacy Technician

## 2019-08-05 NOTE — Telephone Encounter (Signed)
Patient failed to provide2020 financial documentation. No additional medication assistance will be provided by [facility] without the required proof of income documentation. Patient notified by letter.  Jaklyn Alen, CPhT [facility] 

## 2020-12-09 DIAGNOSIS — F10130 Alcohol abuse with withdrawal, uncomplicated: Secondary | ICD-10-CM | POA: Insufficient documentation

## 2020-12-13 ENCOUNTER — Other Ambulatory Visit: Payer: Self-pay | Admitting: Physician Assistant

## 2020-12-13 ENCOUNTER — Other Ambulatory Visit (HOSPITAL_COMMUNITY): Payer: Self-pay | Admitting: Physician Assistant

## 2020-12-13 DIAGNOSIS — R7401 Elevation of levels of liver transaminase levels: Secondary | ICD-10-CM

## 2020-12-22 ENCOUNTER — Other Ambulatory Visit: Payer: Self-pay

## 2020-12-22 ENCOUNTER — Ambulatory Visit
Admission: RE | Admit: 2020-12-22 | Discharge: 2020-12-22 | Disposition: A | Discharge status: Home / Self Care | Payer: 59 | Source: Ambulatory Visit | Attending: Physician Assistant | Admitting: Physician Assistant

## 2020-12-22 DIAGNOSIS — R7401 Elevation of levels of liver transaminase levels: Secondary | ICD-10-CM | POA: Diagnosis present

## 2020-12-22 DIAGNOSIS — R790 Abnormal level of blood mineral: Secondary | ICD-10-CM | POA: Insufficient documentation

## 2021-01-02 DIAGNOSIS — K047 Periapical abscess without sinus: Secondary | ICD-10-CM

## 2021-01-02 HISTORY — DX: Periapical abscess without sinus: K04.7

## 2021-01-03 ENCOUNTER — Encounter: Payer: Self-pay | Admitting: Physician Assistant

## 2021-02-07 ENCOUNTER — Encounter: Payer: Self-pay | Admitting: Gastroenterology

## 2021-02-07 ENCOUNTER — Other Ambulatory Visit: Payer: Self-pay

## 2021-02-07 ENCOUNTER — Ambulatory Visit (INDEPENDENT_AMBULATORY_CARE_PROVIDER_SITE_OTHER): Payer: 59 | Admitting: Gastroenterology

## 2021-02-07 VITALS — BP 149/92 | HR 86 | Wt 147.6 lb

## 2021-02-07 DIAGNOSIS — Z87898 Personal history of other specified conditions: Secondary | ICD-10-CM

## 2021-02-07 DIAGNOSIS — Z1211 Encounter for screening for malignant neoplasm of colon: Secondary | ICD-10-CM

## 2021-02-07 DIAGNOSIS — D696 Thrombocytopenia, unspecified: Secondary | ICD-10-CM

## 2021-02-07 DIAGNOSIS — R945 Abnormal results of liver function studies: Secondary | ICD-10-CM | POA: Diagnosis not present

## 2021-02-07 DIAGNOSIS — R7989 Other specified abnormal findings of blood chemistry: Secondary | ICD-10-CM

## 2021-02-07 MED ORDER — PEG 3350-KCL-NA BICARB-NACL 420 G PO SOLR: 4000.0000 mL | Freq: Once | ORAL | 0 refills | Status: DC

## 2021-02-07 MED ORDER — PEG 3350-KCL-NA BICARB-NACL 420 G PO SOLR: 4000.0000 mL | Freq: Once | ORAL | 0 refills | Status: AC

## 2021-02-07 NOTE — Progress Notes (Signed)
Wyline Mood MD, MRCP(U.K) [address]  [address]  [place], [place] [address]  Main: [phone]  Fax: [phone]   Gastroenterology Consultation  Referring Provider:     Gorden Harms, PA-C Primary Care Physician:  Center, [facility] Primary Gastroenterologist:  Dr. Wyline Mood  Reason for Consultation:     Elevated iron levels and abnormal liver function tests        HPI:   Anna Lucas is a 50 y.o. y/o female referred for consultation & management  by. Center, [facility] [employer].     She has scanned results of labs checked on December 27, 2020.  Albumin 3.9, alkaline phosphatase 111, AST 34, ALT 43, iron saturation over 92%, ANA negative .  Hemoglobin 12.9 g with a platelet count of 117 INR 1.0, B12 642, hepatitis C virus antibody less than 0.1, TSH normal, ferritin 1134.  Prior CMP from 2017: ALT of 83, AST 194, alkaline phosphatase of 127 total bilirubin of 3.1.  12/22/2020: Right upper quadrant ultrasound shows hepatic steatosis  She denies any prior liver disease.  No family history of liver disease.  Denies any tattoos.  No incarceration.  She has used cocaine over 30 years back.  She has been drinking alcohol on a regular basis since age of 78.  They have been.  In her life as she has drank in excess.  Presently drinks up to 8 drinks a week.  Also consumes a lot of soda.  No prior colonoscopy.  No family history of colon cancer or polyps.  She has been on a multivitamin in the past which she has stopped.  Past Medical History:  Diagnosis Date  . Alcohol abuse   . Anxiety   . Depression   . Hypertension   . PTSD (post-traumatic stress disorder)     Past Surgical History:  Procedure Laterality Date  . CESAREAN SECTION      Prior to Admission medications   Medication Sig Start Date End Date Taking? Authorizing Provider  albuterol (PROVENTIL HFA;VENTOLIN HFA) 108 (90 Base) MCG/ACT inhaler Inhale 2 puffs into the lungs every 6  (six) hours as needed for wheezing or shortness of breath. 02/02/16   Phineas Semen, MD  busPIRone (BUSPAR) 10 MG tablet Take 20 mg by mouth at bedtime.    [provider]  FLUoxetine (PROZAC) 20 MG capsule Take 1 capsule (20 mg total) by mouth daily. 08/19/16   Shaune Pollack, MD  metoprolol (LOPRESSOR) 50 MG tablet Take 1 tablet (50 mg total) by mouth 2 (two) times daily. 12/23/15   Milagros Loll, MD  omeprazole (PRILOSEC) 40 MG capsule Take 40 mg by mouth daily.    [provider]  ondansetron (ZOFRAN) 4 MG tablet Take 1 tablet (4 mg total) by mouth every 6 (six) hours as needed for nausea. Patient not taking: Reported on 02/02/2016 12/23/15   Milagros Loll, MD  permethrin (ELIMITE) 5 % cream Apply topically once. Patient not taking: Reported on 02/02/2016 12/23/15   Milagros Loll, MD  traZODone (DESYREL) 50 MG tablet Take 2 tablets (100 mg total) by mouth at bedtime. 08/19/16   Shaune Pollack, MD    Family History  Problem Relation Age of Onset  . CAD Paternal Grandfather   . Stroke Paternal Grandfather   . Diabetes Mellitus II Maternal Grandmother   . Emphysema Maternal Grandmother      Social History   Tobacco Use  . Smoking status: Current Every Day Smoker  Packs/day: 1.00    Types: Cigarettes  . Smokeless tobacco: Never Used  Substance Use Topics  . Alcohol use: Yes    Comment: occas    Allergies as of 02/07/2021 - Review Complete 08/24/2016  Allergen Reaction Noted  . Prednisone  12/19/2015    Review of Systems:    All systems reviewed and negative except where noted in HPI.   Physical Exam:  There were no vitals taken for this visit. No LMP recorded. Psych:  Alert and cooperative. Normal mood and affect. General:   Alert,  Well-developed, well-nourished, pleasant and cooperative in NAD Head:  Normocephalic and atraumatic. Eyes:  Sclera clear, no icterus.   Conjunctiva pink. Lungs:  Respirations even and unlabored.  Clear throughout to auscultation.    No wheezes, crackles, or rhonchi. No acute distress. Heart:  Regular rate and rhythm; no murmurs, clicks, rubs, or gallops. Abdomen:  Normal bowel sounds.  No bruits.  Soft, non-tender and non-distended without masses, hepatosplenomegaly or hernias noted.  No guarding or rebound tenderness.    Neurologic:  Alert and oriented x3;  grossly normal neurologically. Psych:  Alert and cooperative. Normal mood and affect.  Imaging Studies: No results found.  Assessment and Plan:   Anna Lucas is a 50 y.o. y/o female has been referred for abnormal transaminases, low platelet count and hepatic steatosis seen on right upper quadrant ultrasound.  Ferritin is over thousand with iron percentage saturation 92%.  Elevated ferritin could be a marker of acute inflammation in addition to being seen in hereditary hemochromatosis.  She has consumed alcohol in excess for many years which could have caused her to develop liver inflammation and lead to elevated ferritin or she may also have coexisting Ejigiri hemochromatosis.  Plan 1.  Check HFE gene mutation 2.  Full autoimmune and viral hepatitis work-up 3.  Advised to stop drinking alcohol completely and assess if the transaminases and ferritin levels return back to normal. 4.  Liver elastography to determine if she has features of cirrhosis.  She does have thrombocytopenia which could be either from the alcohol or due to portal hypertension. 5.  Colon cancer screening average risk we will schedule colonoscopy.  I have discussed alternative options, risks & benefits,  which include, but are not limited to, bleeding, infection, perforation,respiratory complication & drug reaction.  The patient agrees with this plan & written consent will be obtained.     Follow up in 4 to 6 weeks  Dr Wyline Mood MD,MRCP(U.K)

## 2021-02-07 NOTE — Patient Instructions (Signed)
You have a scheduled Ultrasound on February 22, 2021 at 9:30am. Please arrive at the [facility] entrance of the hospital at 9:15am to get check in. Nothing to eat or drink after midnight the night before.

## 2021-02-07 NOTE — Progress Notes (Signed)
Had to resend bowel prep to a different pharmacy.

## 2021-02-11 LAB — ALPHA-1-ANTITRYPSIN: A-1 Antitrypsin: 123 mg/dL (ref 101–187)

## 2021-02-11 LAB — MITOCHONDRIAL/SMOOTH MUSCLE AB PNL
Mitochondrial Ab: <20 Units (ref 0.0–20.0)
Smooth Muscle Ab: 4 Units (ref 0–19)

## 2021-02-11 LAB — CELIAC DISEASE AB SCREEN W/RFX
Antigliadin Abs, IgA: 31 units — ABNORMAL HIGH (ref 0–19)
Transglutaminase IgA: <2 U/mL (ref 0–3)

## 2021-02-11 LAB — HEPATITIS B SURFACE ANTIGEN: Hepatitis B Surface Ag: NEGATIVE

## 2021-02-11 LAB — HEPATITIS B E ANTIBODY: Hep B E Ab: NEGATIVE

## 2021-02-11 LAB — IRON,TIBC AND FERRITIN PANEL
Ferritin: 890 ng/mL — ABNORMAL HIGH (ref 15–150)
Iron Saturation: 34 % (ref 15–55)
Iron: 106 ug/dL (ref 27–159)
Total Iron Binding Capacity: 315 ug/dL (ref 250–450)
UIBC: 209 ug/dL (ref 131–425)

## 2021-02-11 LAB — IMMUNOGLOBULINS A/E/G/M, SERUM
IgA/Immunoglobulin A, Serum: 411 mg/dL — ABNORMAL HIGH (ref 87–352)
IgE (Immunoglobulin E), Serum: 217 IU/mL (ref 6–495)
IgG (Immunoglobin G), Serum: 1154 mg/dL (ref 586–1602)
IgM (Immunoglobulin M), Srm: 97 mg/dL (ref 26–217)

## 2021-02-11 LAB — CK: Total CK: 153 U/L (ref 32–182)

## 2021-02-11 LAB — HIV ANTIBODY (ROUTINE TESTING W REFLEX): HIV Screen 4th Generation wRfx: NONREACTIVE

## 2021-02-11 LAB — HEPATITIS B CORE ANTIBODY, TOTAL: Hep B Core Total Ab: NEGATIVE

## 2021-02-11 LAB — CERULOPLASMIN: Ceruloplasmin: 19.1 mg/dL (ref 19.0–39.0)

## 2021-02-11 LAB — HEPATITIS A ANTIBODY, TOTAL: hep A Total Ab: NEGATIVE

## 2021-02-11 LAB — HEPATITIS B SURFACE ANTIBODY,QUALITATIVE: Hep B Surface Ab, Qual: REACTIVE

## 2021-02-11 LAB — HEPATITIS B E ANTIGEN: Hep B E Ag: NEGATIVE

## 2021-02-11 LAB — ANTI-MICROSOMAL ANTIBODY LIVER / KIDNEY: LKM1 Ab: 1 Units (ref 0.0–20.0)

## 2021-02-17 DIAGNOSIS — M109 Gout, unspecified: Secondary | ICD-10-CM | POA: Insufficient documentation

## 2021-02-17 HISTORY — DX: Gout, unspecified: M10.9

## 2021-02-22 ENCOUNTER — Ambulatory Visit: Payer: 59

## 2021-02-28 ENCOUNTER — Other Ambulatory Visit: Payer: 59

## 2021-03-01 ENCOUNTER — Telehealth: Payer: Self-pay | Admitting: Gastroenterology

## 2021-03-01 NOTE — Telephone Encounter (Signed)
Patient lvm and is ready to r/s her procedures.

## 2021-03-02 ENCOUNTER — Encounter: Admission: RE | Payer: Self-pay | Source: Home / Self Care

## 2021-03-02 ENCOUNTER — Ambulatory Visit: Admission: RE | Admit: 2021-03-02 | Payer: 59 | Source: Home / Self Care | Admitting: Gastroenterology

## 2021-03-02 SURGERY — COLONOSCOPY WITH PROPOFOL
Anesthesia: General

## 2021-03-02 NOTE — Telephone Encounter (Signed)
Patient called second time asking for call back to reschedule her procedure.

## 2021-03-07 ENCOUNTER — Other Ambulatory Visit: Payer: Self-pay

## 2021-03-07 DIAGNOSIS — Z1211 Encounter for screening for malignant neoplasm of colon: Secondary | ICD-10-CM

## 2021-03-07 NOTE — Telephone Encounter (Signed)
Spoke with pt and was able to reschedule procedure to 03-27-21. 

## 2021-03-13 ENCOUNTER — Other Ambulatory Visit: Payer: Self-pay

## 2021-03-13 ENCOUNTER — Ambulatory Visit
Admission: RE | Admit: 2021-03-13 | Discharge: 2021-03-13 | Disposition: A | Discharge status: Home / Self Care | Payer: 59 | Source: Ambulatory Visit | Attending: Gastroenterology | Admitting: Gastroenterology

## 2021-03-13 DIAGNOSIS — D696 Thrombocytopenia, unspecified: Secondary | ICD-10-CM | POA: Diagnosis present

## 2021-03-13 DIAGNOSIS — R7989 Other specified abnormal findings of blood chemistry: Secondary | ICD-10-CM

## 2021-03-13 DIAGNOSIS — R945 Abnormal results of liver function studies: Secondary | ICD-10-CM | POA: Diagnosis present

## 2021-03-14 ENCOUNTER — Telehealth: Payer: Self-pay

## 2021-03-14 ENCOUNTER — Ambulatory Visit (INDEPENDENT_AMBULATORY_CARE_PROVIDER_SITE_OTHER): Payer: 59 | Admitting: Gastroenterology

## 2021-03-14 VITALS — BP 144/99 | HR 93 | Ht 65.0 in | Wt 147.0 lb

## 2021-03-14 DIAGNOSIS — R894 Abnormal immunological findings in specimens from other organs, systems and tissues: Secondary | ICD-10-CM

## 2021-03-14 DIAGNOSIS — R7989 Other specified abnormal findings of blood chemistry: Secondary | ICD-10-CM

## 2021-03-14 DIAGNOSIS — Z87898 Personal history of other specified conditions: Secondary | ICD-10-CM

## 2021-03-14 DIAGNOSIS — R945 Abnormal results of liver function studies: Secondary | ICD-10-CM | POA: Diagnosis not present

## 2021-03-14 NOTE — Progress Notes (Signed)
The nurse manager and I went in to speak with the patient regarding her emotional state. I was informed by provider to go in and check on the patient because there were concerns of mental health issue. Patient states she is just having a bad day and plans to reach out to the [facility] to speak with a therapist. Memory Argue to reach out to our [facility] counseling services for her. Patient declined that offer. We asked the appropriate questions to make sure the patient was not planning to harm herself. Patient verbalized she would be fine.

## 2021-03-14 NOTE — Progress Notes (Addendum)
Wyline Mood MD, MRCP(U.K) [address]  [address]  [place], [place] [address]  Main: [phone]  Fax: [phone]   Primary Care Physician: [facility]  Primary Gastroenterologist:  Dr. Wyline Mood   Abnormal LFT's Follow up .   HPI: Anna Lucas is a 50 y.o. female    Summary of history :  Initially seen 02/07/2021 for abnormal LFT's . scanned results of labs checked on December 27, 2020.  Albumin 3.9, alkaline phosphatase 111, AST 34, ALT 43, iron saturation over 92%, ANA negative .  Hemoglobin 12.9 g with a platelet count of 117 INR 1.0, B12 642, hepatitis C virus antibody less than 0.1, TSH normal, ferritin 1134.Prior CMP from 2017: ALT of 83, AST 194, alkaline phosphatase of 127 total bilirubin of 3.1.  12/22/2020: Right upper quadrant ultrasound shows hepatic steatosis  She denied any prior liver disease.  No family history of liver disease, tattoos,  incarceration.  She has used cocaine over 30 years back.  She has been drinking alcohol on a regular basis since age of 89.   Presently drinks up to 8 drinks a week.  Also consumes a lot of soda.  No prior colonoscopy.  No family history of colon cancer or polyps.  She has been on a multivitamin in the past which she has stopped.    Interval history   02/07/2021-03/14/2021  03/13/2021: Liver elastography no abnormality seen median  K PA 4.2. 02/07/2021: Hepatitis B surface antibody reactive, HIV, viral hepatitis, autoimmune markers, ceruloplasmin, negative.  Antigliadin antibody positive at 33 alpha-1 antitrypsin negative.Hepatitis A total antibody negative.  Continues to drink alcohol last drink of alcohol was earlier today.  Strong smell of alcohol and tobacco when I entered the room.  She was very upset, crying saying related to PTSD and she sees a therapist.  I offered her help in terms of referrals and asked her if she would like to see someone for the same.  I also asked her if she would like  help with stopping alcohol.  Current Outpatient Medications  Medication Sig Dispense Refill  . albuterol (PROVENTIL HFA;VENTOLIN HFA) 108 (90 Base) MCG/ACT inhaler Inhale 2 puffs into the lungs every 6 (six) hours as needed for wheezing or shortness of breath. 1 Inhaler 0  . busPIRone (BUSPAR) 10 MG tablet Take 20 mg by mouth at bedtime.    . citalopram (CELEXA) 40 MG tablet Take 40 mg by mouth once.    Marland Kitchen FLUoxetine (PROZAC) 20 MG capsule Take 1 capsule (20 mg total) by mouth daily. 5 capsule 0  . hydrOXYzine (ATARAX/VISTARIL) 50 MG tablet Take 50 mg by mouth 3 (three) times daily as needed.    . metoprolol (LOPRESSOR) 50 MG tablet Take 1 tablet (50 mg total) by mouth 2 (two) times daily. 60 tablet 0  . omeprazole (PRILOSEC) 40 MG capsule Take 40 mg by mouth daily.    . ondansetron (ZOFRAN) 4 MG tablet Take 1 tablet (4 mg total) by mouth every 6 (six) hours as needed for nausea. (Patient not taking: No sig reported) 20 tablet 0  . permethrin (ELIMITE) 5 % cream Apply topically once. (Patient not taking: No sig reported) 60 g 0  . traZODone (DESYREL) 50 MG tablet Take 2 tablets (100 mg total) by mouth at bedtime. 5 tablet 0   No current facility-administered medications for this visit.    Allergies as of 03/14/2021 - Review Complete 03/14/2021  Allergen Reaction Noted  . Prednisone  12/19/2015    ROS:  General: Negative for anorexia, weight loss, fever, chills, fatigue, weakness. ENT: Negative for hoarseness, difficulty swallowing , nasal congestion. CV: Negative for chest pain, angina, palpitations, dyspnea on exertion, peripheral edema.  Respiratory: Negative for dyspnea at rest, dyspnea on exertion, cough, sputum, wheezing.  GI: See history of present illness. GU:  Negative for dysuria, hematuria, urinary incontinence, urinary frequency, nocturnal urination.  Endo: Negative for unusual weight change.    Physical Examination:   BP (!) 144/99   Pulse 93   Ht 5\' 5"  (1.651 m)   Wt  147 lb (66.7 kg)   BMI 24.46 kg/m   General: Well-nourished, well-developed in no acute distress.  Eyes: No icterus. Conjunctivae pink. Poor dentition Extremities: No lower extremity edema. No clubbing or deformities. Neuro: Alert and oriented x 3.  Grossly intact. Skin: Warm and dry, no jaundice.   Psych: Alert and cooperative, normal mood and affect.   Imaging Studies: ELASTOGRAPHY LIVER  Result Date: 03/13/2021 CLINICAL DATA:  Elevated liver function tests.  Thrombocytopenia. EXAM: 03/15/2021 LIVER ELASTOGRAPHY TECHNIQUE: Sonography of the liver was performed. In addition, ultrasound elastography evaluation of the liver was performed. A region of interest was placed within the right lobe of the liver. Following application of a compressive sonographic pulse, tissue compressibility was assessed. Multiple assessments were performed at the selected site. Median tissue compressibility was determined. Previously, hepatic stiffness was assessed by shear wave velocity. Based on recently published Society of Radiologists in Ultrasound consensus article, reporting is now recommended to be performed in the SI units of pressure (kiloPascals) representing hepatic stiffness/elasticity. The obtained result is compared to the published reference standards. (cACLD = compensated Advanced Chronic Liver Disease) COMPARISON:  Abdomen ultrasound on 12/22/2020 FINDINGS: Liver: No focal lesion identified. Within normal limits in parenchymal echogenicity. Portal vein is patent on color Doppler imaging with normal direction of blood flow towards the liver. ULTRASOUND HEPATIC ELASTOGRAPHY Device: Siemens Helix VTQ Patient position: Supine Transducer: 6C1 Number of measurements: 10 Hepatic segment:  8 Median kPa: 4.2 IQR: 0.4 IQR/Median kPa ratio: 0.1 Data quality:  Good Diagnostic category:  < or = 5 kPa: high probability of being normal The use of hepatic elastography is applicable to patients with viral hepatitis and  non-alcoholic fatty liver disease. At this time, there is insufficient data for the referenced cut-off values and use in other causes of liver disease, including alcoholic liver disease. Patients, however, may be assessed by elastography and serve as their own reference standard/baseline. In patients with non-alcoholic liver disease, the values suggesting compensated advanced chronic liver disease (cACLD) may be lower, and patients may need additional testing with elasticity results of 7-9 kPa. Please note that abnormal hepatic elasticity and shear wave velocities may also be identified in clinical settings other than with hepatic fibrosis, such as: acute hepatitis, elevated right heart and central venous pressures including use of beta blockers, veno-occlusive disease (Budd-Chiari), infiltrative processes such as mastocytosis/amyloidosis/infiltrative tumor/lymphoma, extrahepatic cholestasis, with hyperemia in the post-prandial state, and with liver transplantation. Correlation with patient history, laboratory data, and clinical condition recommended. Diagnostic Categories: < or =5 kPa: high probability of being normal < or =9 kPa: in the absence of other known clinical signs, rules out cACLD >9 kPa and ?13 kPa: suggestive of cACLD, but needs further testing >13 kPa: highly suggestive of cACLD > or =17 kPa: highly suggestive of cACLD with an increased probability of clinically significant portal hypertension IMPRESSION: ULTRASOUND LIVER: No abnormality identified. ULTRASOUND HEPATIC ELASTOGRAPHY: Median  kPa:  4.2 Diagnostic category:  < or = 5 kPa: high probability of being normal Electronically Signed   By: Danae Orleans M.D.   On: 03/13/2021 14:36    Assessment and Plan:   Anna Lucas is a 50 y.o. y/o female here to follow up for elevated LFTS- abnormal transaminases, low platelet count and hepatic steatosis seen on right upper quadrant ultrasound.  Ferritin is over thousand with iron percentage saturation  92%.  Elevated ferritin could be a marker of acute inflammation in addition to being seen in hereditary hemochromatosis.  She has consumed alcohol in excess for many years which could have caused her to develop liver inflammation and lead to elevated ferritin .  I asked her if she intended any self-harm she said no.  Does not intend to harm anybody else either.  I did say that if she were to get such thoughts she needs to contact us or go to the emergency room or see a physician.    Plan 1.  Check HFE gene mutation 2.    Antigliadin antibodies positive.  Will need an EGD to rule out celiac disease.  Colonoscopy can be performed at the same time for colon cancer screening.  Recheck hepatic function panel.  At this point of time I do not think she is ready to undergo these procedures.  We will reassess her at her next visit. 3.  Advised to stop drinking alcohol completely and assess if the transaminases and ferritin levels return back to normal.  Advised her to join alcoholic Anonymous 4.    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  Needs hepatitis A vaccine  Dr Wyline Mood  MD,MRCP [facility]) Follow up in 4 months

## 2021-03-14 NOTE — Telephone Encounter (Signed)
error 

## 2021-03-14 NOTE — Addendum Note (Signed)
Addended by: Wilnette Kales on: 03/14/2021 02:23 PM   Modules accepted: Orders

## 2021-03-27 ENCOUNTER — Ambulatory Visit: Admit: 2021-03-27 | Payer: 59 | Admitting: Gastroenterology

## 2021-03-27 SURGERY — COLONOSCOPY WITH PROPOFOL
Anesthesia: General

## 2021-06-14 IMAGING — US US LIVER ELASTOGRAPHY
1 series · 12 of 25 positions shown · non-contrast
Comparison: Abdomen ultrasound on 12/22/2020

CLINICAL DATA: Elevated liver function tests.  Thrombocytopenia.

EXAM:
US LIVER ELASTOGRAPHY
TECHNIQUE: Sonography of the liver was performed. In addition, ultrasound
elastography evaluation of the liver was performed. A region of
interest was placed within the right lobe of the liver. Following
application of a compressive sonographic pulse, tissue
compressibility was assessed. Multiple assessments were performed at
the selected site. Median tissue compressibility was determined.
Previously, hepatic stiffness was assessed by shear wave velocity.
Based on recently published Society of Radiologists in Ultrasound
consensus article, reporting is now recommended to be performed in
the SI units of pressure (kiloPascals) representing hepatic
stiffness/elasticity. The obtained result is compared to the
published reference standards. (cACLD = compensated Advanced Chronic
Liver Disease)

[Series 1: us elastography liver · 12 of 35 slices shown]
[im 2/35]
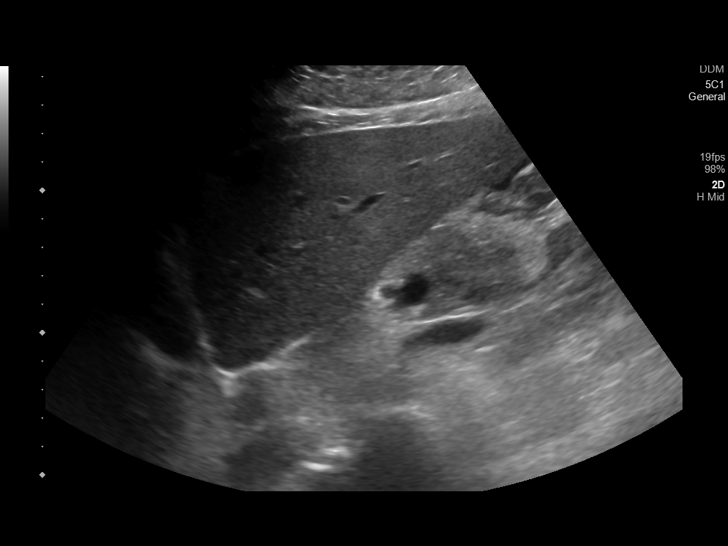
[im 5/35]
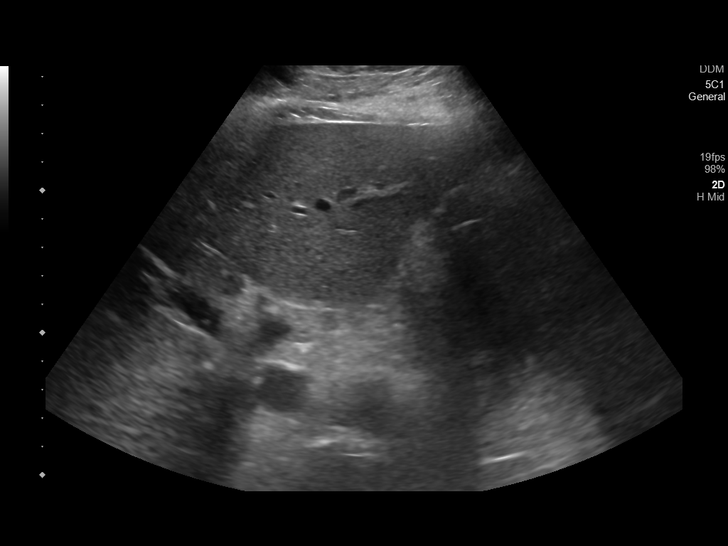
[im 8/35]
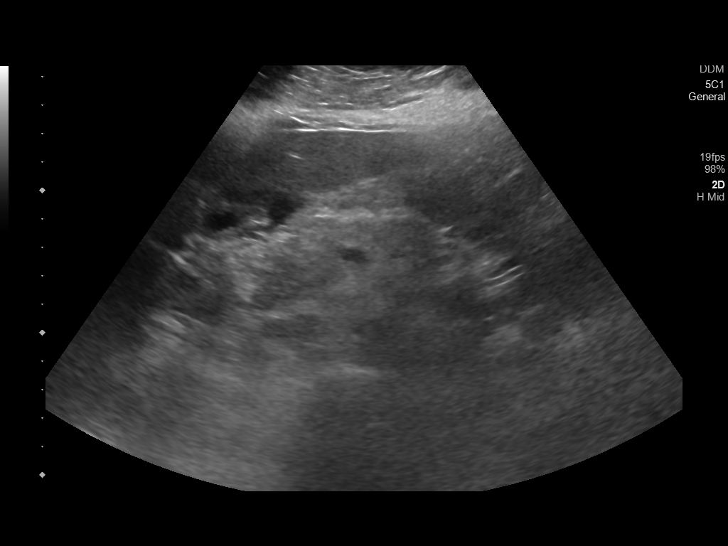
[im 10/35]
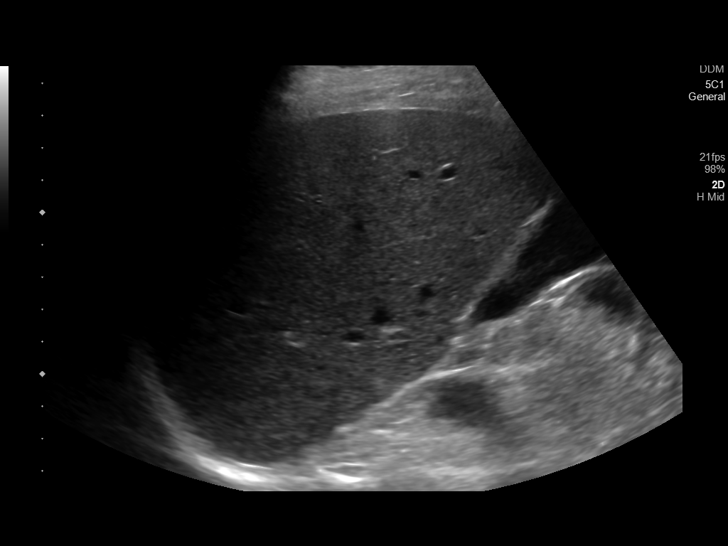
[im 13/35]
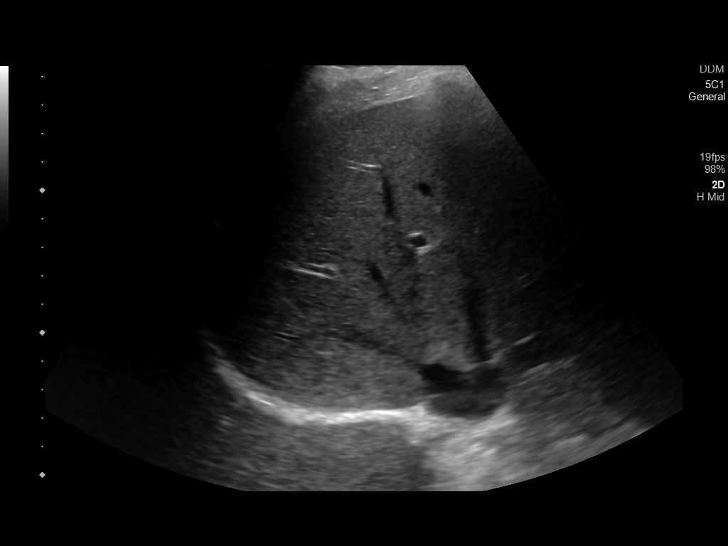
[im 16/35]
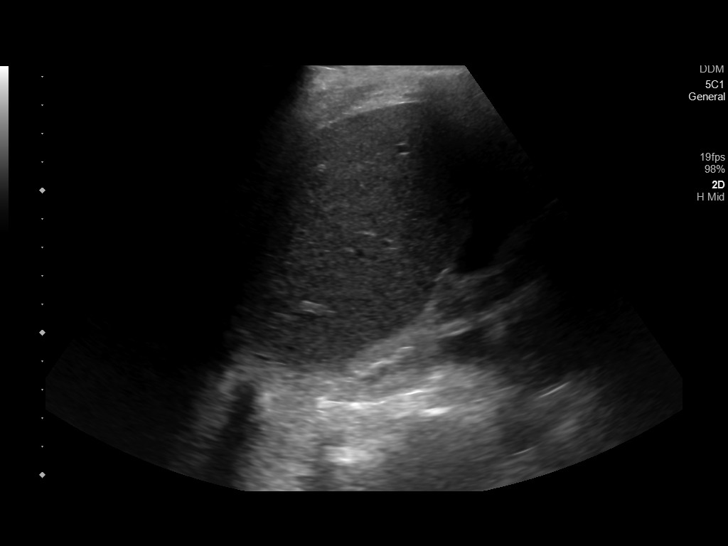
[im 19/35]
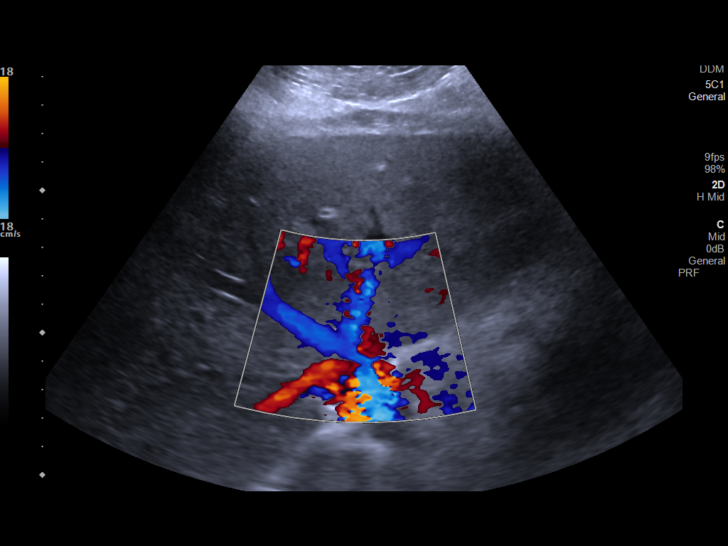
[im 22/35]
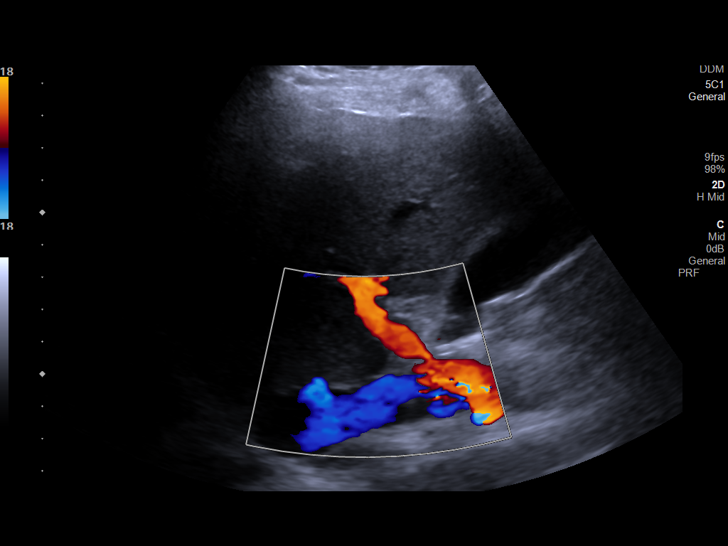
[im 25/35]
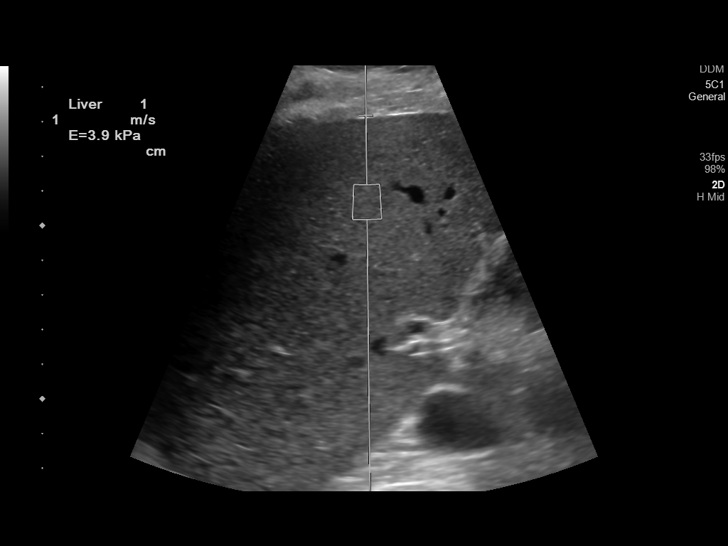
[im 27/35]
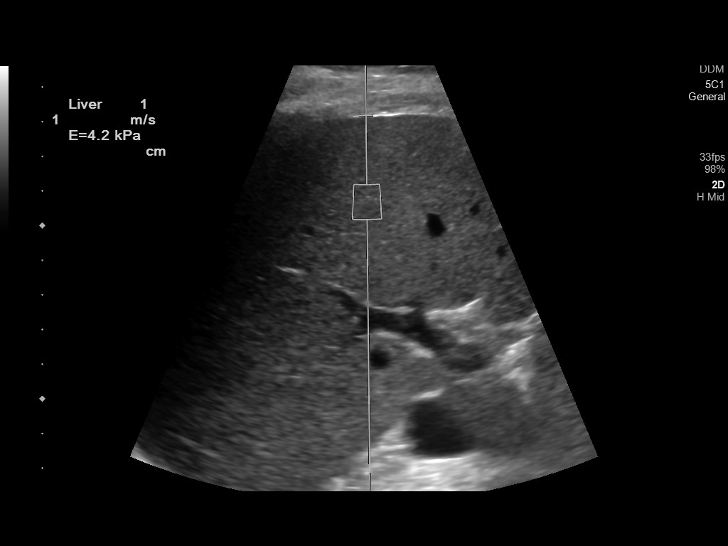
[im 30/35]
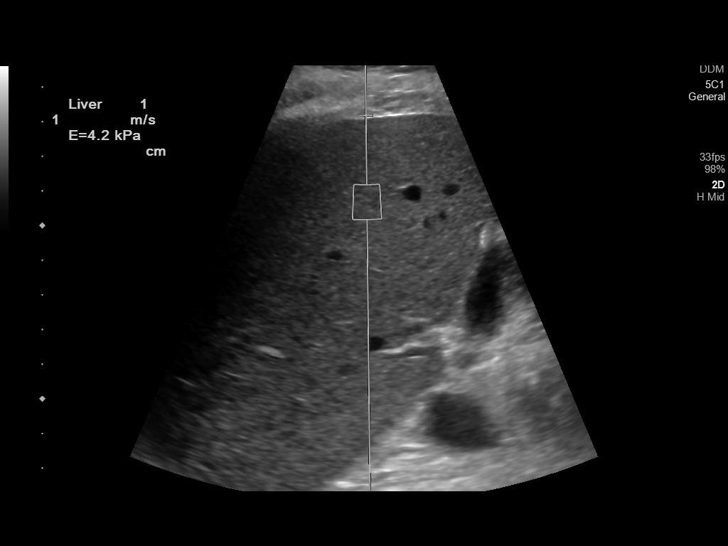
[im 33/35]
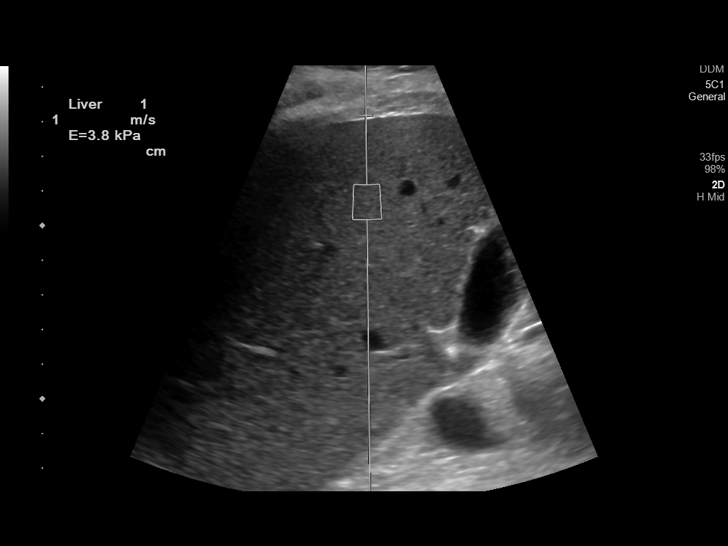

[12 of 25 positions shown; findings below may reference images not displayed]

FINDINGS: Liver: No focal lesion identified. Within normal limits in
parenchymal echogenicity. Portal vein is patent on color Doppler
imaging with normal direction of blood flow towards the liver.

ULTRASOUND HEPATIC ELASTOGRAPHY

Device: Siemens Helix VTQ

Patient position: Supine

Transducer: 6C1

Number of measurements: 10

Hepatic segment:  8

Median kPa:

IQR:

IQR/Median kPa ratio:

Data quality:  Good

Diagnostic category:  < or = 5 kPa: high probability of being normal

The use of hepatic elastography is applicable to patients with viral
hepatitis and non-alcoholic fatty liver disease. At this time, there
is insufficient data for the referenced cut-off values and use in
other causes of liver disease, including alcoholic liver disease.
Patients, however, may be assessed by elastography and serve as
their own reference standard/baseline.

In patients with non-alcoholic liver disease, the values suggesting
compensated advanced chronic liver disease (cACLD) may be lower, and
patients may need additional testing with elasticity results of [DATE]
kPa.

Please note that abnormal hepatic elasticity and shear wave
velocities may also be identified in clinical settings other than
with hepatic fibrosis, such as: acute hepatitis, elevated right
heart and central venous pressures including use of beta blockers,
Michie disease (Michou), infiltrative processes such as
mastocytosis/amyloidosis/infiltrative tumor/lymphoma, extrahepatic
cholestasis, with hyperemia in the post-prandial state, and with
liver transplantation. Correlation with patient history, laboratory
data, and clinical condition recommended.

Diagnostic Categories:

< or =5 kPa: high probability of being normal

< or =9 kPa: in the absence of other known clinical signs, rules [DATE] kPa and ?13 kPa: suggestive of cACLD, but needs further testing

>13 kPa: highly suggestive of cACLD

> or =17 kPa: highly suggestive of cACLD with an increased
probability of clinically significant portal hypertension
IMPRESSION: ULTRASOUND LIVER:

No abnormality identified.

ULTRASOUND HEPATIC ELASTOGRAPHY:

Median kPa:

Diagnostic category:  < or = 5 kPa: high probability of being normal

## 2022-05-08 DIAGNOSIS — N951 Menopausal and female climacteric states: Secondary | ICD-10-CM | POA: Insufficient documentation

## 2022-05-10 ENCOUNTER — Emergency Department: Payer: 59

## 2022-05-10 ENCOUNTER — Encounter: Payer: Self-pay | Admitting: Emergency Medicine

## 2022-05-10 ENCOUNTER — Inpatient Hospital Stay
Admission: EM | Admit: 2022-05-10 | Discharge: 2022-05-15 | DRG: 432 | Disposition: A | Discharge status: Home / Self Care | Payer: 59 | Attending: Internal Medicine | Admitting: Internal Medicine

## 2022-05-10 ENCOUNTER — Other Ambulatory Visit: Payer: Self-pay

## 2022-05-10 DIAGNOSIS — Z79899 Other long term (current) drug therapy: Secondary | ICD-10-CM

## 2022-05-10 DIAGNOSIS — K219 Gastro-esophageal reflux disease without esophagitis: Secondary | ICD-10-CM | POA: Diagnosis present

## 2022-05-10 DIAGNOSIS — K7290 Hepatic failure, unspecified without coma: Secondary | ICD-10-CM | POA: Diagnosis not present

## 2022-05-10 DIAGNOSIS — R112 Nausea with vomiting, unspecified: Secondary | ICD-10-CM | POA: Diagnosis present

## 2022-05-10 DIAGNOSIS — F17210 Nicotine dependence, cigarettes, uncomplicated: Secondary | ICD-10-CM | POA: Diagnosis present

## 2022-05-10 DIAGNOSIS — G629 Polyneuropathy, unspecified: Secondary | ICD-10-CM | POA: Diagnosis present

## 2022-05-10 DIAGNOSIS — F32A Depression, unspecified: Secondary | ICD-10-CM | POA: Diagnosis present

## 2022-05-10 DIAGNOSIS — D72819 Decreased white blood cell count, unspecified: Secondary | ICD-10-CM | POA: Diagnosis not present

## 2022-05-10 DIAGNOSIS — D689 Coagulation defect, unspecified: Secondary | ICD-10-CM | POA: Diagnosis present

## 2022-05-10 DIAGNOSIS — K7010 Alcoholic hepatitis without ascites: Principal | ICD-10-CM | POA: Diagnosis present

## 2022-05-10 DIAGNOSIS — Z823 Family history of stroke: Secondary | ICD-10-CM

## 2022-05-10 DIAGNOSIS — D7589 Other specified diseases of blood and blood-forming organs: Secondary | ICD-10-CM | POA: Clinically undetermined

## 2022-05-10 DIAGNOSIS — I10 Essential (primary) hypertension: Secondary | ICD-10-CM | POA: Diagnosis present

## 2022-05-10 DIAGNOSIS — K7200 Acute and subacute hepatic failure without coma: Secondary | ICD-10-CM | POA: Diagnosis present

## 2022-05-10 DIAGNOSIS — B179 Acute viral hepatitis, unspecified: Secondary | ICD-10-CM | POA: Diagnosis present

## 2022-05-10 DIAGNOSIS — Z888 Allergy status to other drugs, medicaments and biological substances status: Secondary | ICD-10-CM

## 2022-05-10 DIAGNOSIS — R197 Diarrhea, unspecified: Secondary | ICD-10-CM | POA: Diagnosis present

## 2022-05-10 DIAGNOSIS — K852 Alcohol induced acute pancreatitis without necrosis or infection: Principal | ICD-10-CM

## 2022-05-10 DIAGNOSIS — Z8249 Family history of ischemic heart disease and other diseases of the circulatory system: Secondary | ICD-10-CM

## 2022-05-10 DIAGNOSIS — E8809 Other disorders of plasma-protein metabolism, not elsewhere classified: Secondary | ICD-10-CM | POA: Diagnosis present

## 2022-05-10 DIAGNOSIS — Z833 Family history of diabetes mellitus: Secondary | ICD-10-CM

## 2022-05-10 DIAGNOSIS — E871 Hypo-osmolality and hyponatremia: Secondary | ICD-10-CM | POA: Diagnosis present

## 2022-05-10 DIAGNOSIS — D61818 Other pancytopenia: Secondary | ICD-10-CM | POA: Diagnosis present

## 2022-05-10 DIAGNOSIS — R7989 Other specified abnormal findings of blood chemistry: Secondary | ICD-10-CM | POA: Diagnosis present

## 2022-05-10 DIAGNOSIS — F4310 Post-traumatic stress disorder, unspecified: Secondary | ICD-10-CM | POA: Diagnosis present

## 2022-05-10 DIAGNOSIS — R519 Headache, unspecified: Secondary | ICD-10-CM | POA: Diagnosis present

## 2022-05-10 DIAGNOSIS — E876 Hypokalemia: Secondary | ICD-10-CM | POA: Diagnosis present

## 2022-05-10 DIAGNOSIS — D696 Thrombocytopenia, unspecified: Secondary | ICD-10-CM | POA: Diagnosis present

## 2022-05-10 DIAGNOSIS — F1010 Alcohol abuse, uncomplicated: Secondary | ICD-10-CM | POA: Diagnosis present

## 2022-05-10 DIAGNOSIS — K760 Fatty (change of) liver, not elsewhere classified: Secondary | ICD-10-CM | POA: Diagnosis present

## 2022-05-10 DIAGNOSIS — Z825 Family history of asthma and other chronic lower respiratory diseases: Secondary | ICD-10-CM

## 2022-05-10 HISTORY — DX: Gastro-esophageal reflux disease without esophagitis: K21.9

## 2022-05-10 LAB — COMPREHENSIVE METABOLIC PANEL
ALT: 1859 U/L — ABNORMAL HIGH (ref 0–44)
AST: 4577 U/L — ABNORMAL HIGH (ref 15–41)
Albumin: 4.2 g/dL (ref 3.5–5.0)
Alkaline Phosphatase: 115 U/L (ref 38–126)
Anion gap: 14 (ref 5–15)
BUN: 12 mg/dL (ref 6–20)
CO2: 26 mmol/L (ref 22–32)
Calcium: 8.6 mg/dL — ABNORMAL LOW (ref 8.9–10.3)
Chloride: 96 mmol/L — ABNORMAL LOW (ref 98–111)
Creatinine, Ser: 0.77 mg/dL (ref 0.44–1.00)
GFR, Estimated: >60 mL/min (ref >60)
Glucose, Bld: 126 mg/dL — ABNORMAL HIGH (ref 70–99)
Potassium: 3.8 mmol/L (ref 3.5–5.1)
Sodium: 136 mmol/L (ref 135–145)
Total Bilirubin: 2.3 mg/dL — ABNORMAL HIGH (ref 0.3–1.2)
Total Protein: 7.5 g/dL (ref 6.5–8.1)

## 2022-05-10 LAB — CBC
HCT: 43.7 % (ref 36.0–46.0)
Hemoglobin: 14.7 g/dL (ref 12.0–15.0)
MCH: 31.7 pg (ref 26.0–34.0)
MCHC: 33.6 g/dL (ref 30.0–36.0)
MCV: 94.2 fL (ref 80.0–100.0)
Platelets: 93 10*3/uL — ABNORMAL LOW (ref 150–400)
RBC: 4.64 MIL/uL (ref 3.87–5.11)
RDW: 12.7 % (ref 11.5–15.5)
WBC: 6.2 10*3/uL (ref 4.0–10.5)
nRBC: 0 % (ref 0.0–0.2)

## 2022-05-10 LAB — LIPASE, BLOOD: Lipase: 101 U/L — ABNORMAL HIGH (ref 11–51)

## 2022-05-10 MED ORDER — LACTATED RINGERS IV BOLUS
1000.0000 mL | Freq: Once | INTRAVENOUS | Status: AC
  Administered 2022-05-10: 1000 mL via INTRAVENOUS

## 2022-05-10 MED ORDER — FENTANYL CITRATE PF 50 MCG/ML IJ SOSY
50.0000 ug | PREFILLED_SYRINGE | Freq: Once | INTRAMUSCULAR | Status: AC
  Administered 2022-05-11: 50 ug via INTRAVENOUS
  Filled 2022-05-10: qty 1

## 2022-05-10 MED ORDER — ONDANSETRON HCL 4 MG/2ML IJ SOLN
4.0000 mg | Freq: Once | INTRAMUSCULAR | Status: AC
  Administered 2022-05-10: 4 mg via INTRAVENOUS
  Filled 2022-05-10: qty 2

## 2022-05-10 MED ORDER — ONDANSETRON 4 MG PO TBDP
4.0000 mg | ORAL_TABLET | Freq: Once | ORAL | Status: AC | PRN
  Administered 2022-05-10: 4 mg via ORAL
  Filled 2022-05-10: qty 1

## 2022-05-10 NOTE — ED Triage Notes (Addendum)
Pt to ED from home c/o n/v this week.  Hx of acid reflux and just started back on medicine but has been unable to keep anything down.  Unknown if fevers at home but having cold chills.  Some stomach discomfort from vomiting.  Pt states burning with urination and told she did not have a UTI 2 days ago, denies hematuria or odor.

## 2022-05-10 NOTE — ED Notes (Signed)
US at bedside

## 2022-05-11 ENCOUNTER — Encounter: Payer: Self-pay | Admitting: Family Medicine

## 2022-05-11 ENCOUNTER — Emergency Department: Payer: 59

## 2022-05-11 DIAGNOSIS — F1010 Alcohol abuse, uncomplicated: Secondary | ICD-10-CM | POA: Diagnosis present

## 2022-05-11 DIAGNOSIS — Z825 Family history of asthma and other chronic lower respiratory diseases: Secondary | ICD-10-CM | POA: Diagnosis not present

## 2022-05-11 DIAGNOSIS — K7290 Hepatic failure, unspecified without coma: Secondary | ICD-10-CM | POA: Diagnosis present

## 2022-05-11 DIAGNOSIS — K760 Fatty (change of) liver, not elsewhere classified: Secondary | ICD-10-CM | POA: Diagnosis present

## 2022-05-11 DIAGNOSIS — E876 Hypokalemia: Secondary | ICD-10-CM | POA: Diagnosis present

## 2022-05-11 DIAGNOSIS — K7200 Acute and subacute hepatic failure without coma: Secondary | ICD-10-CM | POA: Diagnosis present

## 2022-05-11 DIAGNOSIS — I10 Essential (primary) hypertension: Secondary | ICD-10-CM | POA: Diagnosis present

## 2022-05-11 DIAGNOSIS — Z8249 Family history of ischemic heart disease and other diseases of the circulatory system: Secondary | ICD-10-CM | POA: Diagnosis not present

## 2022-05-11 DIAGNOSIS — F419 Anxiety disorder, unspecified: Secondary | ICD-10-CM

## 2022-05-11 DIAGNOSIS — D696 Thrombocytopenia, unspecified: Secondary | ICD-10-CM | POA: Diagnosis present

## 2022-05-11 DIAGNOSIS — F32A Depression, unspecified: Secondary | ICD-10-CM | POA: Diagnosis present

## 2022-05-11 DIAGNOSIS — K852 Alcohol induced acute pancreatitis without necrosis or infection: Secondary | ICD-10-CM | POA: Diagnosis not present

## 2022-05-11 DIAGNOSIS — F4310 Post-traumatic stress disorder, unspecified: Secondary | ICD-10-CM | POA: Diagnosis present

## 2022-05-11 DIAGNOSIS — F17210 Nicotine dependence, cigarettes, uncomplicated: Secondary | ICD-10-CM | POA: Diagnosis present

## 2022-05-11 DIAGNOSIS — Z79899 Other long term (current) drug therapy: Secondary | ICD-10-CM | POA: Diagnosis not present

## 2022-05-11 DIAGNOSIS — R7989 Other specified abnormal findings of blood chemistry: Secondary | ICD-10-CM | POA: Diagnosis present

## 2022-05-11 DIAGNOSIS — D61818 Other pancytopenia: Secondary | ICD-10-CM | POA: Diagnosis present

## 2022-05-11 DIAGNOSIS — D7589 Other specified diseases of blood and blood-forming organs: Secondary | ICD-10-CM | POA: Diagnosis not present

## 2022-05-11 DIAGNOSIS — D689 Coagulation defect, unspecified: Secondary | ICD-10-CM | POA: Diagnosis present

## 2022-05-11 DIAGNOSIS — Z888 Allergy status to other drugs, medicaments and biological substances status: Secondary | ICD-10-CM | POA: Diagnosis not present

## 2022-05-11 DIAGNOSIS — B179 Acute viral hepatitis, unspecified: Secondary | ICD-10-CM

## 2022-05-11 DIAGNOSIS — G629 Polyneuropathy, unspecified: Secondary | ICD-10-CM | POA: Diagnosis present

## 2022-05-11 DIAGNOSIS — K219 Gastro-esophageal reflux disease without esophagitis: Secondary | ICD-10-CM | POA: Diagnosis present

## 2022-05-11 DIAGNOSIS — Z833 Family history of diabetes mellitus: Secondary | ICD-10-CM | POA: Diagnosis not present

## 2022-05-11 DIAGNOSIS — Z823 Family history of stroke: Secondary | ICD-10-CM | POA: Diagnosis not present

## 2022-05-11 DIAGNOSIS — E8809 Other disorders of plasma-protein metabolism, not elsewhere classified: Secondary | ICD-10-CM | POA: Diagnosis present

## 2022-05-11 DIAGNOSIS — E871 Hypo-osmolality and hyponatremia: Secondary | ICD-10-CM | POA: Diagnosis present

## 2022-05-11 DIAGNOSIS — K7010 Alcoholic hepatitis without ascites: Secondary | ICD-10-CM | POA: Diagnosis present

## 2022-05-11 HISTORY — DX: Acute viral hepatitis, unspecified: B17.9

## 2022-05-11 LAB — HEPATIC FUNCTION PANEL
ALT: 1722 U/L — ABNORMAL HIGH (ref 0–44)
AST: 4486 U/L — ABNORMAL HIGH (ref 15–41)
Albumin: 3.6 g/dL (ref 3.5–5.0)
Alkaline Phosphatase: 100 U/L (ref 38–126)
Bilirubin, Direct: 1.3 mg/dL — ABNORMAL HIGH (ref 0.0–0.2)
Indirect Bilirubin: 0.9 mg/dL (ref 0.3–0.9)
Total Bilirubin: 2.2 mg/dL — ABNORMAL HIGH (ref 0.3–1.2)
Total Protein: 6.4 g/dL — ABNORMAL LOW (ref 6.5–8.1)

## 2022-05-11 LAB — BASIC METABOLIC PANEL
Anion gap: 10 (ref 5–15)
BUN: 11 mg/dL (ref 6–20)
CO2: 26 mmol/L (ref 22–32)
Calcium: 8.1 mg/dL — ABNORMAL LOW (ref 8.9–10.3)
Chloride: 98 mmol/L (ref 98–111)
Creatinine, Ser: 0.72 mg/dL (ref 0.44–1.00)
GFR, Estimated: >60 mL/min (ref >60)
Glucose, Bld: 94 mg/dL (ref 70–99)
Potassium: 3.4 mmol/L — ABNORMAL LOW (ref 3.5–5.1)
Sodium: 134 mmol/L — ABNORMAL LOW (ref 135–145)

## 2022-05-11 LAB — URINALYSIS, ROUTINE W REFLEX MICROSCOPIC
Bacteria, UA: NONE SEEN
Bilirubin Urine: NEGATIVE
Glucose, UA: NEGATIVE mg/dL
Ketones, ur: 5 mg/dL — AB
Leukocytes,Ua: NEGATIVE
Nitrite: NEGATIVE
Protein, ur: 100 mg/dL — AB
Specific Gravity, Urine: 1.027 (ref 1.005–1.030)
WBC, UA: NONE SEEN WBC/hpf (ref 0–5)
pH: 5 (ref 5.0–8.0)

## 2022-05-11 LAB — HIV ANTIBODY (ROUTINE TESTING W REFLEX): HIV Screen 4th Generation wRfx: NONREACTIVE

## 2022-05-11 LAB — HEPATITIS PANEL, ACUTE
HCV Ab: NONREACTIVE
Hep A IgM: NONREACTIVE
Hep B C IgM: NONREACTIVE
Hepatitis B Surface Ag: NONREACTIVE

## 2022-05-11 LAB — CBC
HCT: 37.4 % (ref 36.0–46.0)
Hemoglobin: 12.6 g/dL (ref 12.0–15.0)
MCH: 32 pg (ref 26.0–34.0)
MCHC: 33.7 g/dL (ref 30.0–36.0)
MCV: 94.9 fL (ref 80.0–100.0)
Platelets: 52 10*3/uL — ABNORMAL LOW (ref 150–400)
RBC: 3.94 MIL/uL (ref 3.87–5.11)
RDW: 12.4 % (ref 11.5–15.5)
WBC: 3.2 10*3/uL — ABNORMAL LOW (ref 4.0–10.5)
nRBC: 0 % (ref 0.0–0.2)

## 2022-05-11 LAB — PROTIME-INR
INR: 2.2 — ABNORMAL HIGH (ref 0.8–1.2)
INR: 1.7 — ABNORMAL HIGH (ref 0.8–1.2)
INR: 1.8 — ABNORMAL HIGH (ref 0.8–1.2)
Prothrombin Time: 23.8 seconds — ABNORMAL HIGH (ref 11.4–15.2)
Prothrombin Time: 20 seconds — ABNORMAL HIGH (ref 11.4–15.2)
Prothrombin Time: 20.4 seconds — ABNORMAL HIGH (ref 11.4–15.2)

## 2022-05-11 LAB — POC URINE PREG, ED: Preg Test, Ur: NEGATIVE

## 2022-05-11 LAB — ACETAMINOPHEN LEVEL: Acetaminophen (Tylenol), Serum: <10 ug/mL — ABNORMAL LOW (ref 10–30)

## 2022-05-11 MED ORDER — SODIUM CHLORIDE 0.9 % IV SOLN: INTRAVENOUS | Status: DC

## 2022-05-11 MED ORDER — KETOROLAC TROMETHAMINE 15 MG/ML IJ SOLN
15.0000 mg | Freq: Four times a day (QID) | INTRAMUSCULAR | Status: DC | PRN
  Administered 2022-05-11: 15 mg via INTRAVENOUS
  Filled 2022-05-11: qty 1

## 2022-05-11 MED ORDER — LORAZEPAM 2 MG/ML IJ SOLN
1.0000 mg | INTRAMUSCULAR | Status: AC | PRN
  Administered 2022-05-11: 2 mg via INTRAVENOUS
  Filled 2022-05-11: qty 1

## 2022-05-11 MED ORDER — CHLORDIAZEPOXIDE HCL 25 MG PO CAPS: 25.0000 mg | ORAL_CAPSULE | Freq: Three times a day (TID) | ORAL | Status: DC

## 2022-05-11 MED ORDER — MAGNESIUM HYDROXIDE 400 MG/5ML PO SUSP: 30.0000 mL | Freq: Every day | ORAL | Status: DC | PRN

## 2022-05-11 MED ORDER — HYDROXYZINE HCL 25 MG PO TABS
50.0000 mg | ORAL_TABLET | Freq: Three times a day (TID) | ORAL | Status: DC | PRN
  Administered 2022-05-11: 50 mg via ORAL
  Filled 2022-05-11: qty 2

## 2022-05-11 MED ORDER — THIAMINE HCL 100 MG/ML IJ SOLN: Freq: Once | INTRAVENOUS | Status: DC

## 2022-05-11 MED ORDER — LORAZEPAM 1 MG PO TABS: 1.0000 mg | ORAL_TABLET | ORAL | Status: AC | PRN

## 2022-05-11 MED ORDER — PANTOPRAZOLE SODIUM 40 MG PO TBEC
40.0000 mg | DELAYED_RELEASE_TABLET | Freq: Every day | ORAL | Status: DC
  Administered 2022-05-11 – 2022-05-15 (×5): 40 mg via ORAL
  Filled 2022-05-11 (×5): qty 1

## 2022-05-11 MED ORDER — POTASSIUM CHLORIDE 20 MEQ PO PACK
40.0000 meq | PACK | Freq: Once | ORAL | Status: DC
Start: 2022-05-11 — End: 2022-05-11

## 2022-05-11 MED ORDER — TRAZODONE HCL 100 MG PO TABS
100.0000 mg | ORAL_TABLET | Freq: Every day | ORAL | Status: DC
  Administered 2022-05-11 – 2022-05-14 (×4): 100 mg via ORAL
  Filled 2022-05-11 (×4): qty 1

## 2022-05-11 MED ORDER — MORPHINE SULFATE (PF) 4 MG/ML IV SOLN
4.0000 mg | Freq: Once | INTRAVENOUS | Status: AC
  Administered 2022-05-11: 4 mg via INTRAVENOUS
  Filled 2022-05-11: qty 1

## 2022-05-11 MED ORDER — TRAZODONE HCL 50 MG PO TABS
25.0000 mg | ORAL_TABLET | Freq: Every evening | ORAL | Status: DC | PRN
  Administered 2022-05-11: 25 mg via ORAL
  Filled 2022-05-11: qty 1

## 2022-05-11 MED ORDER — ADULT MULTIVITAMIN W/MINERALS CH
1.0000 | ORAL_TABLET | Freq: Every day | ORAL | Status: DC
  Administered 2022-05-12 – 2022-05-15 (×4): 1 via ORAL
  Filled 2022-05-11 (×4): qty 1

## 2022-05-11 MED ORDER — LORAZEPAM 2 MG/ML IJ SOLN
1.0000 mg | INTRAMUSCULAR | Status: DC | PRN
  Administered 2022-05-11 (×2): 1 mg via INTRAVENOUS
  Filled 2022-05-11 (×2): qty 1

## 2022-05-11 MED ORDER — FOLIC ACID 1 MG PO TABS
1.0000 mg | ORAL_TABLET | Freq: Every day | ORAL | Status: DC
  Administered 2022-05-11: 1 mg via ORAL
  Filled 2022-05-11: qty 1

## 2022-05-11 MED ORDER — THIAMINE HCL 100 MG PO TABS
100.0000 mg | ORAL_TABLET | Freq: Every day | ORAL | Status: DC
  Administered 2022-05-11: 100 mg via ORAL
  Filled 2022-05-11: qty 1

## 2022-05-11 MED ORDER — BUSPIRONE HCL 5 MG PO TABS: 20.0000 mg | ORAL_TABLET | Freq: Every day | ORAL | Status: DC

## 2022-05-11 MED ORDER — LACTATED RINGERS IV SOLN: INTRAVENOUS | Status: DC

## 2022-05-11 MED ORDER — ACETAMINOPHEN 325 MG PO TABS: 650.0000 mg | ORAL_TABLET | Freq: Four times a day (QID) | ORAL | Status: DC | PRN

## 2022-05-11 MED ORDER — HYDROMORPHONE HCL 1 MG/ML IJ SOLN
1.0000 mg | INTRAMUSCULAR | Status: DC | PRN
  Administered 2022-05-11 – 2022-05-12 (×4): 1 mg via INTRAVENOUS
  Filled 2022-05-11 (×4): qty 1

## 2022-05-11 MED ORDER — THIAMINE HCL 100 MG/ML IJ SOLN: 100.0000 mg | Freq: Every day | INTRAMUSCULAR | Status: DC

## 2022-05-11 MED ORDER — POTASSIUM CHLORIDE 10 MEQ/100ML IV SOLN
10.0000 meq | INTRAVENOUS | Status: AC
  Administered 2022-05-11 (×4): 10 meq via INTRAVENOUS
  Filled 2022-05-11 (×3): qty 100

## 2022-05-11 MED ORDER — ONDANSETRON HCL 4 MG/2ML IJ SOLN
4.0000 mg | Freq: Four times a day (QID) | INTRAMUSCULAR | Status: DC | PRN
Start: 2022-05-11 — End: 2022-05-15
  Administered 2022-05-11 (×3): 4 mg via INTRAVENOUS
  Filled 2022-05-11 (×3): qty 2

## 2022-05-11 MED ORDER — ENOXAPARIN SODIUM 40 MG/0.4ML IJ SOSY: 40.0000 mg | PREFILLED_SYRINGE | INTRAMUSCULAR | Status: DC

## 2022-05-11 MED ORDER — CITALOPRAM HYDROBROMIDE 20 MG PO TABS
40.0000 mg | ORAL_TABLET | Freq: Every day | ORAL | Status: DC
Start: 2022-05-11 — End: 2022-05-15
  Administered 2022-05-11 – 2022-05-15 (×5): 40 mg via ORAL
  Filled 2022-05-11 (×5): qty 2

## 2022-05-11 MED ORDER — THIAMINE HCL 100 MG PO TABS
100.0000 mg | ORAL_TABLET | Freq: Every day | ORAL | Status: DC
  Administered 2022-05-12 – 2022-05-15 (×4): 100 mg via ORAL
  Filled 2022-05-11 (×4): qty 1

## 2022-05-11 MED ORDER — IOHEXOL 300 MG/ML  SOLN
100.0000 mL | Freq: Once | INTRAMUSCULAR | Status: AC | PRN
  Administered 2022-05-11: 100 mL via INTRAVENOUS

## 2022-05-11 MED ORDER — ACETAMINOPHEN 650 MG RE SUPP
650.0000 mg | Freq: Four times a day (QID) | RECTAL | Status: DC | PRN
Start: 2022-05-11 — End: 2022-05-15

## 2022-05-11 MED ORDER — ONDANSETRON HCL 4 MG PO TABS: 4.0000 mg | ORAL_TABLET | Freq: Four times a day (QID) | ORAL | Status: DC | PRN

## 2022-05-11 MED ORDER — ALBUTEROL SULFATE HFA 108 (90 BASE) MCG/ACT IN AERS: 2.0000 | INHALATION_SPRAY | Freq: Four times a day (QID) | RESPIRATORY_TRACT | Status: DC | PRN

## 2022-05-11 MED ORDER — ADULT MULTIVITAMIN W/MINERALS CH
1.0000 | ORAL_TABLET | Freq: Every day | ORAL | Status: DC
  Administered 2022-05-11: 1 via ORAL
  Filled 2022-05-11: qty 1

## 2022-05-11 MED ORDER — ALBUTEROL SULFATE (2.5 MG/3ML) 0.083% IN NEBU
2.5000 mg | INHALATION_SOLUTION | Freq: Four times a day (QID) | RESPIRATORY_TRACT | Status: DC | PRN
Start: 2022-05-11 — End: 2022-05-15

## 2022-05-11 MED ORDER — METOPROLOL TARTRATE 50 MG PO TABS: 50.0000 mg | ORAL_TABLET | Freq: Two times a day (BID) | ORAL | Status: DC

## 2022-05-11 MED ORDER — FOLIC ACID 1 MG PO TABS
1.0000 mg | ORAL_TABLET | Freq: Every day | ORAL | Status: DC
  Administered 2022-05-12 – 2022-05-15 (×4): 1 mg via ORAL
  Filled 2022-05-11 (×4): qty 1

## 2022-05-11 NOTE — Assessment & Plan Note (Addendum)
Initially AST 4577, ALT 1859, 2.3. alcoholic hepatitis.  Since transaminases greater than 8 times upper limit of normal, GI feels this is less likely to be due to alcoholic hepatitis.  RUQ U/S showed hepatic steatosis. CT abd/pelvis non-acute. Acute hepatitis panel negative. Pt's hepatic synthetic function is compromised as evidenced by elevated INR.  Also having myelosuppression. Labs: negative anti-smooth muscle Ab, negative anti-mitochondrial Ab MRI liver negative for signs of hemochromatosis or other hepatobiliary disease  5/23: AST/ALT continue to improve  --GI consulted, signed off -- Follow-up ID recommendations --Repeat CMP's, PT/INR in 1 week --Follow pending EBV, CMV IgM, ANA --Follow up hemochromatosis genetic screen

## 2022-05-11 NOTE — Plan of Care (Signed)
  Problem: Clinical Measurements: Goal: Cardiovascular complication will be avoided Outcome: Progressing   Problem: Activity: Goal: Risk for activity intolerance will decrease Outcome: Progressing   Problem: Coping: Goal: Level of anxiety will decrease Outcome: Progressing   Problem: Elimination: Goal: Will not experience complications related to urinary retention Outcome: Progressing   Problem: Safety: Goal: Ability to remain free from injury will improve Outcome: Progressing

## 2022-05-11 NOTE — Progress Notes (Addendum)
Same day as admission rounding note.  51 year old female past medical history of alcohol abuse, GERD, hypertension, PTSD, anxiety, depression who was admitted after midnight with acute liver failure felt secondary to alcoholic hepatitis.  Seen on rounds today.  Patient continues to have abdominal pain mostly on the lower left wrapping around to her back.  Also has a significant headache.  She otherwise has no acute complaints at this time.  No acute events reported.  Case discussed with gastroenterology.  Monitoring coagulopathies closely in case of need to transfer to tertiary center with hepatology coverage.  Fortunately INR improved from 2.2 down to 1.7 today. Continue to monitor closely.   Exam Patient awake alert no acute distress HEENT: Hearing grossly normal, moist mucous membranes, no scleral icterus Respiratory: Lungs clear bilaterally Cardiovascular: Regular rate and rhythm, no peripheral edema, no murmurs heard GI: Abdomen soft nondistended, mildly tender on palpation without rebound Neuro: AOx3, cranial nerves grossly intact, grossly nonfocal exam, normal speech Extremities: Moves all, no peripheral edema, no cyanosis or gross deformities noted, no tremor of outstretched hands Skin: No jaundice, skin dry intact no rashes seen on visualized skin Psychiatric: Normal mood, congruent affect   Please refer to H&P and gastroenterology consult note for detailed assessment and plans I have reviewed these and agree with any changes or additions as outlined below.   Assessment/plan:  Headache -IV Toradol as needed until tolerating p.o. Hypokalemia -replace potassium, monitor BMP   Family communication: mother updated by phone this evening.   No charge

## 2022-05-11 NOTE — Assessment & Plan Note (Addendum)
Continue Lexapro

## 2022-05-11 NOTE — Assessment & Plan Note (Addendum)
Continue PPI ?

## 2022-05-11 NOTE — ED Notes (Signed)
Patient transported to CT 

## 2022-05-11 NOTE — ED Provider Notes (Signed)
[facility] Provider Note    Event Date/Time   First MD Initiated Contact with Patient 05/10/22 2258     (approximate)   History   Nausea and Vomiting   HPI  Anna Lucas is a 51 y.o. female with a history of alcohol abuse, GERD, anxiety, depression, hypertension who presents for evaluation of abdominal pain.  Patient reports 2 to 3 days of diffuse cramping sharp abdominal pain associated with several daily episodes of nonbloody nonbilious emesis and watery diarrhea.  Patient reports that she vomits every time she tries to eat or drink something.  No fever or chills, no dysuria or hematuria.  Patient continues to drink.       Past Medical History:  Diagnosis Date   Acid reflux    Alcohol abuse    Anxiety    Depression    Hypertension    PTSD (post-traumatic stress disorder)     Past Surgical History:  Procedure Laterality Date   CESAREAN SECTION       Physical Exam   Triage Vital Signs: ED Triage Vitals  Enc Vitals Group     BP 05/10/22 2050 121/79     Pulse Rate 05/10/22 2050 73     Resp 05/10/22 2050 16     Temp 05/10/22 2050 98.3 F (36.8 C)     Temp Source 05/10/22 2050 Oral     SpO2 05/10/22 2050 97 %     Weight 05/10/22 2046 149 lb (67.6 kg)     Height 05/10/22 2046 5' 5"  (1.651 m)     Head Circumference --      Peak Flow --      Pain Score 05/10/22 2046 0     Pain Loc --      Pain Edu? --      Excl. in [place]? --     Most recent vital signs: Vitals:   05/11/22 0038 05/11/22 0050  BP: 117/75 117/75  Pulse: 65 65  Resp:  16  Temp:    SpO2: 95% 95%     Constitutional: Alert and oriented. No distress HEENT:      Head: Normocephalic and atraumatic.         Eyes: Conjunctivae are normal. Sclera is non-icteric.       Mouth/Throat: Mucous membranes are moist.       Neck: Supple with no signs of meningismus. Cardiovascular: Regular rate and rhythm. No murmurs, gallops, or rubs. 2+ symmetrical distal pulses are present in  all extremities.  Respiratory: Normal respiratory effort. Lungs are clear to auscultation bilaterally.  Gastrointestinal: Soft, diffusely tender to palpation, and non distended with positive bowel sounds. No rebound or guarding. Genitourinary: No CVA tenderness. Musculoskeletal:  No edema, cyanosis, or erythema of extremities. Neurologic: Normal speech and language. Face is symmetric. Moving all extremities. No gross focal neurologic deficits are appreciated. Skin: Skin is warm, dry and intact. No rash noted. Psychiatric: Mood and affect are normal. Speech and behavior are normal.  ED Results / Procedures / Treatments   Labs (all labs ordered are listed, but only abnormal results are displayed) Labs Reviewed  LIPASE, BLOOD - Abnormal; Notable for the following components:      Result Value   Lipase 101 (*)    All other components within normal limits  COMPREHENSIVE METABOLIC PANEL - Abnormal; Notable for the following components:   Chloride 96 (*)    Glucose, Bld 126 (*)    Calcium 8.6 (*)    AST  4,577 (*)    ALT 1,859 (*)    Total Bilirubin 2.3 (*)    All other components within normal limits  CBC - Abnormal; Notable for the following components:   Platelets 93 (*)    All other components within normal limits  URINALYSIS, ROUTINE W REFLEX MICROSCOPIC - Abnormal; Notable for the following components:   Color, Urine AMBER (*)    APPearance HAZY (*)    Hgb urine dipstick SMALL (*)    Ketones, ur 5 (*)    Protein, ur 100 (*)    All other components within normal limits  ACETAMINOPHEN LEVEL - Abnormal; Notable for the following components:   Acetaminophen (Tylenol), Serum <10 (*)    All other components within normal limits  PROTIME-INR  POC URINE PREG, ED     EKG  none   RADIOLOGY I, Rudene Re, attending MD, have personally viewed and interpreted the images obtained during this visit as below:  Ultrasound negative for  cholecystitis   ___________________________________________________ Interpretation by Radiologist:  CT ABDOMEN PELVIS W CONTRAST  Result Date: 05/11/2022 CLINICAL DATA:  Abdominal pain, nausea/vomiting EXAM: CT ABDOMEN AND PELVIS WITH CONTRAST TECHNIQUE: Multidetector CT imaging of the abdomen and pelvis was performed using the standard protocol following bolus administration of intravenous contrast. RADIATION DOSE REDUCTION: This exam was performed according to the departmental dose-optimization program which includes automated exposure control, adjustment of the mA and/or kV according to patient size and/or use of iterative reconstruction technique. CONTRAST:  150m OMNIPAQUE IOHEXOL 300 MG/ML  SOLN COMPARISON:  Right upper quadrant abdominal ultrasound dated 05/10/2022. CT abdomen/pelvis dated 08/17/2016. FINDINGS: Lower chest: Lung bases are clear. Hepatobiliary: Liver is within normal limits. Gallbladder is unremarkable. No intrahepatic or extrahepatic ductal dilatation. Pancreas: Within normal limits. Spleen: Within normal limits. Adrenals/Urinary Tract: Adrenal glands are within normal limits. Kidneys are within normal limits.  No hydronephrosis. Bladder is thick-walled although underdistended. Stomach/Bowel: Stomach is within normal limits. No evidence of bowel obstruction. Normal appendix (series 2/image 62). No colonic wall thickening or inflammatory changes. Vascular/Lymphatic: No evidence of abdominal aortic aneurysm. Atherosclerotic calcifications of the abdominal aorta and branch vessels. No suspicious abdominopelvic lymphadenopathy. Reproductive: Uterus is within normal limits. Bilateral ovaries are within normal limits. Other: No abdominopelvic ascites. Musculoskeletal: Visualized osseous structures are within normal limits. IMPRESSION: No CT findings to account for the patient's abdominal pain. Electronically Signed   By: SJulian HyM.D.   On: 05/11/2022 01:51   [place] LIMITED  RUQ (LIVER/GB)  Result Date: 05/10/2022 CLINICAL DATA:  Abdominal pain, elevated LFTs EXAM: ULTRASOUND ABDOMEN LIMITED RIGHT UPPER QUADRANT COMPARISON:  None Available. FINDINGS: Gallbladder: No gallstones or wall thickening visualized. No sonographic Murphy sign noted by sonographer. Common bile duct: Diameter: 4 mm Liver: Hyperechoic hepatic parenchyma, suggesting hepatic steatosis. No focal hepatic lesion is seen. Portal vein is patent on color Doppler imaging with normal direction of blood flow towards the liver. Other: None. IMPRESSION: Hepatic steatosis. Electronically Signed   By: SJulian HyM.D.   On: 05/10/2022 23:55       PROCEDURES:  Critical Care performed: No  Procedures    IMPRESSION / MDM / ASSESSMENT AND PLAN / ED COURSE  I reviewed the triage vital signs and the nursing notes.  51y.o. female with a history of alcohol abuse, GERD, anxiety, depression, hypertension who presents for evaluation of abdominal pain, nausea, vomiting, diarrhea x2 to 3 days.  On exam, patient is in no significant distress, vitals are within normal limits, abdomen  is nondistended with diffuse tenderness, no rebound or guarding.  Ddx: Alcoholic gastritis versus peptic ulcer disease versus pancreatitis versus gallbladder pathology versus viral gastroenteritis versus diverticulitis versus pyelonephritis versus UTI   Plan: CBC, CMP, lipase, pregnancy test.  IV fentanyl, IV fluids, IV Zofran   MEDICATIONS GIVEN IN ED: Medications  lactated ringers infusion (has no administration in time range)  morphine (PF) 4 MG/ML injection 4 mg (has no administration in time range)  ondansetron (ZOFRAN-ODT) disintegrating tablet 4 mg (4 mg Oral Given 05/10/22 2052)  lactated ringers bolus 1,000 mL (1,000 mLs Intravenous New Bag/Given 05/10/22 2330)  ondansetron (ZOFRAN) injection 4 mg (4 mg Intravenous Given 05/10/22 2329)  fentaNYL (SUBLIMAZE) injection 50 mcg (50 mcg Intravenous Given 05/11/22 0034)   iohexol (OMNIPAQUE) 300 MG/ML solution 100 mL (100 mLs Intravenous Contrast Given 05/11/22 0135)     ED COURSE: Labs showing no leukocytosis, no anemia, stable thrombocytopenia in the setting of most 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  Patient's LFTs are extremely elevated, AST is 4577 and ALT is 1859 with a T. bili of 2.3, normal alk phos and lipase of 101.  Right upper quadrant ultrasound showing no signs of choledocholithiasis or acute cholecystitis.  CT showed no acute abnormalities.  Tylenol level was added on and negative.  Most likely the hall induced pancreatitis and hepatitis.  Patient continues have significant pain and nausea.  We will continue LR maintenance fluids, give a dose of morphine and consult hospitalist.  After discussion with the hospitalist patient was accepted to their service   Consults: Hospitalist   EMR reviewed Including last visit with her GI doctor from March 2022 for abnormal LFTs    FINAL CLINICAL IMPRESSION(S) / ED DIAGNOSES   Final diagnoses:  Alcohol-induced acute pancreatitis without infection or necrosis  Hepatitis, alcoholic, acute     Rx / DC Orders   ED Discharge Orders     None        Note:  This document was prepared using Dragon voice recognition software and may include unintentional dictation errors.   Please note:  Patient was evaluated in Emergency Department today for the symptoms described in the history of present illness. Patient was evaluated in the context of the global COVID-19 pandemic, which necessitated consideration that the patient might be at risk for infection with the SARS-CoV-2 virus that causes COVID-19. Institutional protocols and algorithms that pertain to the evaluation of patients at risk for COVID-19 are in a state of rapid change based on information released by regulatory bodies including the CDC and federal and state organizations. These policies and algorithms were followed during the  patient's care in the ED.  Some ED evaluations and interventions may be delayed as a result of limited staffing during the pandemic.       Rudene Re, MD 05/11/22 [phone]

## 2022-05-11 NOTE — ED Notes (Signed)
Pt returned from CT °

## 2022-05-11 NOTE — Assessment & Plan Note (Signed)
Continue Neurontin. 

## 2022-05-11 NOTE — Consult Note (Signed)
Midge Minium, MD [facility]  [address] [place], [place] [address] Phone: [phone] Fax : [phone]  Consultation  Referring Provider:     Dr. Arville Care Primary Care Physician:  [facility] Primary Gastroenterologist:  Dr. Tobi Bastos         Reason for Consultation:     Abnormal liver enzymes  Date of Admission:  05/10/2022 Date of Consultation:  05/11/2022         HPI:   Anna Lucas is a 51 y.o. female who comes in with a history of alcohol abuse and had seen Dr. Tobi Bastos for abnormal liver enzymes a year ago.  At that time the patient was recommended to undergo an EGD and colonoscopy and stop drinking.  The patient never followed up and now comes to the hospital with abnormal liver enzymes and abdominal pain.  The patient states that she ran out of her omeprazole.  Her abdominal pain is in the epigastric area and diffuse throughout the entire abdomen.  The patient's liver enzymes on admission were significantly elevated with the patient's AST 4577 that went down to 4486 this morning.  The patient's ALT was 1859 that went down to 1722.  The patient's INR was also elevated 2.2 and the bilirubin was elevated 2.3.  The patient's bilirubin 5 years ago was 3.1.  The patient's repeat INR today was down to 1.7.  The patient continues to have abdominal pain but states that her abdominal pain is better.  Despite having LFTs clearly over 8 times the upper limit of normal the patient was diagnosed with alcoholic hepatitis by both the ED doctor and the hospitalist.  The patient denies any new medications or episodes of hypotension.  The patient also states that she was only taking a few Tylenol a day for pain and she also reports that she was not doing it every day.  The patient denies drinking every day despite a long history of alcohol abuse.  Past Medical History:  Diagnosis Date   Acid reflux    Alcohol abuse    Anxiety    Depression    Hypertension    PTSD (post-traumatic  stress disorder)     Past Surgical History:  Procedure Laterality Date   CESAREAN SECTION      Prior to Admission medications   Medication Sig Start Date End Date Taking? Authorizing Provider  albuterol (PROVENTIL HFA;VENTOLIN HFA) 108 (90 Base) MCG/ACT inhaler Inhale 2 puffs into the lungs every 6 (six) hours as needed for wheezing or shortness of breath. 02/02/16  Yes Phineas Semen, MD  citalopram (CELEXA) 40 MG tablet Take 40 mg by mouth daily. 12/05/20  Yes [provider]  gabapentin (NEURONTIN) 300 MG capsule Take 300 mg by mouth 3 (three) times daily. 05/08/22  Yes [provider]  hydrOXYzine (ATARAX/VISTARIL) 50 MG tablet Take 50 mg by mouth 3 (three) times daily as needed. 02/05/21  Yes [provider]  omeprazole (PRILOSEC) 40 MG capsule Take 40 mg by mouth daily.   Yes [provider]  traZODone (DESYREL) 100 MG tablet Take 100 mg by mouth at bedtime. 04/18/22  Yes [provider]    Family History  Problem Relation Age of Onset   CAD Paternal Grandfather    Stroke Paternal Grandfather    Diabetes Mellitus II Maternal Grandmother    Emphysema Maternal Grandmother      Social History   Tobacco Use   Smoking status: Every Day    Packs/day:  1.00    Types: Cigarettes   Smokeless tobacco: Never  Substance Use Topics   Alcohol use: Yes    Comment: twice a week   Drug use: Never    Allergies as of 05/10/2022 - Review Complete 05/10/2022  Allergen Reaction Noted   Prednisone  12/19/2015    Review of Systems:    All systems reviewed and negative except where noted in HPI.   Physical Exam:  Vital signs in last 24 hours: Temp:  [98.3 F (36.8 C)-100 F (37.8 C)] 100 F (37.8 C) (05/19 1128) Pulse Rate:  [65-95] 95 (05/19 1128) Resp:  [16-18] 18 (05/19 1128) BP: (117-152)/(65-87) 147/83 (05/19 1128) SpO2:  [92 %-98 %] 92 % (05/19 1128) Weight:  [67.6 kg] 67.6 kg (05/18 2046) Last BM Date : 05/10/22 General:    Pleasant, cooperative in NAD Head:  Normocephalic and atraumatic. Eyes:   No icterus.   Conjunctiva pink. PERRLA. Ears:  Normal auditory acuity. Neck:  Supple; no masses or thyroidomegaly Lungs: Respirations even and unlabored. Lungs clear to auscultation bilaterally.   No wheezes, crackles, or rhonchi.  Heart:  Regular rate and rhythm;  Without murmur, clicks, rubs or gallops Abdomen:  Soft, nondistended, nontender. Normal bowel sounds. No appreciable masses or hepatomegaly.  No rebound or guarding.  Rectal:  Not performed. Msk:  Symmetrical without gross deformities.   Extremities:  Without edema, cyanosis or clubbing. Neurologic:  Alert and oriented x3;  grossly normal neurologically. Skin:  Intact without significant lesions or rashes. Cervical Nodes:  No significant cervical adenopathy. Psych:  Alert and cooperative. Normal affect.  LAB RESULTS: Recent Labs    05/10/22 2047 05/11/22 0346  WBC 6.2 3.2*  HGB 14.7 12.6  HCT 43.7 37.4  PLT 93* 52*   BMET Recent Labs    05/10/22 2047 05/11/22 0346  NA 136 134*  K 3.8 3.4*  CL 96* 98  CO2 26 26  GLUCOSE 126* 94  BUN 12 11  CREATININE 0.77 0.72  CALCIUM 8.6* 8.1*   LFT Recent Labs    05/11/22 0346  PROT 6.4*  ALBUMIN 3.6  AST 4,486*  ALT 1,722*  ALKPHOS 100  BILITOT 2.2*  BILIDIR 1.3*  IBILI 0.9   PT/INR Recent Labs    05/11/22 0223 05/11/22 1200  LABPROT 23.8* 20.0*  INR 2.2* 1.7*    STUDIES: CT ABDOMEN PELVIS W CONTRAST  Result Date: 05/11/2022 CLINICAL DATA:  Abdominal pain, nausea/vomiting EXAM: CT ABDOMEN AND PELVIS WITH CONTRAST TECHNIQUE: Multidetector CT imaging of the abdomen and pelvis was performed using the standard protocol following bolus administration of intravenous contrast. RADIATION DOSE REDUCTION: This exam was performed according to the departmental dose-optimization program which includes automated exposure control, adjustment of the mA and/or kV according to patient size and/or use  of iterative reconstruction technique. CONTRAST:  100mL OMNIPAQUE IOHEXOL 300 MG/ML  SOLN COMPARISON:  Right upper quadrant abdominal ultrasound dated 05/10/2022. CT abdomen/pelvis dated 08/17/2016. FINDINGS: Lower chest: Lung bases are clear. Hepatobiliary: Liver is within normal limits. Gallbladder is unremarkable. No intrahepatic or extrahepatic ductal dilatation. Pancreas: Within normal limits. Spleen: Within normal limits. Adrenals/Urinary Tract: Adrenal glands are within normal limits. Kidneys are within normal limits.  No hydronephrosis. Bladder is thick-walled although underdistended. Stomach/Bowel: Stomach is within normal limits. No evidence of bowel obstruction. Normal appendix (series 2/image 62). No colonic wall thickening or inflammatory changes. Vascular/Lymphatic: No evidence of abdominal aortic aneurysm. Atherosclerotic calcifications of the abdominal aorta and branch vessels. No suspicious abdominopelvic lymphadenopathy. Reproductive: Uterus is  within normal limits. Bilateral ovaries are within normal limits. Other: No abdominopelvic ascites. Musculoskeletal: Visualized osseous structures are within normal limits. IMPRESSION: No CT findings to account for the patient's abdominal pain. Electronically Signed   By: Charline Bills M.D.   On: 05/11/2022 01:51   US ABDOMEN LIMITED RUQ (LIVER/GB)  Result Date: 05/10/2022 CLINICAL DATA:  Abdominal pain, elevated LFTs EXAM: ULTRASOUND ABDOMEN LIMITED RIGHT UPPER QUADRANT COMPARISON:  None Available. FINDINGS: Gallbladder: No gallstones or wall thickening visualized. No sonographic Murphy sign noted by sonographer. Common bile duct: Diameter: 4 mm Liver: Hyperechoic hepatic parenchyma, suggesting hepatic steatosis. No focal hepatic lesion is seen. Portal vein is patent on color Doppler imaging with normal direction of blood flow towards the liver. Other: None. IMPRESSION: Hepatic steatosis. Electronically Signed   By: Charline Bills M.D.   On:  05/10/2022 23:55      Impression / Plan:   Assessment: Principal Problem:   Acute hepatitis Active Problems:   Anxiety and depression   GERD (gastroesophageal reflux disease)   Peripheral neuropathy   Alcohol abuse   Thrombocytopenia (HCC)   Anna Lucas is a 51 y.o. y/o female with abnormal liver enzymes over 4000.  Liver enzymes in the thousands are typically caused by ischemia (shock liver), acute viral infection or medications.  Alcoholic hepatitis is possible when the liver enzymes are less than 8 times the upper limit of normal wall the others mentioned above are usually with liver enzymes 50 times the upper limit of normal.  See [email or web address]-to-date.com.  The patient's synthetic function is compromised based on the patient's increased INR while not on anticoagulation.  This has been better since last night on recheck today.  This may indicate a turnaround of the patient's acute hepatocellular injury.  The patient's acute hepatitis panel is pending.  Plan:  Plan for this patient would be supportive care while the liver enzymes are going down.  If the synthetic function should deteriorate with an increasing INR, which should be checked every day, then transferred to a tertiary care center for evaluation for possible liver transplant would be the appropriate step.  As long as the patient's INR is improving I would continue to monitor the patient.  I will send tests for other possible causes of her acute hepatitis.  Thank you for involving me in the care of this patient.      LOS: 0 days   Midge Minium, MD, [facility] 05/11/2022, 1:45 PM,  Pager [phone] 7am-5pm  Check AMION for 5pm -7am coverage and on weekends   Note: This dictation was prepared with Dragon dictation along with smaller phrase technology. Any transcriptional errors that result from this process are unintentional.

## 2022-05-11 NOTE — Assessment & Plan Note (Addendum)
No significant withdrawal occurred. Monitored on CIWA protocol.

## 2022-05-11 NOTE — ED Notes (Signed)
Provided pt with ice chips per MD approval.

## 2022-05-11 NOTE — H&P (Addendum)
[place]   PATIENT NAME: Anna Lucas    MR#:  [id number]  DATE OF BIRTH:  1971/09/21  DATE OF ADMISSION:  05/10/2022  PRIMARY CARE PHYSICIAN: [employer]   Patient is coming from: Home  REQUESTING/REFERRING PHYSICIAN: Nita Sickle, MD  CHIEF COMPLAINT:   Chief Complaint  Patient presents with   Nausea   Vomiting    HISTORY OF PRESENT ILLNESS:  MARINDA TYER is a 51 y.o. Caucasian female with medical history significant for alcohol abuse, GERD, hypertension, PTSD, anxiety and depression, who presented to the ER with acute onset of abdominal pain mainly in the epigastric and right upper quadrant with associated nausea and vomiting over the last 3 days as well as watery diarrhea.  Pain has been crampy and occasionally sharp.  No bilious vomitus or hematemesis.  No melena or bright red bleeding per rectum.  She stated that she recently ate Dione Plover.  She denied any recent blood transfusions no new medications or herbal supplements.  No chest pain or dyspnea.  She admits to chills without measured fever.  She has a headache without dizziness or blurred vision.  No dysuria, oliguria or hematuria or flank pain.  She usually drinks a pint of alcohol 3 times a week.  Her last alcoholic drink was 2 days ago.  ED Course: When she came to the ER vital signs were within normal.  Labs revealed a chloride of 96 and blood glucose of 126 with calcium of 8.6.  Lipase was 118 AST was significant elevated at 4577 with ALT of 1859 and total bili 2.3.  CBC showed thrombocytopenia  of 93.  Urine pregnancy test was negative.  UA showed 113 with 5 ketones.  INR was 2.2 with PTT of 23.8.  Urine pregnancy test was negative.  Imaging: Abdominal pelvic CT scan revealed no acute abnormalities.  Right upper quadrant ultrasound showed hepatic steatosis.  The patient was given 4 mg of IV Zofran twice, 50 mcg of IV fentanyl, 4 mg of IV morphine sulfate, 1 L bolus of IV Theragran  followed by 125 mill per hour.  She will be admitted to a medical bed for further evaluation and management. PAST MEDICAL HISTORY:   Past Medical History:  Diagnosis Date   Acid reflux    Alcohol abuse    Anxiety    Depression    Hypertension    PTSD (post-traumatic stress disorder)     PAST SURGICAL HISTORY:   Past Surgical History:  Procedure Laterality Date   CESAREAN SECTION      SOCIAL HISTORY:   Social History   Tobacco Use   Smoking status: Every Day    Packs/day: 1.00    Types: Cigarettes   Smokeless tobacco: Never  Substance Use Topics   Alcohol use: Yes    Comment: twice a week    FAMILY HISTORY:   Family History  Problem Relation Age of Onset   CAD Paternal Grandfather    Stroke Paternal Grandfather    Diabetes Mellitus II Maternal Grandmother    Emphysema Maternal Grandmother     DRUG ALLERGIES:   Allergies  Allergen Reactions   Prednisone     Feet swelling     REVIEW OF SYSTEMS:   ROS As per history of present illness. All pertinent systems were reviewed above. Constitutional, HEENT, cardiovascular, respiratory, GI, GU, musculoskeletal, neuro, psychiatric, endocrine, integumentary and hematologic systems were reviewed and are otherwise negative/unremarkable except for positive findings mentioned above  in the HPI.   MEDICATIONS AT HOME:   Prior to Admission medications   Medication Sig Start Date End Date Taking? Authorizing Provider  albuterol (PROVENTIL HFA;VENTOLIN HFA) 108 (90 Base) MCG/ACT inhaler Inhale 2 puffs into the lungs every 6 (six) hours as needed for wheezing or shortness of breath. 02/02/16   Phineas Semen, MD  busPIRone (BUSPAR) 10 MG tablet Take 20 mg by mouth at bedtime.    [provider]  citalopram (CELEXA) 40 MG tablet Take 40 mg by mouth once. 12/05/20   [provider]  FLUoxetine (PROZAC) 20 MG capsule Take 1 capsule (20 mg total) by mouth daily. 08/19/16   Shaune Pollack, MD  hydrOXYzine  (ATARAX/VISTARIL) 50 MG tablet Take 50 mg by mouth 3 (three) times daily as needed. 02/05/21   [provider]  metoprolol (LOPRESSOR) 50 MG tablet Take 1 tablet (50 mg total) by mouth 2 (two) times daily. 12/23/15   Milagros Loll, MD  omeprazole (PRILOSEC) 40 MG capsule Take 40 mg by mouth daily.    [provider]  ondansetron (ZOFRAN) 4 MG tablet Take 1 tablet (4 mg total) by mouth every 6 (six) hours as needed for nausea. Patient not taking: No sig reported 12/23/15   Milagros Loll, MD  permethrin (ELIMITE) 5 % cream Apply topically once. Patient not taking: No sig reported 12/23/15   Milagros Loll, MD  traZODone (DESYREL) 50 MG tablet Take 2 tablets (100 mg total) by mouth at bedtime. 08/19/16   Shaune Pollack, MD      VITAL SIGNS:  Blood pressure 137/65, pulse 70, temperature 98.3 F (36.8 C), temperature source Oral, resp. rate 18, height  (1.651 m), weight 67.6 kg, last menstrual period 07/24/2016, SpO2 98 %.  PHYSICAL EXAMINATION:  Physical Exam  GENERAL:  51 y.o.-year-old Caucasian female in 100 patient lying in the bed with mild distress from headache.   EYES: Pupils equal, round, reactive to light and accommodation. No scleral icterus. Extraocular muscles intact.  HEENT: Head atraumatic, normocephalic. Oropharynx and nasopharynx clear.  NECK:  Supple, no jugular venous distention. No thyroid enlargement, no tenderness.  LUNGS: Normal breath sounds bilaterally, no wheezing, rales,rhonchi or crepitation. No use of accessory muscles of respiration.  CARDIOVASCULAR: Regular rate and rhythm, S1, S2 normal. No murmurs, rubs, or gallops.  ABDOMEN: Soft, nondistended with epigastric and right upper quadrant tenderness without about those guarding rigidity.  Bowel sounds present. No organomegaly or mass.  EXTREMITIES: No pedal edema, cyanosis, or clubbing.  NEUROLOGIC: Cranial nerves II through XII are intact. Muscle strength 5/5 in all extremities. Sensation intact.  Gait not checked.  PSYCHIATRIC: The patient is alert and oriented x 3.  Normal affect and good eye contact. SKIN: No obvious rash, lesion, or ulcer.   LABORATORY PANEL:   CBC Recent Labs  Lab 05/11/22 0346  WBC 3.2*  HGB 12.6  HCT 37.4  PLT 52*   ------------------------------------------------------------------------------------------------------------------  Chemistries  Recent Labs  Lab 05/10/22 2047  NA 136  K 3.8  CL 96*  CO2 26  GLUCOSE 126*  BUN 12  CREATININE 0.77  CALCIUM 8.6*  AST 4,577*  ALT 1,859*  ALKPHOS 115  BILITOT 2.3*   ------------------------------------------------------------------------------------------------------------------  Cardiac Enzymes No results for input(s): TROPONINI in the last 168 hours. ------------------------------------------------------------------------------------------------------------------  RADIOLOGY:  CT ABDOMEN PELVIS W CONTRAST  Result Date: 05/11/2022 CLINICAL DATA:  Abdominal pain, nausea/vomiting EXAM: CT ABDOMEN AND PELVIS WITH CONTRAST TECHNIQUE: Multidetector CT imaging of the abdomen and pelvis was performed using the  standard protocol following bolus administration of intravenous contrast. RADIATION DOSE REDUCTION: This exam was performed according to the departmental dose-optimization program which includes automated exposure control, adjustment of the mA and/or kV according to patient size and/or use of iterative reconstruction technique. CONTRAST:  OMNIPAQUE IOHEXOL 300 MG/ML  SOLN COMPARISON:  Right upper quadrant abdominal ultrasound dated 05/10/2022. CT abdomen/pelvis dated 08/17/2016. FINDINGS: Lower chest: Lung bases are clear. Hepatobiliary: Liver is within normal limits. Gallbladder is unremarkable. No intrahepatic or extrahepatic ductal dilatation. Pancreas: Within normal limits. Spleen: Within normal limits. Adrenals/Urinary Tract: Adrenal glands are within normal limits. Kidneys are within normal  limits.  No hydronephrosis. Bladder is thick-walled although underdistended. Stomach/Bowel: Stomach is within normal limits. No evidence of bowel obstruction. Normal appendix (series 2/image 62). No colonic wall thickening or inflammatory changes. Vascular/Lymphatic: No evidence of abdominal aortic aneurysm. Atherosclerotic calcifications of the abdominal aorta and branch vessels. No suspicious abdominopelvic lymphadenopathy. Reproductive: Uterus is within normal limits. Bilateral ovaries are within normal limits. Other: No abdominopelvic ascites. Musculoskeletal: Visualized osseous structures are within normal limits. IMPRESSION: No CT findings to account for the patient's abdominal pain. Electronically Signed   By: Charline Bills M.D.   On: 05/11/2022 01:51   US ABDOMEN LIMITED RUQ (LIVER/GB)  Result Date: 05/10/2022 CLINICAL DATA:  Abdominal pain, elevated LFTs EXAM: ULTRASOUND ABDOMEN LIMITED RIGHT UPPER QUADRANT COMPARISON:  None Available. FINDINGS: Gallbladder: No gallstones or wall thickening visualized. No sonographic Murphy sign noted by sonographer. Common bile duct: Diameter: 4 mm Liver: Hyperechoic hepatic parenchyma, suggesting hepatic steatosis. No focal hepatic lesion is seen. Portal vein is patent on color Doppler imaging with normal direction of blood flow towards the liver. Other: None. IMPRESSION: Hepatic steatosis. Electronically Signed   By: Charline Bills M.D.   On: 05/10/2022 23:55      IMPRESSION AND PLAN:  Assessment and Plan: * Acute hepatitis - This is likely alcoholic.  It is associated with coagulopathy that may indicate an early liver cell failure. - The patient will be admitted to a medical bed. - Pain management will be provided. - We will continue hydration with normal saline. - We will follow serial LFTs. - We will obtain acute viral hepatitis panel. - A GI consultation will be obtained. - I notified Dr. Allegra Lai about the patient.  Alcohol abuse - She will  be placed on a banana bag daily. - We will place on as needed IV Ativan for potential withdrawal.    Anxiety and depression - We will continue Lexapro.  GERD (gastroesophageal reflux disease) - We will continue PPI therapy and IV for  Peripheral neuropathy - We will continue Neurontin.   DVT prophylaxis: SCDs.  Medical prophylaxis contraindicated due to thrombocytopenia and coagulopathy with fulminant hepatitis Advanced Care Planning:  Code Status: full code.  Family Communication:  The plan of care was discussed in details with the patient (and family). I answered all questions. The patient agreed to proceed with the above mentioned plan. Further management will depend upon hospital course. Disposition Plan: Back to previous home environment Consults called: Gastroenterology. All the records are reviewed and case discussed with ED provider.  Status is: Inpatient   At the time of the admission, it appears that the appropriate admission status for this patient is inpatient.  This is judged to be reasonable and necessary in order to provide the required intensity of service to ensure the patient's safety given the presenting symptoms, physical exam findings and initial radiographic and laboratory data in the context  of comorbid conditions.  The patient requires inpatient status due to high intensity of service, high risk of further deterioration and high frequency of surveillance required.  I certify that at the time of admission, it is my clinical judgment that the patient will require inpatient hospital care extending more than 2 midnights.                            Dispo: The patient is from: Home              Anticipated d/c is to: Home              Patient currently is not medically stable to d/c.              Difficult to place patient: No  Hannah BeatJan A Deserea Bordley M.D on 05/11/2022 at 4:39 AM  [facility]   From 7 PM-7 AM, contact night-coverage [email or web address]  CC: Primary care  physician; [facility], [employer]

## 2022-05-12 DIAGNOSIS — D7589 Other specified diseases of blood and blood-forming organs: Secondary | ICD-10-CM | POA: Clinically undetermined

## 2022-05-12 DIAGNOSIS — B179 Acute viral hepatitis, unspecified: Secondary | ICD-10-CM | POA: Diagnosis not present

## 2022-05-12 DIAGNOSIS — E871 Hypo-osmolality and hyponatremia: Secondary | ICD-10-CM | POA: Diagnosis not present

## 2022-05-12 DIAGNOSIS — D72819 Decreased white blood cell count, unspecified: Secondary | ICD-10-CM | POA: Diagnosis not present

## 2022-05-12 DIAGNOSIS — E8809 Other disorders of plasma-protein metabolism, not elsewhere classified: Secondary | ICD-10-CM | POA: Diagnosis not present

## 2022-05-12 HISTORY — DX: Other specified diseases of blood and blood-forming organs: D75.89

## 2022-05-12 HISTORY — DX: Hypo-osmolality and hyponatremia: E87.1

## 2022-05-12 HISTORY — DX: Hypomagnesemia: E83.42

## 2022-05-12 HISTORY — DX: Other disorders of phosphorus metabolism: E83.39

## 2022-05-12 LAB — COMPREHENSIVE METABOLIC PANEL
ALT: 1017 U/L — ABNORMAL HIGH (ref 0–44)
AST: 2036 U/L — ABNORMAL HIGH (ref 15–41)
Albumin: 2.9 g/dL — ABNORMAL LOW (ref 3.5–5.0)
Alkaline Phosphatase: 111 U/L (ref 38–126)
Anion gap: 6 (ref 5–15)
BUN: 12 mg/dL (ref 6–20)
CO2: 27 mmol/L (ref 22–32)
Calcium: 8.3 mg/dL — ABNORMAL LOW (ref 8.9–10.3)
Chloride: 97 mmol/L — ABNORMAL LOW (ref 98–111)
Creatinine, Ser: 1.01 mg/dL — ABNORMAL HIGH (ref 0.44–1.00)
GFR, Estimated: >60 mL/min (ref >60)
Glucose, Bld: 86 mg/dL (ref 70–99)
Potassium: 3.6 mmol/L (ref 3.5–5.1)
Sodium: 130 mmol/L — ABNORMAL LOW (ref 135–145)
Total Bilirubin: 2.6 mg/dL — ABNORMAL HIGH (ref 0.3–1.2)
Total Protein: 5.2 g/dL — ABNORMAL LOW (ref 6.5–8.1)

## 2022-05-12 LAB — CBC WITH DIFFERENTIAL/PLATELET
Abs Immature Granulocytes: 0 10*3/uL (ref 0.00–0.07)
Basophils Absolute: 0 10*3/uL (ref 0.0–0.1)
Basophils Relative: 1 %
Eosinophils Absolute: 0 10*3/uL (ref 0.0–0.5)
Eosinophils Relative: 3 %
HCT: 34.4 % — ABNORMAL LOW (ref 36.0–46.0)
Hemoglobin: 11.6 g/dL — ABNORMAL LOW (ref 12.0–15.0)
Immature Granulocytes: 0 %
Lymphocytes Relative: 42 %
Lymphs Abs: 0.5 10*3/uL — ABNORMAL LOW (ref 0.7–4.0)
MCH: 32.2 pg (ref 26.0–34.0)
MCHC: 33.7 g/dL (ref 30.0–36.0)
MCV: 95.6 fL (ref 80.0–100.0)
Monocytes Absolute: 0 10*3/uL — ABNORMAL LOW (ref 0.1–1.0)
Monocytes Relative: 3 %
Neutro Abs: 0.6 10*3/uL — ABNORMAL LOW (ref 1.7–7.7)
Neutrophils Relative %: 51 %
Platelets: 41 10*3/uL — ABNORMAL LOW (ref 150–400)
RBC: 3.6 MIL/uL — ABNORMAL LOW (ref 3.87–5.11)
RDW: 12.4 % (ref 11.5–15.5)
Smear Review: NORMAL
WBC: 1.2 10*3/uL — CL (ref 4.0–10.5)
nRBC: 1.7 % — ABNORMAL HIGH (ref 0.0–0.2)

## 2022-05-12 LAB — PROTIME-INR
INR: 1.7 — ABNORMAL HIGH (ref 0.8–1.2)
Prothrombin Time: 19.5 seconds — ABNORMAL HIGH (ref 11.4–15.2)

## 2022-05-12 LAB — CBC
HCT: 34 % — ABNORMAL LOW (ref 36.0–46.0)
Hemoglobin: 11.4 g/dL — ABNORMAL LOW (ref 12.0–15.0)
MCH: 31.8 pg (ref 26.0–34.0)
MCHC: 33.5 g/dL (ref 30.0–36.0)
MCV: 95 fL (ref 80.0–100.0)
Platelets: 37 10*3/uL — ABNORMAL LOW (ref 150–400)
RBC: 3.58 MIL/uL — ABNORMAL LOW (ref 3.87–5.11)
RDW: 12.2 % (ref 11.5–15.5)
WBC: 1.2 10*3/uL — CL (ref 4.0–10.5)
nRBC: 0 % (ref 0.0–0.2)

## 2022-05-12 LAB — URINALYSIS, ROUTINE W REFLEX MICROSCOPIC
Bilirubin Urine: NEGATIVE
Glucose, UA: NEGATIVE mg/dL
Hgb urine dipstick: NEGATIVE
Ketones, ur: 5 mg/dL — AB
Leukocytes,Ua: NEGATIVE
Nitrite: NEGATIVE
Protein, ur: NEGATIVE mg/dL
Specific Gravity, Urine: 1.009 (ref 1.005–1.030)
pH: 6 (ref 5.0–8.0)

## 2022-05-12 LAB — MAGNESIUM: Magnesium: 1.3 mg/dL — ABNORMAL LOW (ref 1.7–2.4)

## 2022-05-12 LAB — PHOSPHORUS: Phosphorus: 2 mg/dL — ABNORMAL LOW (ref 2.5–4.6)

## 2022-05-12 MED ORDER — SODIUM CHLORIDE 0.9 % IV SOLN: INTRAVENOUS | Status: DC

## 2022-05-12 MED ORDER — NICOTINE POLACRILEX 2 MG MT GUM: 2.0000 mg | CHEWING_GUM | OROMUCOSAL | Status: DC | PRN

## 2022-05-12 MED ORDER — NICOTINE 14 MG/24HR TD PT24
14.0000 mg | MEDICATED_PATCH | Freq: Every day | TRANSDERMAL | Status: DC
  Administered 2022-05-15: 14 mg via TRANSDERMAL
  Filled 2022-05-12: qty 1

## 2022-05-12 MED ORDER — K PHOS MONO-SOD PHOS DI & MONO 155-852-130 MG PO TABS
500.0000 mg | ORAL_TABLET | Freq: Once | ORAL | Status: AC
  Administered 2022-05-12: 500 mg via ORAL
  Filled 2022-05-12: qty 2

## 2022-05-12 MED ORDER — MAGNESIUM SULFATE 4 GM/100ML IV SOLN
4.0000 g | Freq: Once | INTRAVENOUS | Status: AC
  Administered 2022-05-12: 4 g via INTRAVENOUS
  Filled 2022-05-12: qty 100

## 2022-05-12 NOTE — Plan of Care (Signed)

## 2022-05-12 NOTE — Progress Notes (Signed)
PHARMACY CONSULT NOTE  Pharmacy Consult for Electrolyte Monitoring and Replacement   Recent Labs: Potassium (mmol/L)  Date Value  05/12/2022 3.6  10/05/2013 3.9   Magnesium (mg/dL)  Date Value  25/49/8264 1.3 (L)   Calcium (mg/dL)  Date Value  15/83/0940 8.3 (L)   Calcium, Total (mg/dL)  Date Value  76/80/8811 9.5   Albumin (g/dL)  Date Value  03/07/9457 2.9 (L)  10/05/2013 4.4   Phosphorus (mg/dL)  Date Value  59/29/2446 2.0 (L)   Sodium (mmol/L)  Date Value  05/12/2022 130 (L)  10/05/2013 134 (L)     Assessment: 51 y.o. Caucasian female with medical history significant for alcohol abuse, GERD, hypertension, PTSD, anxiety and depression, who presented to the ER with abdominal pain. Pharmacy is asked to follow and replace electrolytes  Goal of Therapy:  Electrolytes WNL  Plan:  4 grams IV magnesium sulfate x 1 500 mg K-phos neutral po x 1 (this contains 16 mmol phosphorous and 2.2 mEq potassium) Recheck electrolytes in am  Lowella Bandy ,PharmD Clinical Pharmacist 05/12/2022 8:14 AM

## 2022-05-12 NOTE — Assessment & Plan Note (Addendum)
Magnesium 1.3 on 5/19, replaced. Monitor and replace mag as needed  Mg today 1.5 -further replacing

## 2022-05-12 NOTE — Hospital Course (Addendum)
51 year old female past medical history of alcohol abuse, GERD, hypertension, PTSD, anxiety, depression who was admitted 05/11/22 with acute liver failure felt secondary to alcoholic hepatitis.   GI consulted.

## 2022-05-12 NOTE — Assessment & Plan Note (Addendum)
Resolved.  Sodium was 136 on admission, has down trended. Na 136 >> 134 >> 130 >> 135. Initially on LR >> normal saline. --Tolerating p.o -- Off IV fluids -- Monitor BMP

## 2022-05-12 NOTE — Progress Notes (Addendum)
Lab called critical WBC 1.2 on pt. Anna Kern NP aware. Discussion of placing patient on protective precautions. New order noted. Educated patient about this type of isolation, may need to be reiterated to pt throughout the day.  0600- Patient placed on protective precautions

## 2022-05-12 NOTE — Consult Note (Signed)
Hematology/Oncology Consult note [facility] Telephone:([phone] [phone]  Patient Care Team: [place] as PCP - General Rico Junker, RN as Registered Nurse Theodore Demark, RN as Registered Nurse   Name of the patient: Anna Lucas  [id number]  1971/07/08    Reason for consult: Pancytopenia   Requesting physician: Dr. Nicole Kindred  Date of visit:05/12/2022    History of presenting illness- Patient is a 51 year old female presenting with abnormal LFTs secondary to acute hepatitis the cause of which was unclear.  Patient does have a history of chronic alcohol abuse but the acute liver failure was not attributed to it.  As far as her LFTs go her AST was elevated at 4577 and ALT 1859 on 05/10/2022 and presently at 2036 and 1017 respectively.  Bilirubin has remained stable to mildly increased at 2.6.  Hematology has been consulted for her pancytopenia.  Patient had a normal white cell count of 6.2 with an H&H of 14.7/43.7 with a platelet count of 93 on 05/10/2022.  Her white count has progressively gone down to 1.2 with an H&H of 11.4/34 and a platelet count of 37.  At baseline patient's platelet count fluctuated between 50s to 90s at least since 2016.  White cell count and hemoglobin has historically been normal.  Patient currently reports feeling well and reports some ongoing fatigue and nausea.  Denies any fever or mouth sores     Review of systems- Review of Systems  Constitutional:  Positive for malaise/fatigue. Negative for chills, fever and weight loss.  HENT:  Negative for congestion, ear discharge and nosebleeds.   Eyes:  Negative for blurred vision.  Respiratory:  Negative for cough, hemoptysis, sputum production, shortness of breath and wheezing.   Cardiovascular:  Negative for chest pain, palpitations, orthopnea and claudication.  Gastrointestinal:  Positive for nausea. Negative for abdominal pain, blood in stool,  constipation, diarrhea, heartburn, melena and vomiting.  Genitourinary:  Negative for dysuria, flank pain, frequency, hematuria and urgency.  Musculoskeletal:  Negative for back pain, joint pain and myalgias.  Skin:  Negative for rash.  Neurological:  Negative for dizziness, tingling, focal weakness, seizures, weakness and headaches.  Endo/Heme/Allergies:  Does not bruise/bleed easily.  Psychiatric/Behavioral:  Negative for depression and suicidal ideas. The patient does not have insomnia.    Allergies  Allergen Reactions   Prednisone     Feet swelling     Patient Active Problem List   Diagnosis Date Noted   Hyponatremia 05/12/2022   Hypophosphatemia 05/12/2022   Hypomagnesemia 05/12/2022   Hypoalbuminemia 05/12/2022   Leukopenia 05/12/2022   Myelosuppression 05/12/2022   Acute hepatitis 05/11/2022   Anxiety and depression 05/11/2022   GERD (gastroesophageal reflux disease) 05/11/2022   Peripheral neuropathy 05/11/2022   Alcohol abuse 05/11/2022   Thrombocytopenia ([place]) 05/11/2022   Colon cancer screening 02/07/2021   Intractable vomiting 08/17/2016   Pyelonephritis 12/19/2015     Past Medical History:  Diagnosis Date   Acid reflux    Alcohol abuse    Anxiety    Depression    Hypertension    PTSD (post-traumatic stress disorder)      Past Surgical History:  Procedure Laterality Date   CESAREAN SECTION      Social History   Socioeconomic History   Marital status: Single    Spouse name: Not on file   Number of children: Not on file   Years of education: Not on file   Highest education level: Not on file  Occupational History   Not on file  Tobacco Use   Smoking status: Every Day    Packs/day: 1.00    Types: Cigarettes   Smokeless tobacco: Never  Substance and Sexual Activity   Alcohol use: Yes    Comment: twice a week   Drug use: Never   Sexual activity: Not on file  Other Topics Concern   Not on file  Social History Narrative   Not on file    Social Determinants of Health   Financial Resource Strain: Not on file  Food Insecurity: Not on file  Transportation Needs: Not on file  Physical Activity: Not on file  Stress: Not on file  Social Connections: Not on file  Intimate Partner Violence: Not on file     Family History  Problem Relation Age of Onset   CAD Paternal Grandfather    Stroke Paternal Grandfather    Diabetes Mellitus II Maternal Grandmother    Emphysema Maternal Grandmother      Current Facility-Administered Medications:    0.9 %  sodium chloride infusion, , Intravenous, Continuous, Ezekiel Slocumb, DO, Last Rate: 75 mL/hr at 05/12/22 1311, Infusion Verify at 05/12/22 1311   acetaminophen (TYLENOL) tablet 650 mg, 650 mg, Oral, Q6H PRN **OR** acetaminophen (TYLENOL) suppository 650 mg, 650 mg, Rectal, Q6H PRN, Mansy, Jan A, MD   albuterol (PROVENTIL) (2.5 MG/3ML) 0.083% nebulizer solution 2.5 mg, 2.5 mg, Nebulization, Q6H PRN, Belue, Nathan S, RPH   citalopram (CELEXA) tablet 40 mg, 40 mg, Oral, Daily, Mansy, Jan A, MD, 40 mg at 67/89/38 1017   folic acid (FOLVITE) tablet 1 mg, 1 mg, Oral, Daily, Nicole Kindred A, DO, 1 mg at 05/12/22 0849   HYDROmorphone (DILAUDID) injection 1 mg, 1 mg, Intravenous, Q4H PRN, Mansy, Jan A, MD, 1 mg at 05/12/22 0858   hydrOXYzine (ATARAX) tablet 50 mg, 50 mg, Oral, TID PRN, Mansy, Jan A, MD, 50 mg at 05/11/22 0348   ketorolac (TORADOL) 15 MG/ML injection 15 mg, 15 mg, Intravenous, Q6H PRN, Nicole Kindred A, DO, 15 mg at 05/11/22 1423   LORazepam (ATIVAN) tablet 1-4 mg, 1-4 mg, Oral, Q1H PRN **OR** LORazepam (ATIVAN) injection 1-4 mg, 1-4 mg, Intravenous, Q1H PRN, Nicole Kindred A, DO, 2 mg at 05/11/22 1423   magnesium hydroxide (MILK OF MAGNESIA) suspension 30 mL, 30 mL, Oral, Daily PRN, Mansy, Jan A, MD   multivitamin with minerals tablet 1 tablet, 1 tablet, Oral, Daily, Nicole Kindred A, DO, 1 tablet at 05/12/22 0849   nicotine (NICODERM CQ - dosed in mg/24 hours) patch  14 mg, 14 mg, Transdermal, Daily, Nicole Kindred A, DO   nicotine polacrilex (NICORETTE) gum 2 mg, 2 mg, Oral, PRN, Nicole Kindred A, DO   ondansetron (ZOFRAN) tablet 4 mg, 4 mg, Oral, Q6H PRN **OR** ondansetron (ZOFRAN) injection 4 mg, 4 mg, Intravenous, Q6H PRN, Mansy, Jan A, MD, 4 mg at 05/11/22 2208   pantoprazole (PROTONIX) EC tablet 40 mg, 40 mg, Oral, Daily, Mansy, Jan A, MD, 40 mg at 05/12/22 5102   thiamine tablet 100 mg, 100 mg, Oral, Daily, 100 mg at 05/12/22 0849 **OR** thiamine (B-1) injection 100 mg, 100 mg, Intravenous, Daily, Nicole Kindred A, DO   traZODone (DESYREL) tablet 100 mg, 100 mg, Oral, QHS, Mansy, Jan A, MD, 100 mg at 05/11/22 2208   Physical exam:  Vitals:   05/11/22 1128 05/11/22 1955 05/12/22 0417 05/12/22 0824  BP: (!) 147/83 138/86 (!) 146/84 (!) 154/86  Pulse: 95 60 68 61  Resp:  _0 Temp: 100 F (37.8 C) 98.2 F (36.8 C) 98.9 F (37.2 C) 98.7 F (37.1 C)  TempSrc:  Oral Oral Oral  SpO2: 92% 95% 95% 99%  Weight:      Height:       Physical Exam Constitutional:      General: She is not in acute distress. HENT:     Mouth/Throat:     Mouth: Mucous membranes are moist.     Pharynx: Oropharynx is clear.  Cardiovascular:     Rate and Rhythm: Normal rate and regular rhythm.     Heart sounds: Normal heart sounds.  Pulmonary:     Effort: Pulmonary effort is normal.     Breath sounds: Normal breath sounds.  Abdominal:     General: Bowel sounds are normal.     Palpations: Abdomen is soft.  Skin:    General: Skin is warm and dry.  Neurological:     Mental Status: She is alert and oriented to person, place, and time.          Latest Ref Rng & Units 05/12/2022    4:38 AM  CMP  Glucose 70 - 99 mg/dL 86    BUN 6 - 20 mg/dL 12    Creatinine 0.44 - 1.00 mg/dL 1.01    Sodium 135 - 145 mmol/L 130    Potassium 3.5 - 5.1 mmol/L 3.6    Chloride 98 - 111 mmol/L 97    CO2 22 - 32 mmol/L 27    Calcium 8.9 - 10.3 mg/dL 8.3    Total Protein  6.5 - 8.1 g/dL 5.2    Total Bilirubin 0.3 - 1.2 mg/dL 2.6    Alkaline Phos 38 - 126 U/L 111    AST 15 - 41 U/L 2,036    ALT 0 - 44 U/L 1,017        Latest Ref Rng & Units 05/12/2022    4:38 AM  CBC  WBC 4.0 - 10.5 K/uL 1.2     1.2    Hemoglobin 12.0 - 15.0 g/dL 11.4     11.6    Hematocrit 36.0 - 46.0 % 34.0     34.4    Platelets 150 - 400 K/uL 37     41      _1 @  CT ABDOMEN PELVIS W CONTRAST  Result Date: 05/11/2022 CLINICAL DATA:  Abdominal pain, nausea/vomiting EXAM: CT ABDOMEN AND PELVIS WITH CONTRAST TECHNIQUE: Multidetector CT imaging of the abdomen and pelvis was performed using the standard protocol following bolus administration of intravenous contrast. RADIATION DOSE REDUCTION: This exam was performed according to the departmental dose-optimization program which includes automated exposure control, adjustment of the mA and/or kV according to patient size and/or use of iterative reconstruction technique. CONTRAST:  120m OMNIPAQUE IOHEXOL 300 MG/ML  SOLN COMPARISON:  Right upper quadrant abdominal ultrasound dated 05/10/2022. CT abdomen/pelvis dated 08/17/2016. FINDINGS: Lower chest: Lung bases are clear. Hepatobiliary: Liver is within normal limits. Gallbladder is unremarkable. No intrahepatic or extrahepatic ductal dilatation. Pancreas: Within normal limits. Spleen: Within normal limits. Adrenals/Urinary Tract: Adrenal glands are within normal limits. Kidneys are within normal limits.  No hydronephrosis. Bladder is thick-walled although underdistended. Stomach/Bowel: Stomach is within normal limits. No evidence of bowel obstruction. Normal appendix (series 2/image 62). No colonic wall thickening or inflammatory changes. Vascular/Lymphatic: No evidence of abdominal aortic aneurysm. Atherosclerotic calcifications of the abdominal aorta and branch vessels. No suspicious abdominopelvic lymphadenopathy. Reproductive: Uterus is within normal limits. Bilateral ovaries  are within normal  limits. Other: No abdominopelvic ascites. Musculoskeletal: Visualized osseous structures are within normal limits. IMPRESSION: No CT findings to account for the patient's abdominal pain. Electronically Signed   By: Julian Hy M.D.   On: 05/11/2022 01:51   US ABDOMEN LIMITED RUQ (LIVER/GB)  Result Date: 05/10/2022 CLINICAL DATA:  Abdominal pain, elevated LFTs EXAM: ULTRASOUND ABDOMEN LIMITED RIGHT UPPER QUADRANT COMPARISON:  None Available. FINDINGS: Gallbladder: No gallstones or wall thickening visualized. No sonographic Murphy sign noted by sonographer. Common bile duct: Diameter: 4 mm Liver: Hyperechoic hepatic parenchyma, suggesting hepatic steatosis. No focal hepatic lesion is seen. Portal vein is patent on color Doppler imaging with normal direction of blood flow towards the liver. Other: None. IMPRESSION: Hepatic steatosis. Electronically Signed   By: Julian Hy M.D.   On: 05/10/2022 23:55    Assessment and plan- Patient is a 51 y.o. female admitted for acute liver failure.  Hematology consulted for pancytopenia  Patient's white cell count was normal at 6.2 on 05/10/2022 with a platelet count of 93.Presently white cell count has drifted down to 1.2 with a platelet count of 37 and an H&H of 11.4/34. Given that her counts were relatively normal especially her white cell count just 2 days ago I suspect this is all bone marrow suppression in the setting of acute liver failure.  Although her acute liver failure appears to be improving sometimes bone marrow recovery can lag behind.  I am inclined to monitor this conservatively without the need for bone marrow biopsy at this time.  I will be checking ferritin and iron studies B12 folate TSH reticulocyte count at this time.  If there is a continued decrease in her counts over the next 2 to 3 days perhaps a bone marrow biopsy could be considered.  However this can often yield nonspecific findings in the setting of acute liver injury and therefore  best avoided at this time.  In the absence of clinical infection I will hold off on giving her Neupogen as of today    Visit Diagnosis 1. Alcohol-induced acute pancreatitis without infection or necrosis   2. Hepatitis, alcoholic, acute     Dr. Randa Evens, MD, MPH [facility] [phone] 05/12/2022

## 2022-05-12 NOTE — Assessment & Plan Note (Addendum)
On admission, WBC 6.2, Hbg 14.7, Plt 93k. All cell lines have down-trended, partially dilutional, but high suspicion for alcohol-induce myelosuppression.  5/20: WBC 1.2, platelet 37, hemoglobin 11.4.  Differential with lymphocytopenia, neutropenia with normal morphologies on smear.  5/21 WBC improved 2.1, platelets stable 36, anemia resolved 5/22 persistent leukopenia 1.9, platelets 32 5/23: wbc 1.7, plt 37k  --Repeat CBC in 1 week -- Consulted hematology - close outpatient follow up

## 2022-05-12 NOTE — Progress Notes (Addendum)
Progress Note   Patient: Anna Lucas:[id number] DOB: March 09, 1971 DOA: 05/10/2022     1 DOS: the patient was seen and examined on 05/12/2022   Brief hospital course: 51 year old female past medical history of alcohol abuse, GERD, hypertension, PTSD, anxiety, depression who was admitted 05/11/22 with acute liver failure felt secondary to alcoholic hepatitis.   GI consulted.  Assessment and Plan: * Acute hepatitis Initially AST 4577, ALT 1859, 2.3. alcoholic hepatitis.  Since transaminases greater than 8 times upper limit of normal, GI feels this is less likely to be due to alcoholic hepatitis. RUQ U/S showed hepatic steatosis. CT abd/pelvis non-acute. Acute hepatitis panel negative. Pt's hepatic synthetic function is compromised as evidenced by elevated INR.  5/20: INR improved to 1.7, transaminases improving.  Total bili still rising however.  --GI consulted --Daily CMP's, PT/INR --Follow pending EBV, CMV, ANA, anti-smooth muscle, anti-mitochondrial Ab's  Alcohol abuse CIWA protocol monitoring with PRN Ativan.  Start scheduled Librium if she has rising CIWA scores.  Anxiety and depression Continue Lexapro  GERD (gastroesophageal reflux disease) Continue IV PPI for now  Peripheral neuropathy Continue Neurontin.  Myelosuppression On admission, WBC 6.2, Hbg 14.7, Plt 93k. All cell lines have down-trended, partially dilutional, but high suspicion for alcohol-induce myelosuppression.  5/20: WBC 1.2, platelet 37, hemoglobin 11.4.  Differential with lymphocytopenia, neutropenia with normal morphologies on smear.  --Daily CBC's -- Consult hematology  Hypoalbuminemia Albumin on admission normal at 4.2. Downtrending since admission, 2.9. Likely negative acute phase reactant in the setting of acute alcoholic hepatitis. -- Monitor for third spacing -- Follow CMP  Hypomagnesemia Magnesium 1.3 on 5/19, replaced. Monitor and replace mag as  needed  Hypophosphatemia Phosphorus 2.0 this morning. Electrolyte replacement as per pharmacy. Monitor Phos daily  Hyponatremia Sodium was 136 on admission, has down trended. Na 136 >> 134 >> 130. Change LR to normal saline. Monitor volume status, consider echo to assess EF. She does not look volume overloaded clinically. -- Continue fluids per orders -- Monitor BMP        Subjective: Patient was awake resting in bed when seen on rounds today.  She reports overall feeling a little bit better.  Was able to tolerate some breakfast okay today without nausea vomiting.  She asked if she can go outside to smoke cigarettes.  Advised we have negative teen patches and gum available she declined saying she would rather go outside to smoke.  No other acute complaints at this time.  Physical Exam: Vitals:   05/11/22 1128 05/11/22 1955 05/12/22 0417 05/12/22 0824  BP: (!) 147/83 138/86 (!) 146/84 (!) 154/86  Pulse: 95 60 68 61  Resp: 18 20 16 18   Temp: 100 F (37.8 C) 98.2 F (36.8 C) 98.9 F (37.2 C) 98.7 F (37.1 C)  TempSrc:  Oral Oral Oral  SpO2: 92% 95% 95% 99%  Weight:      Height:       General exam: awake, alert, no acute distress HEENT: No scleral icterus, moist mucus membranes, hearing grossly normal  Respiratory system: On room air, normal respiratory effort. Cardiovascular system: RRR, no peripheral edema.   Gastrointestinal system: Abdomen soft and nontender Central nervous system: A&O x3. no gross focal neurologic deficits, normal speech Extremities: moves all, no edema, normal tone Skin: dry, intact, normal temperature, no jaundice Psychiatry: normal mood, congruent affect, judgement and insight appear normal   Data Reviewed:  Notable labs: Sodium declining 130, chloride 97, creatinine increased from 0.72 up to 1.01, calcium 8.3, phosphorus low  2.0, magnesium low 1.3, albumin low 2.9, AST improved to 036, ALT improved 1017, total bili increased from 2.2 up to  2.6.  CBC showing signs of myelosuppression (WBC following 1.2, hemoglobin 11.4, platelets 37  Family Communication: Spoke with patient's mother by phone on afternoon 5/19  Disposition: Status is: Inpatient Remains inpatient appropriate because: Severity of illness with ongoing acute liver failure and worsening leukopenia and thrombocytopenia, remains on IV therapies and ongoing evaluation not appropriate for outpatient setting.   Planned Discharge Destination: Home    Time spent: 40 minutes  Author: Pennie Banter, DO 05/12/2022 2:42 PM  For on call review [email or web address].

## 2022-05-12 NOTE — Progress Notes (Signed)
Lucilla Lame, MD [facility]   [address], [place], [place] [address] Phone: [phone] Fax : [phone]   Subjective: This patient was admitted with acute hepatitis with indices not consistent with alcoholic hepatitis despite her chronic alcohol abuse.  The patient's liver enzymes were over 4500 consistent with ischemia, acute viral infection or a medication/drug.  The patient states that her abdominal pain is better today.  The patient's liver enzymes are improving and her INR is slowly decreasing.   Objective: Vital signs in last 24 hours: Vitals:   05/11/22 1128 05/11/22 1955 05/12/22 0417 05/12/22 0824  BP: (!) 147/83 138/86 (!) 146/84 (!) 154/86  Pulse: 95 60 68 61  Resp: 18 20 16 18   Temp: 100 F (37.8 C) 98.2 F (36.8 C) 98.9 F (37.2 C) 98.7 F (37.1 C)  TempSrc:  Oral Oral Oral  SpO2: 92% 95% 95% 99%  Weight:      Height:       Weight change:   Intake/Output Summary (Last 24 hours) at 05/12/2022 1242 Last data filed at 05/12/2022 1046 Gross per 24 hour  Intake 3385.71 ml  Output --  Net 3385.71 ml     Exam: Heart:: Regular rate and rhythm or without murmur or extra heart sounds Lungs: normal and clear to auscultation and percussion Abdomen: Soft nondistended with diffuse tenderness   Lab Results: @LABTEST2 @ Micro Results: No results found for this or any previous visit (from the past 240 hour(s)). Studies/Results: CT ABDOMEN PELVIS W CONTRAST  Result Date: 05/11/2022 CLINICAL DATA:  Abdominal pain, nausea/vomiting EXAM: CT ABDOMEN AND PELVIS WITH CONTRAST TECHNIQUE: Multidetector CT imaging of the abdomen and pelvis was performed using the standard protocol following bolus administration of intravenous contrast. RADIATION DOSE REDUCTION: This exam was performed according to the departmental dose-optimization program which includes automated exposure control, adjustment of the mA and/or kV according to patient size and/or use of iterative  reconstruction technique. CONTRAST:  179mL OMNIPAQUE IOHEXOL 300 MG/ML  SOLN COMPARISON:  Right upper quadrant abdominal ultrasound dated 05/10/2022. CT abdomen/pelvis dated 08/17/2016. FINDINGS: Lower chest: Lung bases are clear. Hepatobiliary: Liver is within normal limits. Gallbladder is unremarkable. No intrahepatic or extrahepatic ductal dilatation. Pancreas: Within normal limits. Spleen: Within normal limits. Adrenals/Urinary Tract: Adrenal glands are within normal limits. Kidneys are within normal limits.  No hydronephrosis. Bladder is thick-walled although underdistended. Stomach/Bowel: Stomach is within normal limits. No evidence of bowel obstruction. Normal appendix (series 2/image 62). No colonic wall thickening or inflammatory changes. Vascular/Lymphatic: No evidence of abdominal aortic aneurysm. Atherosclerotic calcifications of the abdominal aorta and branch vessels. No suspicious abdominopelvic lymphadenopathy. Reproductive: Uterus is within normal limits. Bilateral ovaries are within normal limits. Other: No abdominopelvic ascites. Musculoskeletal: Visualized osseous structures are within normal limits. IMPRESSION: No CT findings to account for the patient's abdominal pain. Electronically Signed   By: Julian Hy M.D.   On: 05/11/2022 01:51   US ABDOMEN LIMITED RUQ (LIVER/GB)  Result Date: 05/10/2022 CLINICAL DATA:  Abdominal pain, elevated LFTs EXAM: ULTRASOUND ABDOMEN LIMITED RIGHT UPPER QUADRANT COMPARISON:  None Available. FINDINGS: Gallbladder: No gallstones or wall thickening visualized. No sonographic Murphy sign noted by sonographer. Common bile duct: Diameter: 4 mm Liver: Hyperechoic hepatic parenchyma, suggesting hepatic steatosis. No focal hepatic lesion is seen. Portal vein is patent on color Doppler imaging with normal direction of blood flow towards the liver. Other: None. IMPRESSION: Hepatic steatosis. Electronically Signed   By: Julian Hy M.D.   On: 05/10/2022 23:55    Medications: I  have reviewed the patient's current medications. Scheduled Meds:  citalopram  40 mg Oral Daily   folic acid  1 mg Oral Daily   multivitamin with minerals  1 tablet Oral Daily   nicotine  14 mg Transdermal Daily   pantoprazole  40 mg Oral Daily   phosphorus  500 mg Oral Once   thiamine  100 mg Oral Daily   Or   thiamine  100 mg Intravenous Daily   traZODone  100 mg Oral QHS   Continuous Infusions:  sodium chloride 75 mL/hr at 05/12/22 1046   PRN Meds:.acetaminophen **OR** acetaminophen, albuterol, HYDROmorphone (DILAUDID) injection, hydrOXYzine, ketorolac, LORazepam **OR** LORazepam, magnesium hydroxide, nicotine polacrilex, ondansetron **OR** ondansetron (ZOFRAN) IV   Assessment: Principal Problem:   Acute hepatitis Active Problems:   Anxiety and depression   GERD (gastroesophageal reflux disease)   Peripheral neuropathy   Alcohol abuse   Thrombocytopenia ([facility])   Hyponatremia   Hypophosphatemia   Hypomagnesemia   Hypoalbuminemia   Leukopenia   Myelosuppression    Plan: This patient has acute hepatitis unlikely from alcohol abuse since it is greater than 8 times the upper limit of normal with alcoholic hepatitis being less than 8 times the upper limit of normal.  This is more likely an acute insult to her liver.  She is improving and there is nothing further to do except for supportive care at this point.  If her synthetic function should deteriorate evident by increasing INR then a transfer to a tertiary care center would be appropriate.   LOS: 1 day   Lewayne Bunting 05/12/2022, 12:42 PM Pager [phone] 7am-5pm  Check AMION for 5pm -7am coverage and on weekends

## 2022-05-12 NOTE — Assessment & Plan Note (Addendum)
Phosphorus 2.0>>1.9 on 5/21. Electrolyte replacement as per pharmacy. Monitor Phos daily.  Phosphorus normal today 4.3

## 2022-05-12 NOTE — Assessment & Plan Note (Addendum)
Albumin on admission normal at 4.2. Downtrending since admission, 2.6>>2.0 today. Likely negative acute phase reactant in the setting of acute alcoholic hepatitis. -- Monitor for third spacing -- Follow CMP

## 2022-05-13 ENCOUNTER — Inpatient Hospital Stay: Payer: 59

## 2022-05-13 DIAGNOSIS — R7989 Other specified abnormal findings of blood chemistry: Secondary | ICD-10-CM | POA: Diagnosis present

## 2022-05-13 DIAGNOSIS — B179 Acute viral hepatitis, unspecified: Secondary | ICD-10-CM | POA: Diagnosis not present

## 2022-05-13 LAB — CBC WITH DIFFERENTIAL/PLATELET
Abs Immature Granulocytes: 0 10*3/uL (ref 0.00–0.07)
Basophils Absolute: 0 10*3/uL (ref 0.0–0.1)
Basophils Relative: 1 %
Eosinophils Absolute: 0.1 10*3/uL (ref 0.0–0.5)
Eosinophils Relative: 3 %
HCT: 40 % (ref 36.0–46.0)
Hemoglobin: 13.2 g/dL (ref 12.0–15.0)
Immature Granulocytes: 0 %
Lymphocytes Relative: 37 %
Lymphs Abs: 0.8 10*3/uL (ref 0.7–4.0)
MCH: 31.4 pg (ref 26.0–34.0)
MCHC: 33 g/dL (ref 30.0–36.0)
MCV: 95.2 fL (ref 80.0–100.0)
Monocytes Absolute: 0 10*3/uL — ABNORMAL LOW (ref 0.1–1.0)
Monocytes Relative: 2 %
Neutro Abs: 1.2 10*3/uL — ABNORMAL LOW (ref 1.7–7.7)
Neutrophils Relative %: 57 %
Platelets: 36 10*3/uL — ABNORMAL LOW (ref 150–400)
RBC: 4.2 MIL/uL (ref 3.87–5.11)
RDW: 12.4 % (ref 11.5–15.5)
WBC: 2.1 10*3/uL — ABNORMAL LOW (ref 4.0–10.5)
nRBC: 0 % (ref 0.0–0.2)

## 2022-05-13 LAB — ANTI-SMOOTH MUSCLE ANTIBODY, IGG: F-Actin IgG: 3 Units (ref 0–19)

## 2022-05-13 LAB — VITAMIN B12: Vitamin B-12: 1422 pg/mL — ABNORMAL HIGH (ref 180–914)

## 2022-05-13 LAB — COMPREHENSIVE METABOLIC PANEL
ALT: 792 U/L — ABNORMAL HIGH (ref 0–44)
AST: 1237 U/L — ABNORMAL HIGH (ref 15–41)
Albumin: 3.2 g/dL — ABNORMAL LOW (ref 3.5–5.0)
Alkaline Phosphatase: 168 U/L — ABNORMAL HIGH (ref 38–126)
Anion gap: 6 (ref 5–15)
BUN: 6 mg/dL (ref 6–20)
CO2: 29 mmol/L (ref 22–32)
Calcium: 8.1 mg/dL — ABNORMAL LOW (ref 8.9–10.3)
Chloride: 100 mmol/L (ref 98–111)
Creatinine, Ser: 0.81 mg/dL (ref 0.44–1.00)
GFR, Estimated: >60 mL/min (ref >60)
Glucose, Bld: 115 mg/dL — ABNORMAL HIGH (ref 70–99)
Potassium: 3.3 mmol/L — ABNORMAL LOW (ref 3.5–5.1)
Sodium: 135 mmol/L (ref 135–145)
Total Bilirubin: 2 mg/dL — ABNORMAL HIGH (ref 0.3–1.2)
Total Protein: 6.4 g/dL — ABNORMAL LOW (ref 6.5–8.1)

## 2022-05-13 LAB — PHOSPHORUS: Phosphorus: 1.9 mg/dL — ABNORMAL LOW (ref 2.5–4.6)

## 2022-05-13 LAB — CMV ANTIBODY, IGG (EIA): CMV Ab - IgG: 8.8 U/mL — ABNORMAL HIGH (ref 0.00–0.59)

## 2022-05-13 LAB — RETICULOCYTES
Immature Retic Fract: 12.5 % (ref 2.3–15.9)
RBC.: 4.11 MIL/uL (ref 3.87–5.11)
Retic Count, Absolute: 47.3 10*3/uL (ref 19.0–186.0)
Retic Ct Pct: 1.2 % (ref 0.4–3.1)

## 2022-05-13 LAB — FOLATE: Folate: 16.6 ng/mL (ref >5.9)

## 2022-05-13 LAB — IRON AND TIBC
Iron: 109 ug/dL (ref 28–170)
Saturation Ratios: 70 % — ABNORMAL HIGH (ref 10.4–31.8)
TIBC: 157 ug/dL — ABNORMAL LOW (ref 250–450)
UIBC: 48 ug/dL

## 2022-05-13 LAB — MITOCHONDRIAL ANTIBODIES: Mitochondrial M2 Ab, IgG: <20 Units (ref 0.0–20.0)

## 2022-05-13 LAB — PROTIME-INR
INR: 1.1 (ref 0.8–1.2)
Prothrombin Time: 14.3 seconds (ref 11.4–15.2)

## 2022-05-13 LAB — FERRITIN: Ferritin: >7500 ng/mL — ABNORMAL HIGH (ref 11–307)

## 2022-05-13 LAB — MAGNESIUM: Magnesium: 1.7 mg/dL (ref 1.7–2.4)

## 2022-05-13 MED ORDER — POTASSIUM CHLORIDE CRYS ER 20 MEQ PO TBCR
20.0000 meq | EXTENDED_RELEASE_TABLET | Freq: Once | ORAL | Status: AC
  Administered 2022-05-13: 20 meq via ORAL
  Filled 2022-05-13: qty 1

## 2022-05-13 MED ORDER — MAGNESIUM SULFATE 2 GM/50ML IV SOLN
2.0000 g | Freq: Once | INTRAVENOUS | Status: AC
  Administered 2022-05-13: 2 g via INTRAVENOUS
  Filled 2022-05-13: qty 50

## 2022-05-13 MED ORDER — GADOBUTROL 1 MMOL/ML IV SOLN
6.0000 mL | Freq: Once | INTRAVENOUS | Status: AC | PRN
  Administered 2022-05-13: 7.5 mL via INTRAVENOUS

## 2022-05-13 MED ORDER — K PHOS MONO-SOD PHOS DI & MONO 155-852-130 MG PO TABS
500.0000 mg | ORAL_TABLET | ORAL | Status: AC
  Administered 2022-05-13 (×3): 500 mg via ORAL
  Filled 2022-05-13 (×4): qty 2

## 2022-05-13 NOTE — Progress Notes (Signed)
Mobility Specialist - Progress Note    05/13/22 1300  Mobility  Activity Ambulated independently in hallway  Level of Assistance Independent  Assistive Device None  Distance Ambulated (ft) 500 ft  Activity Response Tolerated well  $Mobility charge 1 Mobility    Pt ambulates in hallway, NO AD voicing no complaints and returns to room with needs in reach.   Merrily Brittle Mobility Specialist 05/13/22, 1:35 PM

## 2022-05-13 NOTE — Assessment & Plan Note (Signed)
Likely due to acute liver failure and myelosuppression.  Monitor CBC. Hold DVT chemoprophylaxis and any heparin products.

## 2022-05-13 NOTE — Progress Notes (Signed)
[place] for Electrolyte Monitoring and Replacement   Recent Labs: Potassium (mmol/L)  Date Value  05/13/2022 3.3 (L)  10/05/2013 3.9   Magnesium (mg/dL)  Date Value  05/13/2022 1.7   Calcium (mg/dL)  Date Value  05/13/2022 8.1 (L)   Calcium, Total (mg/dL)  Date Value  10/05/2013 9.5   Albumin (g/dL)  Date Value  05/13/2022 3.2 (L)  10/05/2013 4.4   Phosphorus (mg/dL)  Date Value  05/13/2022 1.9 (L)   Sodium (mmol/L)  Date Value  05/13/2022 135  10/05/2013 134 (L)     Assessment: 51 y.o. Caucasian female with medical history significant for alcohol abuse, GERD, hypertension, PTSD, anxiety and depression, who presented to the ER with abdominal pain. Pharmacy is asked to follow and replace electrolytes  Goal of Therapy:  Electrolytes WNL  Plan:  2 grams IV magnesium sulfate x 1 500 mg K-phos neutral po x 2  20 mEq Kcl x 1 PO.  Recheck electrolytes in am  Oswald Hillock ,PharmD, BCPS Clinical Pharmacist 05/13/2022 9:41 AM

## 2022-05-13 NOTE — Progress Notes (Signed)
Progress Note   Patient: Anna Lucas GXQ:[id number] DOB: 07/01/1971 DOA: 05/10/2022     2 DOS: the patient was seen and examined on 05/13/2022   Brief hospital course: 51 year old female past medical history of alcohol abuse, GERD, hypertension, PTSD, anxiety, depression who was admitted 05/11/22 with acute liver failure felt secondary to alcoholic hepatitis.   GI consulted.  Assessment and Plan: * Acute hepatitis Initially AST 4577, ALT 1448, 2.3. alcoholic hepatitis.  Since transaminases greater than 8 times upper limit of normal, GI feels this is less likely to be due to alcoholic hepatitis.  RUQ U/S showed hepatic steatosis. CT abd/pelvis non-acute. Acute hepatitis panel negative. Pt's hepatic synthetic function is compromised as evidenced by elevated INR.  Also having myelosuppression. Labs: negative anti-smooth muscle Ab, negative anti-mitochondrial Ab  5/20: INR improved to 1.7, transaminases improving.  Total bili still rising however.  5/21: INR normalized 1.1, transaminases rapidly improving.    --GI consulted --Daily CMP's, PT/INR --Follow pending EBV, CMV IgM, ANA --Follow up hemochromatosis genetic screen --Liver MRI pending  Alcohol abuse CIWA protocol monitoring with PRN Ativan.  Start scheduled Librium if she has rising CIWA scores.  Anxiety and depression Continue Lexapro  GERD (gastroesophageal reflux disease) Continue PPI  Peripheral neuropathy Continue Neurontin.  Elevated ferritin With acute liver failure, ?hemochromatosis, but could also be simply acute phase reactant. --MRI Liver, check HFE DNA screen  Myelosuppression On admission, WBC 6.2, Hbg 14.7, Plt 93k. All cell lines have down-trended, partially dilutional, but high suspicion for alcohol-induce myelosuppression.  5/20: WBC 1.2, platelet 37, hemoglobin 11.4.  Differential with lymphocytopenia, neutropenia with normal morphologies on smear.  5/21 WBC improved 2.1, platelets stable 36,  anemia resolved  --Daily CBC's -- Consult hematology  Leukopenia Likely bone marrow suppression due to acute liver failure. WBC was normal on admission and down trended to nadir 1.2. -- Hematology consulted  WBC improved: 2.1  Hypoalbuminemia Albumin on admission normal at 4.2. Downtrending since admission, 2.6>>2.0 today. Likely negative acute phase reactant in the setting of acute alcoholic hepatitis. -- Monitor for third spacing -- Follow CMP  Hypomagnesemia Magnesium 1.3 on 5/19, replaced. Monitor and replace mag as needed  Mg today 1.7  Hypophosphatemia Phosphorus 2.0>>1.9 this morning. Electrolyte replacement as per pharmacy. Monitor Phos daily  Hyponatremia Resolved.  Sodium was 136 on admission, has down trended. Na 136 >> 134 >> 130 >> 135. Initially on LR >> normal saline. --Tolerating p.o., stop IV fluids -- Monitor BMP  Thrombocytopenia (HCC) Likely due to acute liver failure and myelosuppression.  Monitor CBC. Hold DVT chemoprophylaxis and any heparin products.        Subjective: Patient awake sitting up in bed when seen today.  She reports feeling quite a lot better, tolerating diet.  Hopes to go home soon, would really like to go outside.  No other acute complaints at this time  Physical Exam: Vitals:   05/12/22 2100 05/13/22 0456 05/13/22 0708 05/13/22 1555  BP: 124/68 (!) 162/92 129/89 135/82  Pulse:  63 64 67  Resp: 18 16 18 16   Temp: 98.9 F (37.2 C) 97.8 F (36.6 C) 98.7 F (37.1 C) 98.5 F (36.9 C)  TempSrc: Oral   Oral  SpO2: 99% 98% 100% 98%  Weight:      Height:       General exam: awake, alert, no acute distress, chronically ill-appearing HEENT: moist mucus membranes, hearing grossly normal  Respiratory system: normal respiratory effort. Cardiovascular system: RRR,  no pedal edema.  Gastrointestinal system: soft, ND Central nervous system: A&O x4. no gross focal neurologic deficits, normal speech Extremities: moves all,  no edema, normal tone Skin: dry, intact, normal temperature Psychiatry: normal mood, congruent affect, judgement and insight appear normal   Data Reviewed:  Notable labs: Potassium 3.3, glucose 115, calcium 8.1 with albumin 3.2, phosphorus 1.9, alk phos rising 168, albumin 3.2, AST improved to 1237, ALT improved 792, total protein 6.4, T. bili improved 2.0, iron panel normal iron 109, elevated TIBC 157, elevated sat ratio 70, very elevated ferritin greater than 7500, B12 elevated 1422, WBC improved 2.1, hemoglobin normal, platelets 36 from 37 with CBC differential showing resolved lymphocytopenia, improved neutrophil count.  INR normalized 1.1  Family Communication: Updated mother Fraser Din by phone this afternoon  Disposition: Status is: Inpatient Remains inpatient appropriate because: Severity of illness with persistent electrolyte abnormalities requiring monitoring and replacement, ongoing acute liver failure warranting further improvement prior to discharge.  Anticipate discharge in 24 to 48 hours if further improvement   Planned Discharge Destination: Home    Time spent: 45 minutes  Author: Ezekiel Slocumb, DO 05/13/2022 4:39 PM  For on call review [email or web address].

## 2022-05-13 NOTE — Assessment & Plan Note (Addendum)
Likely bone marrow suppression due to acute liver failure. WBC was normal on admission and down trended to nadir 1.2. -- Hematology consulted, follow-up recommendations  WBC today: 2.1 >> 1.9

## 2022-05-13 NOTE — Assessment & Plan Note (Addendum)
With acute liver failure, ?hemochromatosis, but could also be simply acute phase reactant. MRI liver was negative --Follow-up pending HFE DNA screen

## 2022-05-13 NOTE — Plan of Care (Signed)
  Problem: Clinical Measurements: Goal: Diagnostic test results will improve Outcome: Progressing   Problem: Activity: Goal: Risk for activity intolerance will decrease Outcome: Progressing   Problem: Nutrition: Goal: Adequate nutrition will be maintained Outcome: Progressing   Problem: Pain Managment: Goal: General experience of comfort will improve Outcome: Progressing   Problem: Safety: Goal: Ability to remain free from injury will improve Outcome: Progressing   

## 2022-05-13 NOTE — Progress Notes (Signed)
Midge Minium, MD [facility]   [address], [address] [place], [place] [address] Phone: [phone] Fax : [phone]  Subjective: This patient was admitted with acute hepatitis with decreased synthetic function indicated by increased INR.  The patient's INR has come back to normal and her liver enzymes have been improving. The patient had a hematology consult yesterday for leukopenia which was likely due to own marrow suppression and her white cell count came up from 1.2 yesterday to 2.1 today. The patient has had chronic thrombocytopenia as far back as 5 years ago indicative of advanced liver disease. The patient's liver enzymes have decreased by half again today and she was up sitting and eating lunch when I came to see her today.  The patient's ferritin was found to be over 7500 with a iron saturation of 70 with an iron level normal at 109.   Objective: Vital signs in last 24 hours: Vitals:   05/12/22 1530 05/12/22 2100 05/13/22 0456 05/13/22 0708  BP: 139/88 124/68 (!) 162/92 129/89  Pulse: 71  63 64  Resp: 18 18 16 18   Temp: 99.1 F (37.3 C) 98.9 F (37.2 C) 97.8 F (36.6 C) 98.7 F (37.1 C)  TempSrc: Oral Oral    SpO2: 96% 99% 98% 100%  Weight:      Height:       Weight change:   Intake/Output Summary (Last 24 hours) at 05/13/2022 05/15/2022 Last data filed at 05/12/2022 1744 Gross per 24 hour  Intake 1102.48 ml  Output 450 ml  Net 652.48 ml     Exam: Heart:: Regular rate and rhythm or without murmur or extra heart sounds Lungs: normal and clear to auscultation and percussion Abdomen: soft, nontender, normal bowel sounds   Lab Results: @LABTEST2 @ Micro Results: No results found for this or any previous visit (from the past 240 hour(s)). Studies/Results: No results found. Medications: I have reviewed the patient's current medications. Scheduled Meds:  citalopram  40 mg Oral Daily   folic acid  1 mg Oral Daily   multivitamin with minerals  1 tablet Oral Daily    nicotine  14 mg Transdermal Daily   pantoprazole  40 mg Oral Daily   thiamine  100 mg Oral Daily   Or   thiamine  100 mg Intravenous Daily   traZODone  100 mg Oral QHS   Continuous Infusions:  sodium chloride 75 mL/hr at 05/12/22 1738   PRN Meds:.acetaminophen **OR** acetaminophen, albuterol, HYDROmorphone (DILAUDID) injection, hydrOXYzine, ketorolac, LORazepam **OR** LORazepam, magnesium hydroxide, nicotine polacrilex, ondansetron **OR** ondansetron (ZOFRAN) IV   Assessment: Principal Problem:   Acute hepatitis Active Problems:   Anxiety and depression   GERD (gastroesophageal reflux disease)   Peripheral neuropathy   Alcohol abuse   Thrombocytopenia (HCC)   Hyponatremia   Hypophosphatemia   Hypomagnesemia   Hypoalbuminemia   Leukopenia   Myelosuppression    Plan: This patient came in with an acute hepatitis on top of chronic liver disease with an unknown inciting factor.  The patient has ferritin that is extremely high but ferritin is an acute phase reactant and the increased iron saturation is commonly seen in cirrhosis.  Also hemochromatosis is unlikely to have caused the acute hepatitis since this is a chronic disease.  The patient has done very well on supportive care and there is nothing further to do from a GI point of view.   LOS: 2 days   05/13/2022, 7:38 AM Pager [phone] 7am-5pm  Check AMION for 5pm -7am  coverage and on weekends

## 2022-05-14 DIAGNOSIS — R7989 Other specified abnormal findings of blood chemistry: Secondary | ICD-10-CM | POA: Diagnosis not present

## 2022-05-14 DIAGNOSIS — D7589 Other specified diseases of blood and blood-forming organs: Secondary | ICD-10-CM

## 2022-05-14 DIAGNOSIS — B179 Acute viral hepatitis, unspecified: Secondary | ICD-10-CM | POA: Diagnosis not present

## 2022-05-14 DIAGNOSIS — F1010 Alcohol abuse, uncomplicated: Secondary | ICD-10-CM | POA: Diagnosis not present

## 2022-05-14 LAB — COMPREHENSIVE METABOLIC PANEL
ALT: 549 U/L — ABNORMAL HIGH (ref 0–44)
AST: 611 U/L — ABNORMAL HIGH (ref 15–41)
Albumin: 2.7 g/dL — ABNORMAL LOW (ref 3.5–5.0)
Alkaline Phosphatase: 191 U/L — ABNORMAL HIGH (ref 38–126)
Anion gap: 8 (ref 5–15)
BUN: 9 mg/dL (ref 6–20)
CO2: 28 mmol/L (ref 22–32)
Calcium: 8.4 mg/dL — ABNORMAL LOW (ref 8.9–10.3)
Chloride: 100 mmol/L (ref 98–111)
Creatinine, Ser: 0.72 mg/dL (ref 0.44–1.00)
GFR, Estimated: >60 mL/min (ref >60)
Glucose, Bld: 117 mg/dL — ABNORMAL HIGH (ref 70–99)
Potassium: 4 mmol/L (ref 3.5–5.1)
Sodium: 136 mmol/L (ref 135–145)
Total Bilirubin: 1.4 mg/dL — ABNORMAL HIGH (ref 0.3–1.2)
Total Protein: 5.6 g/dL — ABNORMAL LOW (ref 6.5–8.1)

## 2022-05-14 LAB — CBC WITH DIFFERENTIAL/PLATELET
Abs Immature Granulocytes: 0.01 10*3/uL (ref 0.00–0.07)
Basophils Absolute: 0 10*3/uL (ref 0.0–0.1)
Basophils Relative: 1 %
Eosinophils Absolute: 0.1 10*3/uL (ref 0.0–0.5)
Eosinophils Relative: 4 %
HCT: 35.3 % — ABNORMAL LOW (ref 36.0–46.0)
Hemoglobin: 11.7 g/dL — ABNORMAL LOW (ref 12.0–15.0)
Immature Granulocytes: 1 %
Lymphocytes Relative: 32 %
Lymphs Abs: 0.6 10*3/uL — ABNORMAL LOW (ref 0.7–4.0)
MCH: 31.7 pg (ref 26.0–34.0)
MCHC: 33.1 g/dL (ref 30.0–36.0)
MCV: 95.7 fL (ref 80.0–100.0)
Monocytes Absolute: 0.1 10*3/uL (ref 0.1–1.0)
Monocytes Relative: 6 %
Neutro Abs: 1.1 10*3/uL — ABNORMAL LOW (ref 1.7–7.7)
Neutrophils Relative %: 56 %
Platelets: 32 10*3/uL — ABNORMAL LOW (ref 150–400)
RBC: 3.69 MIL/uL — ABNORMAL LOW (ref 3.87–5.11)
RDW: 12.9 % (ref 11.5–15.5)
WBC: 1.9 10*3/uL — ABNORMAL LOW (ref 4.0–10.5)
nRBC: 0 % (ref 0.0–0.2)

## 2022-05-14 LAB — LACTATE DEHYDROGENASE: LDH: 384 U/L — ABNORMAL HIGH (ref 98–192)

## 2022-05-14 LAB — ANA: Anti Nuclear Antibody (ANA): POSITIVE — AB

## 2022-05-14 LAB — MAGNESIUM: Magnesium: 1.5 mg/dL — ABNORMAL LOW (ref 1.7–2.4)

## 2022-05-14 LAB — PROTIME-INR
INR: 1 (ref 0.8–1.2)
Prothrombin Time: 13.4 seconds (ref 11.4–15.2)

## 2022-05-14 LAB — PHOSPHORUS: Phosphorus: 4.3 mg/dL (ref 2.5–4.6)

## 2022-05-14 LAB — FIBRINOGEN: Fibrinogen: 246 mg/dL (ref 210–475)

## 2022-05-14 LAB — D-DIMER, QUANTITATIVE: D-Dimer, Quant: 0.6 ug/mL-FEU — ABNORMAL HIGH (ref 0.00–0.50)

## 2022-05-14 MED ORDER — MAGNESIUM SULFATE 4 GM/100ML IV SOLN
4.0000 g | Freq: Once | INTRAVENOUS | Status: AC
Start: 2022-05-14 — End: 2022-05-15
  Administered 2022-05-14: 4 g via INTRAVENOUS
  Filled 2022-05-14: qty 100

## 2022-05-14 MED ORDER — CALCIUM CARBONATE ANTACID 500 MG PO CHEW
1.0000 | CHEWABLE_TABLET | Freq: Three times a day (TID) | ORAL | Status: DC | PRN
  Administered 2022-05-14: 200 mg via ORAL
  Filled 2022-05-14: qty 1

## 2022-05-14 NOTE — TOC Progression Note (Signed)
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 She will dc back home at d/c. Pt states no services at home are needed.         Expected Discharge Plan and Services                                                 Social Determinants of Health (SDOH) Interventions    Readmission Risk Interventions     View : No data to display.

## 2022-05-14 NOTE — Progress Notes (Addendum)
Hematology/Oncology Consult note [facility]  [phone] [phone]  Patient Care Team: [place] as PCP - General Rico Junker, RN as Registered Nurse Theodore Demark, RN as Registered Nurse   Name of the patient: Anna Lucas  [id number]  04/07/1971   Date of visit: 05/14/2022  Interval history-patient reports feeling overall better since admission.  She is sitting up and eating her lunch without any complaints of nausea or vomiting.  She has been afebrile in the last 24 to 48 hours  Pain scale- 0  Review of systems- Review of Systems  Constitutional:  Positive for malaise/fatigue. Negative for chills, fever and weight loss.  HENT:  Negative for congestion, ear discharge and nosebleeds.   Eyes:  Negative for blurred vision.  Respiratory:  Negative for cough, hemoptysis, sputum production, shortness of breath and wheezing.   Cardiovascular:  Negative for chest pain, palpitations, orthopnea and claudication.  Gastrointestinal:  Negative for abdominal pain, blood in stool, constipation, diarrhea, heartburn, melena, nausea and vomiting.  Genitourinary:  Negative for dysuria, flank pain, frequency, hematuria and urgency.  Musculoskeletal:  Negative for back pain, joint pain and myalgias.  Skin:  Negative for rash.  Neurological:  Negative for dizziness, tingling, focal weakness, seizures, weakness and headaches.  Endo/Heme/Allergies:  Does not bruise/bleed easily.  Psychiatric/Behavioral:  Negative for depression and suicidal ideas. The patient does not have insomnia.      Allergies  Allergen Reactions   Prednisone     Feet swelling      Past Medical History:  Diagnosis Date   Acid reflux    Alcohol abuse    Anxiety    Depression    Hypertension    PTSD (post-traumatic stress disorder)      Past Surgical History:  Procedure Laterality Date   CESAREAN SECTION      Social History    Socioeconomic History   Marital status: Single    Spouse name: Not on file   Number of children: Not on file   Years of education: Not on file   Highest education level: Not on file  Occupational History   Not on file  Tobacco Use   Smoking status: Every Day    Packs/day: 1.00    Types: Cigarettes   Smokeless tobacco: Never  Substance and Sexual Activity   Alcohol use: Yes    Comment: twice a week   Drug use: Never   Sexual activity: Not on file  Other Topics Concern   Not on file  Social History Narrative   Not on file   Social Determinants of Health   Financial Resource Strain: Not on file  Food Insecurity: Not on file  Transportation Needs: Not on file  Physical Activity: Not on file  Stress: Not on file  Social Connections: Not on file  Intimate Partner Violence: Not on file    Family History  Problem Relation Age of Onset   CAD Paternal Grandfather    Stroke Paternal Grandfather    Diabetes Mellitus II Maternal Grandmother    Emphysema Maternal Grandmother      Current Facility-Administered Medications:    acetaminophen (TYLENOL) tablet 650 mg, 650 mg, Oral, Q6H PRN **OR** acetaminophen (TYLENOL) suppository 650 mg, 650 mg, Rectal, Q6H PRN, Mansy, Jan A, MD   albuterol (PROVENTIL) (2.5 MG/3ML) 0.083% nebulizer solution 2.5 mg, 2.5 mg, Nebulization, Q6H PRN, Belue, Nathan S, RPH   citalopram (CELEXA) tablet 40 mg, 40 mg, Oral, Daily, Mansy,  Arvella Merles, MD, 40 mg at 23/55/73 2202   folic acid (FOLVITE) tablet 1 mg, 1 mg, Oral, Daily, Nicole Kindred A, DO, 1 mg at 05/14/22 0827   HYDROmorphone (DILAUDID) injection 1 mg, 1 mg, Intravenous, Q4H PRN, Mansy, Jan A, MD, 1 mg at 05/12/22 5427   hydrOXYzine (ATARAX) tablet 50 mg, 50 mg, Oral, TID PRN, Mansy, Jan A, MD, 50 mg at 05/11/22 0348   ketorolac (TORADOL) 15 MG/ML injection 15 mg, 15 mg, Intravenous, Q6H PRN, Nicole Kindred A, DO, 15 mg at 05/11/22 1423   magnesium hydroxide (MILK OF MAGNESIA) suspension 30 mL,  30 mL, Oral, Daily PRN, Mansy, Jan A, MD   multivitamin with minerals tablet 1 tablet, 1 tablet, Oral, Daily, Ezekiel Slocumb, DO, 1 tablet at 05/14/22 0623   nicotine (NICODERM CQ - dosed in mg/24 hours) patch 14 mg, 14 mg, Transdermal, Daily, Nicole Kindred A, DO   nicotine polacrilex (NICORETTE) gum 2 mg, 2 mg, Oral, PRN, Nicole Kindred A, DO   ondansetron (ZOFRAN) tablet 4 mg, 4 mg, Oral, Q6H PRN **OR** ondansetron (ZOFRAN) injection 4 mg, 4 mg, Intravenous, Q6H PRN, Mansy, Jan A, MD, 4 mg at 05/11/22 2208   pantoprazole (PROTONIX) EC tablet 40 mg, 40 mg, Oral, Daily, Mansy, Jan A, MD, 40 mg at 05/14/22 7628   thiamine tablet 100 mg, 100 mg, Oral, Daily, 100 mg at 05/14/22 0827 **OR** thiamine (B-1) injection 100 mg, 100 mg, Intravenous, Daily, Nicole Kindred A, DO   traZODone (DESYREL) tablet 100 mg, 100 mg, Oral, QHS, Mansy, Jan A, MD, 100 mg at 05/13/22 2318  Physical exam:  Vitals:   05/13/22 1555 05/13/22 2030 05/14/22 0549 05/14/22 0753  BP: 135/82 126/78 (!) 147/76 (!) 154/74  Pulse: 67 69 63 61  Resp: 16 15 15    Temp: 98.5 F (36.9 C) 99.1 F (37.3 C) 99.6 F (37.6 C) 98.5 F (36.9 C)  TempSrc: Oral Oral Oral   SpO2: 98% 100% 96% 96%  Weight:      Height:       Physical Exam Constitutional:      General: She is not in acute distress. Cardiovascular:     Rate and Rhythm: Normal rate and regular rhythm.     Heart sounds: Normal heart sounds.  Pulmonary:     Effort: Pulmonary effort is normal.     Breath sounds: Normal breath sounds.  Abdominal:     General: Bowel sounds are normal.     Palpations: Abdomen is soft.  Skin:    General: Skin is warm and dry.  Neurological:     Mental Status: She is alert and oriented to person, place, and time.        Latest Ref Rng & Units 05/14/2022    5:33 AM  CMP  Glucose 70 - 99 mg/dL 117    BUN 6 - 20 mg/dL 9    Creatinine 0.44 - 1.00 mg/dL 0.72    Sodium 135 - 145 mmol/L 136    Potassium 3.5 - 5.1 mmol/L 4.0     Chloride 98 - 111 mmol/L 100    CO2 22 - 32 mmol/L 28    Calcium 8.9 - 10.3 mg/dL 8.4    Total Protein 6.5 - 8.1 g/dL 5.6    Total Bilirubin 0.3 - 1.2 mg/dL 1.4    Alkaline Phos 38 - 126 U/L 191    AST 15 - 41 U/L 611    ALT 0 - 44 U/L 549  Latest Ref Rng & Units 05/14/2022    5:33 AM  CBC  WBC 4.0 - 10.5 K/uL 1.9    Hemoglobin 12.0 - 15.0 g/dL 11.7    Hematocrit 36.0 - 46.0 % 35.3    Platelets 150 - 400 K/uL 32      @IMAGES @  CT ABDOMEN PELVIS W CONTRAST  Result Date: 05/11/2022 CLINICAL DATA:  Abdominal pain, nausea/vomiting EXAM: CT ABDOMEN AND PELVIS WITH CONTRAST TECHNIQUE: Multidetector CT imaging of the abdomen and pelvis was performed using the standard protocol following bolus administration of intravenous contrast. RADIATION DOSE REDUCTION: This exam was performed according to the departmental dose-optimization program which includes automated exposure control, adjustment of the mA and/or kV according to patient size and/or use of iterative reconstruction technique. CONTRAST:  115m OMNIPAQUE IOHEXOL 300 MG/ML  SOLN COMPARISON:  Right upper quadrant abdominal ultrasound dated 05/10/2022. CT abdomen/pelvis dated 08/17/2016. FINDINGS: Lower chest: Lung bases are clear. Hepatobiliary: Liver is within normal limits. Gallbladder is unremarkable. No intrahepatic or extrahepatic ductal dilatation. Pancreas: Within normal limits. Spleen: Within normal limits. Adrenals/Urinary Tract: Adrenal glands are within normal limits. Kidneys are within normal limits.  No hydronephrosis. Bladder is thick-walled although underdistended. Stomach/Bowel: Stomach is within normal limits. No evidence of bowel obstruction. Normal appendix (series 2/image 62). No colonic wall thickening or inflammatory changes. Vascular/Lymphatic: No evidence of abdominal aortic aneurysm. Atherosclerotic calcifications of the abdominal aorta and branch vessels. No suspicious abdominopelvic lymphadenopathy. Reproductive:  Uterus is within normal limits. Bilateral ovaries are within normal limits. Other: No abdominopelvic ascites. Musculoskeletal: Visualized osseous structures are within normal limits. IMPRESSION: No CT findings to account for the patient's abdominal pain. Electronically Signed   By: SJulian HyM.D.   On: 05/11/2022 01:51   MR LIVER W WO CONTRAST  Result Date: 05/13/2022 CLINICAL DATA:  Cirrhosis. Elevated ferritin. Elevated liver function tests. Alcohol abuse. Suspect hemochromatosis. EXAM: MRI ABDOMEN WITHOUT AND WITH CONTRAST TECHNIQUE: Multiplanar multisequence MR imaging of the abdomen was performed both before and after the administration of intravenous contrast. CONTRAST:  7.571mGADAVIST GADOBUTROL 1 MMOL/ML IV SOLN COMPARISON:  CT on 05/11/2022 FINDINGS: Lower chest: No acute findings. Hepatobiliary: Normal signal intensity of the hepatic parenchyma, without evidence of iron overload or steatosis. No gross morphologic changes of cirrhosis noted. No hepatic masses identified. Gallbladder is unremarkable. No evidence of biliary ductal dilatation. Pancreas: No mass or inflammatory changes. No evidence iron deposition. Spleen: Within normal limits in size and appearance. Normal T2 signal intensity, without evidence of iron deposition. Adrenals/Urinary Tract: No masses identified. No evidence of hydronephrosis. Stomach/Bowel: Unremarkable. Vascular/Lymphatic: No pathologically enlarged lymph nodes identified. No acute vascular findings. Other:  None. Musculoskeletal: No suspicious bone lesions identified. No evidence of excess iron deposition in bone marrow. IMPRESSION: Negative. No radiographic evidence of hemochromatosis or other hepatobiliary disease. Electronically Signed   By: JoMarlaine Hind.D.   On: 05/13/2022 18:46   [place]BDOMEN LIMITED RUQ (LIVER/GB)  Result Date: 05/10/2022 CLINICAL DATA:  Abdominal pain, elevated LFTs EXAM: ULTRASOUND ABDOMEN LIMITED RIGHT UPPER QUADRANT COMPARISON:  None  Available. FINDINGS: Gallbladder: No gallstones or wall thickening visualized. No sonographic Murphy sign noted by sonographer. Common bile duct: Diameter: 4 mm Liver: Hyperechoic hepatic parenchyma, suggesting hepatic steatosis. No focal hepatic lesion is seen. Portal vein is patent on color Doppler imaging with normal direction of blood flow towards the liver. Other: None. IMPRESSION: Hepatic steatosis. Electronically Signed   By: SrJulian Hy.D.   On: 05/10/2022 23:55     Assessment  and plan- Patient is a 51 y.o. female admitted for acute hepatitis of unclear etiology complicated by pancytopenia  Pancytopenia: Platelet counts have been in the 30s for the last 3 days without a significant downtrend.  Mild improvement in her white cell count as compared to 05/12/2022 when it was 1.2 and presently at 1.9.  Hemoglobin has remained stable around 11.  Her abnormal LFTs as well as elevated ferritin and pancytopenia raises the concern for possible HLH.  [place] can be triggered by viral infections or autoimmune triggers.  Autoimmune liver work-up ordered by GI so far has been unremarkable.  I am sending of the EBV DNA PCR today.  It would be worthwhile to have ID on board to see if any other infectious work-up needs to be completed at this time.  No splenomegaly clinically as well as on CT.  CT abdomen also did not show any evidence of malignancy.  Rarely [place] can be precipitated by malignancy but usually infection is the main trigger.  Patient's PT/INR, fibrinogen is normal.  LDH is mildly elevated.  She is afebrile and clinically improving along with LFTs which are improving and I suspect her pancytopenia will also improve over the next few weeks.  No indication for bone marrow biopsy at this time.   Visit Diagnosis 1. Alcohol-induced acute pancreatitis without infection or necrosis   2. Hepatitis, alcoholic, acute      Dr. Randa Evens, MD, MPH [facility] [phone] 05/14/2022 4:00 PM

## 2022-05-14 NOTE — Progress Notes (Signed)
PHARMACY CONSULT NOTE  Pharmacy Consult for Electrolyte Monitoring and Replacement   Recent Labs: Potassium (mmol/L)  Date Value  05/14/2022 4.0  10/05/2013 3.9   Magnesium (mg/dL)  Date Value  09/98/3382 1.5 (L)   Calcium (mg/dL)  Date Value  50/53/9767 8.4 (L)   Calcium, Total (mg/dL)  Date Value  34/19/3790 9.5   Albumin (g/dL)  Date Value  24/08/7352 2.7 (L)  10/05/2013 4.4   Phosphorus (mg/dL)  Date Value  29/92/4268 4.3   Sodium (mmol/L)  Date Value  05/14/2022 136  10/05/2013 134 (L)     Assessment: 51 y.o. Caucasian female with medical history significant for alcohol abuse, GERD, hypertension, PTSD, anxiety and depression, who presented to the ER with abdominal pain. Pharmacy is asked to follow and replace electrolytes  Goal of Therapy:  Electrolytes WNL  Plan:  Scr 1.01>0.72 Mg 1.7>1.5: MgSO 4g IV x1 today (fell despite 2g IV yesterday) Phos 1.9>4.3: WNL; No further repletion at this time  K: 3.3>4: WNL; No further repletion at this time  Recheck electrolytes in am  Martyn Malay, PharmD, [facility] Clinical Pharmacist 05/14/2022 7:34 AM

## 2022-05-14 NOTE — Progress Notes (Signed)
Progress Note   Patient: Anna Lucas HAL:[id number] DOB: 02-07-1971 DOA: 05/10/2022     3 DOS: the patient was seen and examined on 05/14/2022   Brief hospital course: 51 year old female past medical history of alcohol abuse, GERD, hypertension, PTSD, anxiety, depression who was admitted 05/11/22 with acute liver failure felt secondary to alcoholic hepatitis.   GI consulted.  Assessment and Plan: * Acute hepatitis Initially AST 4577, ALT 7353, 2.3. alcoholic hepatitis.  Since transaminases greater than 8 times upper limit of normal, GI feels this is less likely to be due to alcoholic hepatitis.  RUQ U/S showed hepatic steatosis. CT abd/pelvis non-acute. Acute hepatitis panel negative. Pt's hepatic synthetic function is compromised as evidenced by elevated INR.  Also having myelosuppression. Labs: negative anti-smooth muscle Ab, negative anti-mitochondrial Ab MRI liver negative for signs of hemochromatosis or other hepatobiliary disease  5/22: AST/ALT continue to improve, alk phos rising.  T. bili improving 1.4.  INR normal 1.0  --GI consulted --Daily CMP's, PT/INR --Follow pending EBV, CMV IgM, ANA --Follow up hemochromatosis genetic screen -- Follow-up ID recommendations  Alcohol abuse CIWA protocol monitoring with PRN Ativan.  Start scheduled Librium if she has rising CIWA scores.  Anxiety and depression Continue Lexapro  GERD (gastroesophageal reflux disease) Continue PPI  Peripheral neuropathy Continue Neurontin.  Elevated ferritin With acute liver failure, ?hemochromatosis, but could also be simply acute phase reactant. MRI liver was negative --Follow-up pending HFE DNA screen  Myelosuppression On admission, WBC 6.2, Hbg 14.7, Plt 93k. All cell lines have down-trended, partially dilutional, but high suspicion for alcohol-induce myelosuppression.  5/20: WBC 1.2, platelet 37, hemoglobin 11.4.  Differential with lymphocytopenia, neutropenia with normal morphologies  on smear.  5/21 WBC improved 2.1, platelets stable 36, anemia resolved 5/22 persistent leukopenia 1.9, platelets 32  --Daily CBC's -- Consult hematology  Leukopenia Likely bone marrow suppression due to acute liver failure. WBC was normal on admission and down trended to nadir 1.2. -- Hematology consulted, follow-up recommendations  WBC today: 2.1 >> 1.9  Hypoalbuminemia Albumin on admission normal at 4.2. Downtrending since admission, 2.6>>2.0 today. Likely negative acute phase reactant in the setting of acute alcoholic hepatitis. -- Monitor for third spacing -- Follow CMP  Hypomagnesemia Magnesium 1.3 on 5/19, replaced. Monitor and replace mag as needed  Mg today 1.5 -further replacing  Hypophosphatemia Phosphorus 2.0>>1.9 on 5/21. Electrolyte replacement as per pharmacy. Monitor Phos daily.  Phosphorus normal today 4.3  Hyponatremia Resolved.  Sodium was 136 on admission, has down trended. Na 136 >> 134 >> 130 >> 135. Initially on LR >> normal saline. --Tolerating p.o -- Off IV fluids -- Monitor BMP  Thrombocytopenia (HCC) Likely due to acute liver failure and myelosuppression.  Monitor CBC. Hold DVT chemoprophylaxis and any heparin products.        Subjective: Patient awake resting in bed when seen on rounds today.  She reports feeling fairly well just tired but overall better.  Denies abdominal pain or nausea vomiting, tolerating diet.  She does hope to go home soon otherwise no acute complaints.  No acute events reported.  Physical Exam: Vitals:   05/13/22 1555 05/13/22 2030 05/14/22 0549 05/14/22 0753  BP: 135/82 126/78 (!) 147/76 (!) 154/74  Pulse: 67 69 63 61  Resp: _0 Temp: 98.5 F (36.9 C) 99.1 F (37.3 C) 99.6 F (37.6 C) 98.5 F (36.9 C)  TempSrc: Oral Oral Oral   SpO2: 98% 100% 96% 96%  Weight:      Height:  General exam: awake, alert, no acute distress, chronically ill-appearing HEENT: moist mucus membranes, hearing  grossly normal  Respiratory system: normal respiratory effort. Cardiovascular system: RRR,  no pedal edema.   Gastrointestinal system: soft, ND Central nervous system: A&O x4. no gross focal neurologic deficits, normal speech Extremities: moves all, no edema, normal tone Skin: dry, intact, normal temperature Psychiatry: normal mood, congruent affect, judgement and insight appear normal   Data Reviewed:  Notable labs: Glucose 117, calcium 8.4 with albumin 2.7, magnesium 1.5, alk phos rising 191, AST improved 611, ALT improved 549, total protein 5.6, total bili improved 1.4, LDH elevated 384, WBC 1.9, hemoglobin 11.7, platelets 32 down from 36   MRI liver yesterday: IMPRESSION: Negative. No radiographic evidence of hemochromatosis or other hepatobiliary disease.   Family Communication: Updated mother Fraser Din by phone this afternoon  Disposition: Status is: Inpatient Remains inpatient appropriate because: Severity of illness with persistent electrolyte abnormalities requiring monitoring and replacement, further evaluation still underway.    Anticipate discharge in 24 to 48 hours if further improvement   Planned Discharge Destination: Home    Time spent: 45 minutes  Author: Ezekiel Slocumb, DO 05/14/2022 4:15 PM  For on call review [email or web address].

## 2022-05-14 NOTE — Consult Note (Signed)
NAME: Anna Lucas  DOB: 1971-07-06  MRN: [id number]  Date/Time: 05/14/2022 12:34 PM  REQUESTING PROVIDER: Dr. Arbutus Ped Subjective:  REASON FOR CONSULT: Hepatitis ? Anna Lucas is a 51 y.o. female with a history of AUD presents with nausea, vomiting and having some cold and chills.  She was also having some stomach discomfort.  She was recently prescribed omeprazole but that was not helping. She works in assisted living.  She drinks a bit of alcohol every day She says she had a temperature of 99.  No recent travel No over-the-counter medications No consumption of excess Tylenol No cough or shortness of breath or upper respiratory infection No skin rash No consumption of raw seafood No joint pain She denies any outbreak of genital rash or cold sores In the ED on 05/10/2022 vitals BP of 121/79, temperature 98.3, pulse 73, respiratory 16 and sats 97%.  WBC 6.2, Hb 14.7, platelet 93 and creatinine 0.77.  AST was 4577 and ALT was 1859, albumin 2.9.  Total bilirubin 2.3.  Baseline is 3.1.  Patient had seen  GI a year ago and was suggested endoscopy and colonoscopy.  She never followed up.  Past Medical History:  Diagnosis Date   Acid reflux    Alcohol abuse    Anxiety    Depression    Hypertension    PTSD (post-traumatic stress disorder)     Past Surgical History:  Procedure Laterality Date   CESAREAN SECTION      Social History   Socioeconomic History   Marital status: Single    Spouse name: Not on file   Number of children: Not on file   Years of education: Not on file   Highest education level: Not on file  Occupational History   Not on file  Tobacco Use   Smoking status: Every Day    Packs/day: 1.00    Types: Cigarettes   Smokeless tobacco: Never  Substance and Sexual Activity   Alcohol use: Yes    Comment: twice a week   Drug use: Never   Sexual activity: Not on file  Other Topics Concern   Not on file  Social History Narrative   Not on file   Social  Determinants of Health   Financial Resource Strain: Not on file  Food Insecurity: Not on file  Transportation Needs: Not on file  Physical Activity: Not on file  Stress: Not on file  Social Connections: Not on file  Intimate Partner Violence: Not on file    Family History  Problem Relation Age of Onset   CAD Paternal Grandfather    Stroke Paternal Grandfather    Diabetes Mellitus II Maternal Grandmother    Emphysema Maternal Grandmother    Allergies  Allergen Reactions   Prednisone     Feet swelling    I? Current Facility-Administered Medications  Medication Dose Route Frequency Provider Last Rate Last Admin   acetaminophen (TYLENOL) tablet 650 mg  650 mg Oral Q6H PRN Mansy, Jan A, MD       Or   acetaminophen (TYLENOL) suppository 650 mg  650 mg Rectal Q6H PRN Mansy, Jan A, MD       albuterol (PROVENTIL) (2.5 MG/3ML) 0.083% nebulizer solution 2.5 mg  2.5 mg Nebulization Q6H PRN Renda Rolls, RPH       citalopram (CELEXA) tablet 40 mg  40 mg Oral Daily Mansy, Jan A, MD   40 mg at [id number]   folic acid (FOLVITE) tablet 1 mg  1  mg Oral Daily Nicole Kindred A, DO   1 mg at 05/14/22 [id number]   HYDROmorphone (DILAUDID) injection 1 mg  1 mg Intravenous Q4H PRN Mansy, Jan A, MD   1 mg at 05/12/22 [id number]   hydrOXYzine (ATARAX) tablet 50 mg  50 mg Oral TID PRN Mansy, Jan A, MD   50 mg at 05/11/22 0348   ketorolac (TORADOL) 15 MG/ML injection 15 mg  15 mg Intravenous Q6H PRN Nicole Kindred A, DO   15 mg at 05/11/22 1423   LORazepam (ATIVAN) tablet 1-4 mg  1-4 mg Oral Q1H PRN Ezekiel Slocumb, DO       Or   LORazepam (ATIVAN) injection 1-4 mg  1-4 mg Intravenous Q1H PRN Nicole Kindred A, DO   2 mg at 05/11/22 1423   magnesium hydroxide (MILK OF MAGNESIA) suspension 30 mL  30 mL Oral Daily PRN Mansy, Jan A, MD       multivitamin with minerals tablet 1 tablet  1 tablet Oral Daily Nicole Kindred A, DO   1 tablet at 05/14/22 [id number]   nicotine (NICODERM CQ - dosed in mg/24 hours) patch 14  mg  14 mg Transdermal Daily Nicole Kindred A, DO       nicotine polacrilex (NICORETTE) gum 2 mg  2 mg Oral PRN Ezekiel Slocumb, DO       ondansetron [facility]) tablet 4 mg  4 mg Oral Q6H PRN Mansy, Jan A, MD       Or   ondansetron [facility] injection 4 mg  4 mg Intravenous Q6H PRN Mansy, Jan A, MD   4 mg at 05/11/22 2208   pantoprazole (PROTONIX) EC tablet 40 mg  40 mg Oral Daily Mansy, Jan A, MD   40 mg at 05/14/22 [id number]   thiamine tablet 100 mg  100 mg Oral Daily Nicole Kindred A, DO   100 mg at 05/14/22 [id number]   Or   thiamine (B-1) injection 100 mg  100 mg Intravenous Daily Ezekiel Slocumb, DO       traZODone (DESYREL) tablet 100 mg  100 mg Oral QHS Mansy, Jan A, MD   100 mg at 05/13/22 2318     Abtx:  Anti-infectives (From admission, onward)    None       REVIEW OF SYSTEMS:  Const: Low-grade fever,  chills, negative weight loss Eyes: negative diplopia or visual changes, negative eye pain ENT: negative coryza, negative sore throat Resp: negative cough, hemoptysis, dyspnea Cards: negative for chest pain, palpitations, lower extremity edema GU: negative for frequency, dysuria and hematuria GI: As above skin: negative for rash and pruritus Heme: negative for easy bruising and gum/nose bleeding MS: negative for myalgias, arthralgias, back pain and muscle weakness Neurolo:negative for headaches, dizziness, vertigo, memory problems  Psych:anxiety, depression  Endocrine: negative for thyroid, diabetes Allergy/Immunology-prednisone caused feet swelling which is a side effect and not an allergic reaction Objective:  VITALS:  BP (!) 154/74 (BP Location: Right Arm)   Pulse 61   Temp 98.5 F (36.9 C)   Resp 15   Ht 5\' 5"  (1.651 m)   Wt 67.6 kg   LMP 07/24/2016   SpO2 96%   BMI 24.79 kg/m   PHYSICAL EXAM:  General: Alert, cooperative, no distress, appears stated age.  Head: Normocephalic, without obvious abnormality, atraumatic. Eyes: Conjunctivae clear, anicteric sclerae.  Pupils are equal ENT Nares normal. No drainage or sinus tenderness. Lips, mucosa, and tongue normal. No Thrush Neck: Supple, symmetrical, no adenopathy, thyroid: non tender  no carotid bruit and no JVD. Back: No CVA tenderness. Lungs: Clear to auscultation bilaterally. No Wheezing or Rhonchi. No rales. Heart: Regular rate and rhythm, no murmur, rub or gallop. Abdomen: Soft, non-tender,not distended. Bowel sounds normal. No masses Extremities: atraumatic, no cyanosis. No edema. No clubbing Skin: No rashes or lesions. Or bruising Lymph: Cervical, supraclavicular normal. Neurologic: Grossly non-focal Pertinent Labs Lab Results CBC    Component Value Date/Time   WBC 1.9 (L) 05/14/2022 0533   RBC 3.69 (L) 05/14/2022 0533   HGB 11.7 (L) 05/14/2022 0533   HGB 14.6 10/05/2013 1836   HCT 35.3 (L) 05/14/2022 0533   HCT 41.2 10/05/2013 1836   PLT 32 (L) 05/14/2022 0533   PLT 224 10/05/2013 1836   MCV 95.7 05/14/2022 0533   MCV 105 (H) 10/05/2013 1836   MCH 31.7 05/14/2022 0533   MCHC 33.1 05/14/2022 0533   RDW 12.9 05/14/2022 0533   RDW 13.1 10/05/2013 1836   LYMPHSABS 0.6 (L) 05/14/2022 0533   MONOABS 0.1 05/14/2022 0533   EOSABS 0.1 05/14/2022 0533   BASOSABS 0.0 05/14/2022 0533       Latest Ref Rng & Units 05/14/2022    5:33 AM 05/13/2022    7:51 AM 05/12/2022    4:38 AM  CMP  Glucose 70 - 99 mg/dL 117   115   86    BUN 6 - 20 mg/dL 9   6   12     Creatinine 0.44 - 1.00 mg/dL 0.72   0.81   1.01    Sodium 135 - 145 mmol/L 136   135   130    Potassium 3.5 - 5.1 mmol/L 4.0   3.3   3.6    Chloride 98 - 111 mmol/L 100   100   97    CO2 22 - 32 mmol/L 28   29   27     Calcium 8.9 - 10.3 mg/dL 8.4   8.1   8.3    Total Protein 6.5 - 8.1 g/dL 5.6   6.4   5.2    Total Bilirubin 0.3 - 1.2 mg/dL 1.4   2.0   2.6    Alkaline Phos 38 - 126 U/L 191   168   111    AST 15 - 41 U/L 611   1,237   2,036    ALT 0 - 44 U/L 549   792   1,017       IMAGING RESULTS: CT abdomen pelvis normal  I  have personally reviewed the films ? Impression/Recommendation Patient presenting with abdominal pain, nausea vomiting.  Found to have acute hepatitis with very high AST and ALT of more than 4000 Patient has not baseline history of alcohol use disorder.  Need to rule out other concurrent causes of hepatitis.  Acute hepatitis panel is negative Because of leukopenia and thrombocytopenia which could be related to alcohol but still need to rule out viral infections including Epstein-Barr virus, CMV, herpes.  We will send DNA of all the 3.  Increased ferritin.  More than 7500.  With bicytopenia [place] remote possibility as per heme-onc.   No hemolysis ________ Discussed with patient, requesting provider Note:  This document was prepared using Dragon voice recognition software and may include unintentional dictation errors.

## 2022-05-15 ENCOUNTER — Other Ambulatory Visit: Payer: Self-pay

## 2022-05-15 DIAGNOSIS — D696 Thrombocytopenia, unspecified: Secondary | ICD-10-CM

## 2022-05-15 DIAGNOSIS — B179 Acute viral hepatitis, unspecified: Secondary | ICD-10-CM | POA: Diagnosis not present

## 2022-05-15 LAB — CBC WITH DIFFERENTIAL/PLATELET
Abs Immature Granulocytes: 0.01 10*3/uL (ref 0.00–0.07)
Basophils Absolute: 0 10*3/uL (ref 0.0–0.1)
Basophils Relative: 1 %
Eosinophils Absolute: 0 10*3/uL (ref 0.0–0.5)
Eosinophils Relative: 2 %
HCT: 35.6 % — ABNORMAL LOW (ref 36.0–46.0)
Hemoglobin: 11.9 g/dL — ABNORMAL LOW (ref 12.0–15.0)
Immature Granulocytes: 1 %
Lymphocytes Relative: 55 %
Lymphs Abs: 1 10*3/uL (ref 0.7–4.0)
MCH: 31.7 pg (ref 26.0–34.0)
MCHC: 33.4 g/dL (ref 30.0–36.0)
MCV: 94.9 fL (ref 80.0–100.0)
Monocytes Absolute: 0.2 10*3/uL (ref 0.1–1.0)
Monocytes Relative: 10 %
Neutro Abs: 0.5 10*3/uL — ABNORMAL LOW (ref 1.7–7.7)
Neutrophils Relative %: 31 %
Platelets: 37 10*3/uL — ABNORMAL LOW (ref 150–400)
RBC: 3.75 MIL/uL — ABNORMAL LOW (ref 3.87–5.11)
RDW: 13.2 % (ref 11.5–15.5)
WBC: 1.7 10*3/uL — ABNORMAL LOW (ref 4.0–10.5)
nRBC: 1.2 % — ABNORMAL HIGH (ref 0.0–0.2)

## 2022-05-15 LAB — PROTIME-INR
INR: 1 (ref 0.8–1.2)
Prothrombin Time: 12.7 seconds (ref 11.4–15.2)

## 2022-05-15 LAB — COMPREHENSIVE METABOLIC PANEL
ALT: 520 U/L — ABNORMAL HIGH (ref 0–44)
AST: 480 U/L — ABNORMAL HIGH (ref 15–41)
Albumin: 3 g/dL — ABNORMAL LOW (ref 3.5–5.0)
Alkaline Phosphatase: 216 U/L — ABNORMAL HIGH (ref 38–126)
Anion gap: 7 (ref 5–15)
BUN: 10 mg/dL (ref 6–20)
CO2: 29 mmol/L (ref 22–32)
Calcium: 9 mg/dL (ref 8.9–10.3)
Chloride: 101 mmol/L (ref 98–111)
Creatinine, Ser: 0.73 mg/dL (ref 0.44–1.00)
GFR, Estimated: >60 mL/min (ref >60)
Glucose, Bld: 106 mg/dL — ABNORMAL HIGH (ref 70–99)
Potassium: 4 mmol/L (ref 3.5–5.1)
Sodium: 137 mmol/L (ref 135–145)
Total Bilirubin: 1.6 mg/dL — ABNORMAL HIGH (ref 0.3–1.2)
Total Protein: 6 g/dL — ABNORMAL LOW (ref 6.5–8.1)

## 2022-05-15 LAB — MAGNESIUM: Magnesium: 1.7 mg/dL (ref 1.7–2.4)

## 2022-05-15 LAB — PHOSPHORUS: Phosphorus: 4.4 mg/dL (ref 2.5–4.6)

## 2022-05-15 LAB — TRIGLYCERIDES: Triglycerides: 184 mg/dL — ABNORMAL HIGH (ref <150)

## 2022-05-15 MED ORDER — AMLODIPINE BESYLATE 5 MG PO TABS
5.0000 mg | ORAL_TABLET | Freq: Every day | ORAL | Status: DC
  Administered 2022-05-15: 5 mg via ORAL
  Filled 2022-05-15: qty 1

## 2022-05-15 MED ORDER — ADULT MULTIVITAMIN W/MINERALS CH: 1.0000 | ORAL_TABLET | Freq: Every day | ORAL | Status: DC

## 2022-05-15 MED ORDER — AMLODIPINE BESYLATE 5 MG PO TABS: 5.0000 mg | ORAL_TABLET | Freq: Every day | ORAL | 2 refills | Status: DC

## 2022-05-15 MED ORDER — MAGNESIUM SULFATE 2 GM/50ML IV SOLN
2.0000 g | Freq: Once | INTRAVENOUS | Status: AC
  Administered 2022-05-15: 2 g via INTRAVENOUS
  Filled 2022-05-15: qty 50

## 2022-05-15 NOTE — Progress Notes (Signed)
PHARMACY CONSULT NOTE  Pharmacy Consult for Electrolyte Monitoring and Replacement   Recent Labs: Potassium (mmol/L)  Date Value  05/15/2022 4.0  10/05/2013 3.9   Magnesium (mg/dL)  Date Value  93/23/5573 1.7   Calcium (mg/dL)  Date Value  22/01/5426 9.0   Calcium, Total (mg/dL)  Date Value  06/16/7627 9.5   Albumin (g/dL)  Date Value  31/51/7616 3.0 (L)  10/05/2013 4.4   Phosphorus (mg/dL)  Date Value  07/37/1062 4.4   Sodium (mmol/L)  Date Value  05/15/2022 137  10/05/2013 134 (L)     Assessment: 51 y.o. Caucasian female with medical history significant for alcohol abuse, GERD, hypertension, PTSD, anxiety and depression, who presented to the ER with abdominal pain. Pharmacy is asked to follow and replace electrolytes  Goal of Therapy:  Electrolytes WNL  Plan:  2 grams IV magnesium sulfate x 1 Recheck electrolytes in am  Burnis Medin, PharmD, BCPS Clinical Pharmacist 05/15/2022 7:18 AM

## 2022-05-15 NOTE — Discharge Summary (Addendum)
Physician Discharge Summary   Patient: Anna Lucas MRN: [id number] DOB: 01-Apr-1971  Admit date:     05/10/2022  Discharge date: 05/15/2022  Discharge Physician: Pennie Banter   PCP: [facility]   Recommendations at discharge:   Follow up with hematology/oncology (Dr. Smith Robert) as scheduled Follow up with Primary Care in 1-2 weeks Repeat CBC, CMP, Mg within 1 week Avoid contact with other people that might be sick, until low WBC improves Use diligent caution to avoid falls or injuries due to bleed risk with low Platelet count   Discharge Diagnoses: Principal Problem:   Acute hepatitis Active Problems:   Alcohol abuse   Anxiety and depression   GERD (gastroesophageal reflux disease)   Peripheral neuropathy   Thrombocytopenia (HCC)   Hypoalbuminemia   Leukopenia   Myelosuppression   Elevated ferritin  Resolved Problems:   Hyponatremia   Hypophosphatemia   Hypomagnesemia  Hospital Course: 51 year old female past medical history of alcohol abuse, GERD, hypertension, PTSD, anxiety, depression who was admitted 05/11/22 with acute liver failure felt secondary to alcoholic hepatitis.   GI consulted.  Assessment and Plan: * Acute hepatitis Initially AST 4577, ALT 1859, 2.3. alcoholic hepatitis.  Since transaminases greater than 8 times upper limit of normal, GI feels this is less likely to be due to alcoholic hepatitis.  RUQ U/S showed hepatic steatosis. CT abd/pelvis non-acute. Acute hepatitis panel negative. Pt's hepatic synthetic function is compromised as evidenced by elevated INR.  Also having myelosuppression. Labs: negative anti-smooth muscle Ab, negative anti-mitochondrial Ab MRI liver negative for signs of hemochromatosis or other hepatobiliary disease  5/23: AST/ALT continue to improve  --GI consulted, signed off -- Follow-up ID recommendations --Repeat CMP's, PT/INR in 1 week --Follow pending EBV, CMV IgM, ANA --Follow up hemochromatosis  genetic screen   Alcohol abuse No significant withdrawal occurred. Monitored on CIWA protocol.  Anxiety and depression Continue Lexapro  GERD (gastroesophageal reflux disease) Continue PPI  Peripheral neuropathy Continue Neurontin.  Elevated ferritin With acute liver failure, ?hemochromatosis, but could also be simply acute phase reactant. MRI liver was negative --Follow-up pending HFE DNA screen  Myelosuppression On admission, WBC 6.2, Hbg 14.7, Plt 93k. All cell lines have down-trended, partially dilutional, but high suspicion for alcohol-induce myelosuppression.  5/20: WBC 1.2, platelet 37, hemoglobin 11.4.  Differential with lymphocytopenia, neutropenia with normal morphologies on smear.  5/21 WBC improved 2.1, platelets stable 36, anemia resolved 5/22 persistent leukopenia 1.9, platelets 32 5/23: wbc 1.7, plt 37k  --Repeat CBC in 1 week -- Consulted hematology - close outpatient follow up  Leukopenia Likely bone marrow suppression due to acute liver failure. WBC was normal on admission and down trended to nadir 1.2. -- Hematology consulted, follow-up recommendations  WBC today: 2.1 >> 1.9  Hypoalbuminemia Albumin on admission normal at 4.2. Downtrending since admission, 2.6>>2.0 today. Likely negative acute phase reactant in the setting of acute alcoholic hepatitis. -- Monitor for third spacing -- Follow CMP  Thrombocytopenia (HCC) Likely due to acute liver failure and myelosuppression.  Monitor CBC. Hold DVT chemoprophylaxis and any heparin products.  Hypomagnesemia-resolved as of 05/24/2022 Magnesium 1.3 on 5/19, replaced. Monitor and replace mag as needed  Mg today 1.5 -further replacing  Hypophosphatemia-resolved as of 05/24/2022 Phosphorus 2.0>>1.9 on 5/21. Electrolyte replacement as per pharmacy. Monitor Phos daily.  Phosphorus normal today 4.3  Hyponatremia-resolved as of 05/24/2022 Resolved.  Sodium was 136 on admission, has down trended. Na  136 >> 134 >> 130 >> 135. Initially on LR >> normal saline. --  Tolerating p.o -- Off IV fluids -- Monitor BMP   Acute Pancreatitis - ruled out.  Lipase was mildly elevated, but patient clinically did not have lipase or imaging findings of it.  Presenting symptoms attributed to acute hepatitis.  Pt advised cessation of alcohol to prevent pancreatitis.      Consultants: GI, Hematology, Infectious disease Procedures performed: none  Disposition: Home Diet recommendation:  Regular diet DISCHARGE MEDICATION: Allergies as of 05/15/2022       Reactions   Prednisone    Feet swelling        Medication List     TAKE these medications    albuterol 108 (90 Base) MCG/ACT inhaler Commonly known as: VENTOLIN HFA Inhale 2 puffs into the lungs every 6 (six) hours as needed for wheezing or shortness of breath.   amLODipine 5 MG tablet Commonly known as: NORVASC Take 1 tablet (5 mg total) by mouth daily.   citalopram 40 MG tablet Commonly known as: CELEXA Take 40 mg by mouth daily.   gabapentin 300 MG capsule Commonly known as: NEURONTIN Take 300 mg by mouth 3 (three) times daily.   hydrOXYzine 50 MG tablet Commonly known as: ATARAX Take 50 mg by mouth 3 (three) times daily as needed.   multivitamin with minerals Tabs tablet Take 1 tablet by mouth daily.   omeprazole 40 MG capsule Commonly known as: PRILOSEC Take 40 mg by mouth daily.   traZODone 100 MG tablet Commonly known as: DESYREL Take 100 mg by mouth at bedtime.        Follow-up Information     [facility] Schedule an appointment as soon as possible for a visit on 05/24/2022.   Why: Hospital Follow up app 6:40am Contact information: [address] [place] [address] [phone]         Creig Hinesao, Archana C, MD. Call on 05/25/2022.   Specialty: Oncology Why: Call to confirm follow up appointment date/time with Dr. Smith Robertao app at 10:00am Contact information: [address] [place]  [id number] [phone]                Discharge Exam: Ceasar MonsFiled Weights   05/10/22 2046  Weight: 67.6 kg   General exam: awake, alert, no acute distress HEENT: atraumatic, clear conjunctiva, anicteric sclera, moist mucus membranes, hearing grossly normal  Respiratory system: CTAB, no wheezes, rales or rhonchi, normal respiratory effort. Cardiovascular system: normal S1/S2, RRR,no pedal edema.   Gastrointestinal system: soft, NT, ND Central nervous system: A&O x4. no gross focal neurologic deficits, normal speech Extremities: moves all, no edema, normal tone Skin: dry, intact, normal temperature Psychiatry: normal mood, congruent affect, judgement and insight appear normal   Condition at discharge: stable  The results of significant diagnostics from this hospitalization (including imaging, microbiology, ancillary and laboratory) are listed below for reference.   Imaging Studies: CT ABDOMEN PELVIS W CONTRAST  Result Date: 05/11/2022 CLINICAL DATA:  Abdominal pain, nausea/vomiting EXAM: CT ABDOMEN AND PELVIS WITH CONTRAST TECHNIQUE: Multidetector CT imaging of the abdomen and pelvis was performed using the standard protocol following bolus administration of intravenous contrast. RADIATION DOSE REDUCTION: This exam was performed according to the departmental dose-optimization program which includes automated exposure control, adjustment of the mA and/or kV according to patient size and/or use of iterative reconstruction technique. CONTRAST:  100mL OMNIPAQUE IOHEXOL 300 MG/ML  SOLN COMPARISON:  Right upper quadrant abdominal ultrasound dated 05/10/2022. CT abdomen/pelvis dated 08/17/2016. FINDINGS: Lower chest: Lung bases are clear. Hepatobiliary: Liver is within normal limits.  Gallbladder is unremarkable. No intrahepatic or extrahepatic ductal dilatation. Pancreas: Within normal limits. Spleen: Within normal limits. Adrenals/Urinary Tract: Adrenal glands are within normal limits. Kidneys are  within normal limits.  No hydronephrosis. Bladder is thick-walled although underdistended. Stomach/Bowel: Stomach is within normal limits. No evidence of bowel obstruction. Normal appendix (series 2/image 62). No colonic wall thickening or inflammatory changes. Vascular/Lymphatic: No evidence of abdominal aortic aneurysm. Atherosclerotic calcifications of the abdominal aorta and branch vessels. No suspicious abdominopelvic lymphadenopathy. Reproductive: Uterus is within normal limits. Bilateral ovaries are within normal limits. Other: No abdominopelvic ascites. Musculoskeletal: Visualized osseous structures are within normal limits. IMPRESSION: No CT findings to account for the patient's abdominal pain. Electronically Signed   By: Charline Bills M.D.   On: 05/11/2022 01:51   MR LIVER W WO CONTRAST  Result Date: 05/13/2022 CLINICAL DATA:  Cirrhosis. Elevated ferritin. Elevated liver function tests. Alcohol abuse. Suspect hemochromatosis. EXAM: MRI ABDOMEN WITHOUT AND WITH CONTRAST TECHNIQUE: Multiplanar multisequence MR imaging of the abdomen was performed both before and after the administration of intravenous contrast. CONTRAST:  7.57mL GADAVIST GADOBUTROL 1 MMOL/ML IV SOLN COMPARISON:  CT on 05/11/2022 FINDINGS: Lower chest: No acute findings. Hepatobiliary: Normal signal intensity of the hepatic parenchyma, without evidence of iron overload or steatosis. No gross morphologic changes of cirrhosis noted. No hepatic masses identified. Gallbladder is unremarkable. No evidence of biliary ductal dilatation. Pancreas: No mass or inflammatory changes. No evidence iron deposition. Spleen: Within normal limits in size and appearance. Normal T2 signal intensity, without evidence of iron deposition. Adrenals/Urinary Tract: No masses identified. No evidence of hydronephrosis. Stomach/Bowel: Unremarkable. Vascular/Lymphatic: No pathologically enlarged lymph nodes identified. No acute vascular findings. Other:  None.  Musculoskeletal: No suspicious bone lesions identified. No evidence of excess iron deposition in bone marrow. IMPRESSION: Negative. No radiographic evidence of hemochromatosis or other hepatobiliary disease. Electronically Signed   By: Danae Orleans M.D.   On: 05/13/2022 18:46   US ABDOMEN LIMITED RUQ (LIVER/GB)  Result Date: 05/10/2022 CLINICAL DATA:  Abdominal pain, elevated LFTs EXAM: ULTRASOUND ABDOMEN LIMITED RIGHT UPPER QUADRANT COMPARISON:  None Available. FINDINGS: Gallbladder: No gallstones or wall thickening visualized. No sonographic Murphy sign noted by sonographer. Common bile duct: Diameter: 4 mm Liver: Hyperechoic hepatic parenchyma, suggesting hepatic steatosis. No focal hepatic lesion is seen. Portal vein is patent on color Doppler imaging with normal direction of blood flow towards the liver. Other: None. IMPRESSION: Hepatic steatosis. Electronically Signed   By: Charline Bills M.D.   On: 05/10/2022 23:55    Microbiology: Results for orders placed or performed during the hospital encounter of 05/10/22  Culture, blood (Routine X 2) w Reflex to ID Panel     Status: None   Collection Time: 05/15/22  4:40 AM   Specimen: BLOOD  Result Value Ref Range Status   Specimen Description BLOOD RIGHT [facility]  Final   Special Requests   Final    BOTTLES DRAWN AEROBIC AND ANAEROBIC Blood Culture results may not be optimal due to an excessive volume of blood received in culture bottles   Culture   Final    NO GROWTH 5 DAYS Performed at [facility], [address], [place], [place] [address]    Report Status 05/20/2022 FINAL  Final  Culture, blood (Routine X 2) w Reflex to ID Panel     Status: None   Collection Time: 05/15/22  4:40 AM   Specimen: BLOOD  Result Value Ref Range Status   Specimen Description BLOOD RIGHT HAND  Final  Special Requests   Final    IN PEDIATRIC BOTTLE Blood Culture results may not be optimal due to an inadequate volume of blood received in culture bottles    Culture   Final    NO GROWTH 5 DAYS Performed at [facility], [address] [place], [place] [address]    Report Status 05/20/2022 FINAL  Final    Labs: CBC: Recent Labs  Lab 05/22/22 1505  WBC 4.9  NEUTROABS 2.7  HGB 10.6*  HCT 32.7*  MCV 102.8*  PLT 194   Basic Metabolic Panel: No results for input(s): NA, K, CL, CO2, GLUCOSE, BUN, CREATININE, CALCIUM, MG, PHOS in the last 168 hours.  Liver Function Tests: No results for input(s): AST, ALT, ALKPHOS, BILITOT, PROT, ALBUMIN in the last 168 hours.  CBG: No results for input(s): GLUCAP in the last 168 hours.  Discharge time spent: less than 30 minutes.  Signed: Pennie Banter, DO [facility] 05/24/2022

## 2022-05-16 LAB — EPSTEIN BARR VRS(EBV DNA BY PCR): EBV DNA QN by PCR: NEGATIVE IU/mL

## 2022-05-17 ENCOUNTER — Encounter: Payer: Self-pay | Admitting: Oncology

## 2022-05-17 LAB — EBV AB TO VIRAL CAPSID AG PNL, IGG+IGM
EBV VCA IgG: 68.3 U/mL — ABNORMAL HIGH (ref 0.0–17.9)
EBV VCA IgM: <36 U/mL (ref 0.0–35.9)

## 2022-05-17 LAB — CMV DNA, QUANTITATIVE, PCR
CMV DNA Quant: NEGATIVE IU/mL
Log10 CMV Qn DNA Pl: UNDETERMINED log10 IU/mL

## 2022-05-17 LAB — CMV IGM: CMV IgM: <30 AU/mL (ref 0.0–29.9)

## 2022-05-19 LAB — HSV DNA BY PCR (REFERENCE LAB)
HSV 1 DNA: NEGATIVE
HSV 2 DNA: NEGATIVE

## 2022-05-20 LAB — CULTURE, BLOOD (ROUTINE X 2)
Culture: NO GROWTH
Culture: NO GROWTH

## 2022-05-22 ENCOUNTER — Inpatient Hospital Stay: Payer: 59 | Attending: Oncology

## 2022-05-22 DIAGNOSIS — D696 Thrombocytopenia, unspecified: Secondary | ICD-10-CM | POA: Diagnosis present

## 2022-05-22 LAB — CBC WITH DIFFERENTIAL/PLATELET
Abs Immature Granulocytes: 0.03 10*3/uL (ref 0.00–0.07)
Basophils Absolute: 0 10*3/uL (ref 0.0–0.1)
Basophils Relative: 1 %
Eosinophils Absolute: 0 10*3/uL (ref 0.0–0.5)
Eosinophils Relative: 1 %
HCT: 32.7 % — ABNORMAL LOW (ref 36.0–46.0)
Hemoglobin: 10.6 g/dL — ABNORMAL LOW (ref 12.0–15.0)
Immature Granulocytes: 1 %
Lymphocytes Relative: 32 %
Lymphs Abs: 1.5 10*3/uL (ref 0.7–4.0)
MCH: 33.3 pg (ref 26.0–34.0)
MCHC: 32.4 g/dL (ref 30.0–36.0)
MCV: 102.8 fL — ABNORMAL HIGH (ref 80.0–100.0)
Monocytes Absolute: 0.6 10*3/uL (ref 0.1–1.0)
Monocytes Relative: 12 %
Neutro Abs: 2.7 10*3/uL (ref 1.7–7.7)
Neutrophils Relative %: 53 %
Platelets: 194 10*3/uL (ref 150–400)
RBC: 3.18 MIL/uL — ABNORMAL LOW (ref 3.87–5.11)
RDW: 13.8 % (ref 11.5–15.5)
WBC: 4.9 10*3/uL (ref 4.0–10.5)
nRBC: 0 % (ref 0.0–0.2)

## 2022-05-23 LAB — HEMOCHROMATOSIS DNA-PCR(C282Y,H63D)

## 2022-05-24 ENCOUNTER — Encounter: Payer: Self-pay | Admitting: Family Medicine

## 2022-05-25 ENCOUNTER — Ambulatory Visit: Payer: 59 | Admitting: Oncology

## 2022-05-29 ENCOUNTER — Inpatient Hospital Stay: Payer: 59 | Attending: Oncology

## 2022-05-29 ENCOUNTER — Inpatient Hospital Stay: Payer: 59

## 2022-05-29 DIAGNOSIS — D61818 Other pancytopenia: Secondary | ICD-10-CM | POA: Insufficient documentation

## 2022-05-29 DIAGNOSIS — D696 Thrombocytopenia, unspecified: Secondary | ICD-10-CM

## 2022-05-29 LAB — CBC WITH DIFFERENTIAL/PLATELET
Abs Immature Granulocytes: 0.02 10*3/uL (ref 0.00–0.07)
Basophils Absolute: 0 10*3/uL (ref 0.0–0.1)
Basophils Relative: 0 %
Eosinophils Absolute: 0.1 10*3/uL (ref 0.0–0.5)
Eosinophils Relative: 1 %
HCT: 33.6 % — ABNORMAL LOW (ref 36.0–46.0)
Hemoglobin: 11 g/dL — ABNORMAL LOW (ref 12.0–15.0)
Immature Granulocytes: 0 %
Lymphocytes Relative: 27 %
Lymphs Abs: 1.4 10*3/uL (ref 0.7–4.0)
MCH: 33.6 pg (ref 26.0–34.0)
MCHC: 32.7 g/dL (ref 30.0–36.0)
MCV: 102.8 fL — ABNORMAL HIGH (ref 80.0–100.0)
Monocytes Absolute: 0.4 10*3/uL (ref 0.1–1.0)
Monocytes Relative: 8 %
Neutro Abs: 3.3 10*3/uL (ref 1.7–7.7)
Neutrophils Relative %: 64 %
Platelets: 237 10*3/uL (ref 150–400)
RBC: 3.27 MIL/uL — ABNORMAL LOW (ref 3.87–5.11)
RDW: 13.7 % (ref 11.5–15.5)
WBC: 5.2 10*3/uL (ref 4.0–10.5)
nRBC: 0 % (ref 0.0–0.2)

## 2022-05-29 LAB — COMPREHENSIVE METABOLIC PANEL
ALT: 35 U/L (ref 0–44)
AST: 27 U/L (ref 15–41)
Albumin: 4 g/dL (ref 3.5–5.0)
Alkaline Phosphatase: 104 U/L (ref 38–126)
Anion gap: 7 (ref 5–15)
BUN: 16 mg/dL (ref 6–20)
CO2: 27 mmol/L (ref 22–32)
Calcium: 9.1 mg/dL (ref 8.9–10.3)
Chloride: 104 mmol/L (ref 98–111)
Creatinine, Ser: 0.88 mg/dL (ref 0.44–1.00)
GFR, Estimated: >60 mL/min (ref >60)
Glucose, Bld: 87 mg/dL (ref 70–99)
Potassium: 4.2 mmol/L (ref 3.5–5.1)
Sodium: 138 mmol/L (ref 135–145)
Total Bilirubin: 0.7 mg/dL (ref 0.3–1.2)
Total Protein: 7.5 g/dL (ref 6.5–8.1)

## 2022-06-05 ENCOUNTER — Other Ambulatory Visit: Payer: Self-pay

## 2022-06-05 ENCOUNTER — Inpatient Hospital Stay (HOSPITAL_BASED_OUTPATIENT_CLINIC_OR_DEPARTMENT_OTHER): Payer: 59 | Admitting: Oncology

## 2022-06-05 ENCOUNTER — Inpatient Hospital Stay: Payer: 59

## 2022-06-05 ENCOUNTER — Encounter: Payer: Self-pay | Admitting: Oncology

## 2022-06-05 VITALS — BP 117/88 | HR 80 | Temp 98.9°F | Resp 16 | Wt 154.7 lb

## 2022-06-05 DIAGNOSIS — D61818 Other pancytopenia: Secondary | ICD-10-CM

## 2022-06-05 DIAGNOSIS — D696 Thrombocytopenia, unspecified: Secondary | ICD-10-CM

## 2022-06-05 LAB — CBC WITH DIFFERENTIAL/PLATELET
Abs Immature Granulocytes: 0.02 10*3/uL (ref 0.00–0.07)
Basophils Absolute: 0 10*3/uL (ref 0.0–0.1)
Basophils Relative: 0 %
Eosinophils Absolute: 0.1 10*3/uL (ref 0.0–0.5)
Eosinophils Relative: 1 %
HCT: 31.9 % — ABNORMAL LOW (ref 36.0–46.0)
Hemoglobin: 10.4 g/dL — ABNORMAL LOW (ref 12.0–15.0)
Immature Granulocytes: 0 %
Lymphocytes Relative: 20 %
Lymphs Abs: 1.3 10*3/uL (ref 0.7–4.0)
MCH: 32.9 pg (ref 26.0–34.0)
MCHC: 32.6 g/dL (ref 30.0–36.0)
MCV: 100.9 fL — ABNORMAL HIGH (ref 80.0–100.0)
Monocytes Absolute: 0.5 10*3/uL (ref 0.1–1.0)
Monocytes Relative: 8 %
Neutro Abs: 4.4 10*3/uL (ref 1.7–7.7)
Neutrophils Relative %: 71 %
Platelets: 133 10*3/uL — ABNORMAL LOW (ref 150–400)
RBC: 3.16 MIL/uL — ABNORMAL LOW (ref 3.87–5.11)
RDW: 13.2 % (ref 11.5–15.5)
WBC: 6.3 10*3/uL (ref 4.0–10.5)
nRBC: 0 % (ref 0.0–0.2)

## 2022-06-06 NOTE — Progress Notes (Signed)
Hematology/Oncology Consult note [facility]  Telephone:([phone] [phone]  Patient Care Team: [facility] as PCP - General Anna Like, RN as Registered Nurse Anna Presto, RN (Inactive) as Registered Nurse   Name of the patient: Anna Lucas  [id number]  12-18-1971   Date of visit: 06/06/22  Diagnosis-pancytopenia likely transient secondary to acute viral infection  Macrocytic anemia likely secondary to alcohol use  Chief complaint/ Reason for visit-posthospital discharge follow-up  Heme/Onc history: Patient is a 51 year old female presenting with abnormal LFTs secondary to acute hepatitis the cause of which was unclear.  Patient does have a history of chronic alcohol abuse but the acute liver failure was not attributed to it.  As far as her LFTs go her AST was elevated at 4577 and ALT 1859 on 05/10/2022 and presently at 2036 and 1017 respectively.  Bilirubin has remained stable to mildly increased at 2.6.  Hematology has been consulted for her pancytopenia.  Patient had a normal white cell count of 6.2 with an H&H of 14.7/43.7 with a platelet count of 93 on 05/10/2022.  Her white count has progressively gone down to 1.2 with an H&H of 11.4/34 and a platelet count of 37.  At baseline patient's platelet count fluctuated between 50s to 90s at least since 2016.  White cell count and hemoglobin has historically been normal.    Interval history-patient continues to feel better since her hospital discharge.  However she does drink alcohol about 2-3 times a week.  Reports some mild left upper quadrant abdominal pain  ECOG PS- 1 Pain scale- 0   Review of systems- Review of Systems  Constitutional:  Positive for malaise/fatigue. Negative for chills, fever and weight loss.  HENT:  Negative for congestion, ear discharge and nosebleeds.   Eyes:  Negative for blurred vision.  Respiratory:  Negative for cough, hemoptysis, sputum  production, shortness of breath and wheezing.   Cardiovascular:  Negative for chest pain, palpitations, orthopnea and claudication.  Gastrointestinal:  Positive for abdominal pain. Negative for blood in stool, constipation, diarrhea, heartburn, melena, nausea and vomiting.  Genitourinary:  Negative for dysuria, flank pain, frequency, hematuria and urgency.  Musculoskeletal:  Negative for back pain, joint pain and myalgias.  Skin:  Negative for rash.  Neurological:  Negative for dizziness, tingling, focal weakness, seizures, weakness and headaches.  Endo/Heme/Allergies:  Does not bruise/bleed easily.  Psychiatric/Behavioral:  Negative for depression and suicidal ideas. The patient does not have insomnia.        Allergies  Allergen Reactions   Prednisone     Feet swelling      Past Medical History:  Diagnosis Date   Acid reflux    Alcohol abuse    Anxiety    Depression    Hypertension    Hypomagnesemia 05/12/2022   Hyponatremia 05/12/2022   Hypophosphatemia 05/12/2022   PTSD (post-traumatic stress disorder)      Past Surgical History:  Procedure Laterality Date   CESAREAN SECTION      Social History   Socioeconomic History   Marital status: Single    Spouse name: Not on file   Number of children: Not on file   Years of education: Not on file   Highest education level: Not on file  Occupational History   Not on file  Tobacco Use   Smoking status: Every Day    Packs/day: 1.00    Types: Cigarettes   Smokeless tobacco: Never  Substance and Sexual Activity  Alcohol use: Yes    Comment: twice a week   Drug use: Never   Sexual activity: Not on file  Other Topics Concern   Not on file  Social History Narrative   Not on file   Social Determinants of Health   Financial Resource Strain: Not on file  Food Insecurity: Not on file  Transportation Needs: Not on file  Physical Activity: Not on file  Stress: Not on file  Social Connections: Not on file  Intimate  Partner Violence: Not on file    Family History  Problem Relation Age of Onset   CAD Paternal Grandfather    Stroke Paternal Grandfather    Diabetes Mellitus II Maternal Grandmother    Emphysema Maternal Grandmother      Current Outpatient Medications:    albuterol (PROVENTIL HFA;VENTOLIN HFA) 108 (90 Base) MCG/ACT inhaler, Inhale 2 puffs into the lungs every 6 (six) hours as needed for wheezing or shortness of breath., Disp: 1 Inhaler, Rfl: 0   amLODipine (NORVASC) 5 MG tablet, Take 1 tablet (5 mg total) by mouth daily., Disp: 30 tablet, Rfl: 2   citalopram (CELEXA) 40 MG tablet, Take 40 mg by mouth daily., Disp: , Rfl:    gabapentin (NEURONTIN) 300 MG capsule, Take 300 mg by mouth 3 (three) times daily., Disp: , Rfl:    hydrOXYzine (ATARAX/VISTARIL) 50 MG tablet, Take 50 mg by mouth 3 (three) times daily as needed., Disp: , Rfl:    Multiple Vitamin (MULTIVITAMIN WITH MINERALS) TABS tablet, Take 1 tablet by mouth daily., Disp: , Rfl:    omeprazole (PRILOSEC) 40 MG capsule, Take 40 mg by mouth daily., Disp: , Rfl:    traZODone (DESYREL) 100 MG tablet, Take 100 mg by mouth at bedtime., Disp: , Rfl:   Physical exam:  Vitals:   06/05/22 1301  BP: 117/88  Pulse: 80  Resp: 16  Temp: 98.9 F (37.2 C)  SpO2: 97%  Weight: 154 lb 11.2 oz (70.2 kg)   Physical Exam Constitutional:      General: She is not in acute distress. Cardiovascular:     Rate and Rhythm: Normal rate and regular rhythm.     Heart sounds: Normal heart sounds.  Pulmonary:     Effort: Pulmonary effort is normal.     Breath sounds: Normal breath sounds.  Abdominal:     General: Bowel sounds are normal. There is no distension.     Palpations: Abdomen is soft.     Tenderness: There is no abdominal tenderness.  Skin:    General: Skin is warm and dry.  Neurological:     Mental Status: She is alert and oriented to person, place, and time.         Latest Ref Rng & Units 05/29/2022   10:27 AM  CMP  Glucose 70 -  99 mg/dL 87   BUN 6 - 20 mg/dL 16   Creatinine 0.44 - 1.00 mg/dL 0.88   Sodium 135 - 145 mmol/L 138   Potassium 3.5 - 5.1 mmol/L 4.2   Chloride 98 - 111 mmol/L 104   CO2 22 - 32 mmol/L 27   Calcium 8.9 - 10.3 mg/dL 9.1   Total Protein 6.5 - 8.1 g/dL 7.5   Total Bilirubin 0.3 - 1.2 mg/dL 0.7   Alkaline Phos 38 - 126 U/L 104   AST 15 - 41 U/L 27   ALT 0 - 44 U/L 35       Latest Ref Rng & Units 06/05/2022  12:42 PM  CBC  WBC 4.0 - 10.5 K/uL 6.3   Hemoglobin 12.0 - 15.0 g/dL 40.910.4   Hematocrit 81.136.0 - 46.0 % 31.9   Platelets 150 - 400 K/uL 133     No images are attached to the encounter.  MR LIVER W WO CONTRAST  Result Date: 05/13/2022 CLINICAL DATA:  Cirrhosis. Elevated ferritin. Elevated liver function tests. Alcohol abuse. Suspect hemochromatosis. EXAM: MRI ABDOMEN WITHOUT AND WITH CONTRAST TECHNIQUE: Multiplanar multisequence MR imaging of the abdomen was performed both before and after the administration of intravenous contrast. CONTRAST:  7.285mL GADAVIST GADOBUTROL 1 MMOL/ML IV SOLN COMPARISON:  CT on 05/11/2022 FINDINGS: Lower chest: No acute findings. Hepatobiliary: Normal signal intensity of the hepatic parenchyma, without evidence of iron overload or steatosis. No gross morphologic changes of cirrhosis noted. No hepatic masses identified. Gallbladder is unremarkable. No evidence of biliary ductal dilatation. Pancreas: No mass or inflammatory changes. No evidence iron deposition. Spleen: Within normal limits in size and appearance. Normal T2 signal intensity, without evidence of iron deposition. Adrenals/Urinary Tract: No masses identified. No evidence of hydronephrosis. Stomach/Bowel: Unremarkable. Vascular/Lymphatic: No pathologically enlarged lymph nodes identified. No acute vascular findings. Other:  None. Musculoskeletal: No suspicious bone lesions identified. No evidence of excess iron deposition in bone marrow. IMPRESSION: Negative. No radiographic evidence of hemochromatosis or  other hepatobiliary disease. Electronically Signed   By: Danae OrleansJohn A Stahl M.D.   On: 05/13/2022 18:46   CT ABDOMEN PELVIS W CONTRAST  Result Date: 05/11/2022 CLINICAL DATA:  Abdominal pain, nausea/vomiting EXAM: CT ABDOMEN AND PELVIS WITH CONTRAST TECHNIQUE: Multidetector CT imaging of the abdomen and pelvis was performed using the standard protocol following bolus administration of intravenous contrast. RADIATION DOSE REDUCTION: This exam was performed according to the departmental dose-optimization program which includes automated exposure control, adjustment of the mA and/or kV according to patient size and/or use of iterative reconstruction technique. CONTRAST:  100mL OMNIPAQUE IOHEXOL 300 MG/ML  SOLN COMPARISON:  Right upper quadrant abdominal ultrasound dated 05/10/2022. CT abdomen/pelvis dated 08/17/2016. FINDINGS: Lower chest: Lung bases are clear. Hepatobiliary: Liver is within normal limits. Gallbladder is unremarkable. No intrahepatic or extrahepatic ductal dilatation. Pancreas: Within normal limits. Spleen: Within normal limits. Adrenals/Urinary Tract: Adrenal glands are within normal limits. Kidneys are within normal limits.  No hydronephrosis. Bladder is thick-walled although underdistended. Stomach/Bowel: Stomach is within normal limits. No evidence of bowel obstruction. Normal appendix (series 2/image 62). No colonic wall thickening or inflammatory changes. Vascular/Lymphatic: No evidence of abdominal aortic aneurysm. Atherosclerotic calcifications of the abdominal aorta and branch vessels. No suspicious abdominopelvic lymphadenopathy. Reproductive: Uterus is within normal limits. Bilateral ovaries are within normal limits. Other: No abdominopelvic ascites. Musculoskeletal: Visualized osseous structures are within normal limits. IMPRESSION: No CT findings to account for the patient's abdominal pain. Electronically Signed   By: Charline BillsSriyesh  Krishnan M.D.   On: 05/11/2022 01:51   US ABDOMEN LIMITED RUQ  (LIVER/GB)  Result Date: 05/10/2022 CLINICAL DATA:  Abdominal pain, elevated LFTs EXAM: ULTRASOUND ABDOMEN LIMITED RIGHT UPPER QUADRANT COMPARISON:  None Available. FINDINGS: Gallbladder: No gallstones or wall thickening visualized. No sonographic Murphy sign noted by sonographer. Common bile duct: Diameter: 4 mm Liver: Hyperechoic hepatic parenchyma, suggesting hepatic steatosis. No focal hepatic lesion is seen. Portal vein is patent on color Doppler imaging with normal direction of blood flow towards the liver. Other: None. IMPRESSION: Hepatic steatosis. Electronically Signed   By: Charline BillsSriyesh  Krishnan M.D.   On: 05/10/2022 23:55     Assessment and plan- Patient is a 51 y.o.  female with history of pancytopenia in the setting of acute liver injury possibly viral.  This is a posthospital discharge follow-up  Patient had significant pancytopenia during her hospital stay which has now normalized.  Her white count is 6.3 and her platelets are mildly low at 133.  Hemoglobin is still somewhat low at 10.4 with an MCV of 100.9.  I suspect this is secondary to macrocytic anemia from alcohol use.  She has had a complete anemia work-up including B12 folate TSH and iron studies which have all been unremarkable.  I will continue to monitor her CBC ferritin and iron studies B12 folate and TSH in 3 months and I will see her back in 6 months   Visit Diagnosis 1. Pancytopenia ([place])      Dr. Randa Evens, MD, MPH [facility] [id number] 06/06/2022 1:03 PM

## 2022-06-16 ENCOUNTER — Other Ambulatory Visit: Payer: Self-pay

## 2022-06-16 ENCOUNTER — Emergency Department: Payer: 59

## 2022-06-16 ENCOUNTER — Encounter: Payer: Self-pay | Admitting: Emergency Medicine

## 2022-06-16 ENCOUNTER — Inpatient Hospital Stay
Admission: EM | Admit: 2022-06-16 | Discharge: 2022-06-28 | DRG: 871 | Disposition: A | Discharge status: Home Health | Payer: 59 | Attending: Internal Medicine | Admitting: Internal Medicine

## 2022-06-16 DIAGNOSIS — Z8249 Family history of ischemic heart disease and other diseases of the circulatory system: Secondary | ICD-10-CM

## 2022-06-16 DIAGNOSIS — J189 Pneumonia, unspecified organism: Secondary | ICD-10-CM | POA: Diagnosis present

## 2022-06-16 DIAGNOSIS — D649 Anemia, unspecified: Secondary | ICD-10-CM | POA: Diagnosis present

## 2022-06-16 DIAGNOSIS — R220 Localized swelling, mass and lump, head: Secondary | ICD-10-CM | POA: Diagnosis not present

## 2022-06-16 DIAGNOSIS — S0031XA Abrasion of nose, initial encounter: Secondary | ICD-10-CM | POA: Diagnosis present

## 2022-06-16 DIAGNOSIS — G9341 Metabolic encephalopathy: Secondary | ICD-10-CM | POA: Diagnosis not present

## 2022-06-16 DIAGNOSIS — E876 Hypokalemia: Secondary | ICD-10-CM | POA: Diagnosis present

## 2022-06-16 DIAGNOSIS — W19XXXA Unspecified fall, initial encounter: Secondary | ICD-10-CM

## 2022-06-16 DIAGNOSIS — G629 Polyneuropathy, unspecified: Secondary | ICD-10-CM

## 2022-06-16 DIAGNOSIS — E669 Obesity, unspecified: Secondary | ICD-10-CM | POA: Diagnosis present

## 2022-06-16 DIAGNOSIS — Z825 Family history of asthma and other chronic lower respiratory diseases: Secondary | ICD-10-CM

## 2022-06-16 DIAGNOSIS — Z823 Family history of stroke: Secondary | ICD-10-CM

## 2022-06-16 DIAGNOSIS — F10931 Alcohol use, unspecified with withdrawal delirium: Secondary | ICD-10-CM | POA: Diagnosis not present

## 2022-06-16 DIAGNOSIS — F1010 Alcohol abuse, uncomplicated: Secondary | ICD-10-CM | POA: Diagnosis present

## 2022-06-16 DIAGNOSIS — Z6832 Body mass index (BMI) 32.0-32.9, adult: Secondary | ICD-10-CM

## 2022-06-16 DIAGNOSIS — S0993XD Unspecified injury of face, subsequent encounter: Secondary | ICD-10-CM

## 2022-06-16 DIAGNOSIS — Z23 Encounter for immunization: Secondary | ICD-10-CM

## 2022-06-16 DIAGNOSIS — A419 Sepsis, unspecified organism: Principal | ICD-10-CM

## 2022-06-16 DIAGNOSIS — F10231 Alcohol dependence with withdrawal delirium: Secondary | ICD-10-CM | POA: Diagnosis present

## 2022-06-16 DIAGNOSIS — L03221 Cellulitis of neck: Secondary | ICD-10-CM | POA: Diagnosis present

## 2022-06-16 DIAGNOSIS — A483 Toxic shock syndrome: Secondary | ICD-10-CM | POA: Diagnosis present

## 2022-06-16 DIAGNOSIS — S0083XA Contusion of other part of head, initial encounter: Secondary | ICD-10-CM | POA: Diagnosis present

## 2022-06-16 DIAGNOSIS — K219 Gastro-esophageal reflux disease without esophagitis: Secondary | ICD-10-CM | POA: Diagnosis present

## 2022-06-16 DIAGNOSIS — F419 Anxiety disorder, unspecified: Secondary | ICD-10-CM | POA: Diagnosis present

## 2022-06-16 DIAGNOSIS — F4310 Post-traumatic stress disorder, unspecified: Secondary | ICD-10-CM | POA: Diagnosis present

## 2022-06-16 DIAGNOSIS — W109XXA Fall (on) (from) unspecified stairs and steps, initial encounter: Secondary | ICD-10-CM | POA: Diagnosis present

## 2022-06-16 DIAGNOSIS — D6959 Other secondary thrombocytopenia: Secondary | ICD-10-CM | POA: Diagnosis present

## 2022-06-16 DIAGNOSIS — Z1611 Resistance to penicillins: Secondary | ICD-10-CM | POA: Diagnosis present

## 2022-06-16 DIAGNOSIS — B379 Candidiasis, unspecified: Secondary | ICD-10-CM | POA: Diagnosis not present

## 2022-06-16 DIAGNOSIS — E872 Acidosis, unspecified: Secondary | ICD-10-CM | POA: Diagnosis present

## 2022-06-16 DIAGNOSIS — D7589 Other specified diseases of blood and blood-forming organs: Secondary | ICD-10-CM | POA: Diagnosis present

## 2022-06-16 DIAGNOSIS — Z833 Family history of diabetes mellitus: Secondary | ICD-10-CM

## 2022-06-16 DIAGNOSIS — Z20822 Contact with and (suspected) exposure to covid-19: Secondary | ICD-10-CM | POA: Diagnosis present

## 2022-06-16 DIAGNOSIS — L03211 Cellulitis of face: Secondary | ICD-10-CM | POA: Diagnosis present

## 2022-06-16 DIAGNOSIS — R6520 Severe sepsis without septic shock: Secondary | ICD-10-CM | POA: Diagnosis present

## 2022-06-16 DIAGNOSIS — I10 Essential (primary) hypertension: Secondary | ICD-10-CM | POA: Diagnosis present

## 2022-06-16 DIAGNOSIS — F32A Depression, unspecified: Secondary | ICD-10-CM | POA: Diagnosis present

## 2022-06-16 DIAGNOSIS — R6521 Severe sepsis with septic shock: Secondary | ICD-10-CM | POA: Diagnosis present

## 2022-06-16 DIAGNOSIS — J9601 Acute respiratory failure with hypoxia: Secondary | ICD-10-CM | POA: Diagnosis present

## 2022-06-16 DIAGNOSIS — A400 Sepsis due to streptococcus, group A: Principal | ICD-10-CM | POA: Diagnosis present

## 2022-06-16 DIAGNOSIS — F17210 Nicotine dependence, cigarettes, uncomplicated: Secondary | ICD-10-CM | POA: Diagnosis present

## 2022-06-16 LAB — COMPREHENSIVE METABOLIC PANEL
ALT: 15 U/L (ref 0–44)
AST: 32 U/L (ref 15–41)
Albumin: 3.8 g/dL (ref 3.5–5.0)
Alkaline Phosphatase: 73 U/L (ref 38–126)
Anion gap: 10 (ref 5–15)
BUN: 12 mg/dL (ref 6–20)
CO2: 24 mmol/L (ref 22–32)
Calcium: 9 mg/dL (ref 8.9–10.3)
Chloride: 101 mmol/L (ref 98–111)
Creatinine, Ser: 0.87 mg/dL (ref 0.44–1.00)
GFR, Estimated: >60 mL/min (ref >60)
Glucose, Bld: 118 mg/dL — ABNORMAL HIGH (ref 70–99)
Potassium: 3 mmol/L — ABNORMAL LOW (ref 3.5–5.1)
Sodium: 135 mmol/L (ref 135–145)
Total Bilirubin: 0.7 mg/dL (ref 0.3–1.2)
Total Protein: 7.5 g/dL (ref 6.5–8.1)

## 2022-06-16 LAB — CBC WITH DIFFERENTIAL/PLATELET
Abs Immature Granulocytes: 0.07 10*3/uL (ref 0.00–0.07)
Basophils Absolute: 0 10*3/uL (ref 0.0–0.1)
Basophils Relative: 0 %
Eosinophils Absolute: 0 10*3/uL (ref 0.0–0.5)
Eosinophils Relative: 0 %
HCT: 33.8 % — ABNORMAL LOW (ref 36.0–46.0)
Hemoglobin: 11 g/dL — ABNORMAL LOW (ref 12.0–15.0)
Immature Granulocytes: 1 %
Lymphocytes Relative: 2 %
Lymphs Abs: 0.2 10*3/uL — ABNORMAL LOW (ref 0.7–4.0)
MCH: 32.2 pg (ref 26.0–34.0)
MCHC: 32.5 g/dL (ref 30.0–36.0)
MCV: 98.8 fL (ref 80.0–100.0)
Monocytes Absolute: 0.2 10*3/uL (ref 0.1–1.0)
Monocytes Relative: 1 %
Neutro Abs: 14.6 10*3/uL — ABNORMAL HIGH (ref 1.7–7.7)
Neutrophils Relative %: 96 %
Platelets: 220 10*3/uL (ref 150–400)
RBC: 3.42 MIL/uL — ABNORMAL LOW (ref 3.87–5.11)
RDW: 13.4 % (ref 11.5–15.5)
WBC: 15.1 10*3/uL — ABNORMAL HIGH (ref 4.0–10.5)
nRBC: 0 % (ref 0.0–0.2)

## 2022-06-16 MED ORDER — SODIUM CHLORIDE 0.9 % IV BOLUS
1000.0000 mL | Freq: Once | INTRAVENOUS | Status: AC
  Administered 2022-06-16: 1000 mL via INTRAVENOUS

## 2022-06-16 MED ORDER — HYDROMORPHONE HCL 1 MG/ML IJ SOLN
0.5000 mg | Freq: Once | INTRAMUSCULAR | Status: AC
  Administered 2022-06-16: 0.5 mg via INTRAVENOUS
  Filled 2022-06-16: qty 0.5

## 2022-06-16 MED ORDER — IOHEXOL 350 MG/ML SOLN
75.0000 mL | Freq: Once | INTRAVENOUS | Status: AC | PRN
  Administered 2022-06-16: 75 mL via INTRAVENOUS

## 2022-06-17 ENCOUNTER — Encounter: Payer: Self-pay | Admitting: Internal Medicine

## 2022-06-17 DIAGNOSIS — Z823 Family history of stroke: Secondary | ICD-10-CM | POA: Diagnosis not present

## 2022-06-17 DIAGNOSIS — G9341 Metabolic encephalopathy: Secondary | ICD-10-CM | POA: Diagnosis present

## 2022-06-17 DIAGNOSIS — W109XXA Fall (on) (from) unspecified stairs and steps, initial encounter: Secondary | ICD-10-CM | POA: Diagnosis present

## 2022-06-17 DIAGNOSIS — A400 Sepsis due to streptococcus, group A: Secondary | ICD-10-CM | POA: Diagnosis present

## 2022-06-17 DIAGNOSIS — A483 Toxic shock syndrome: Secondary | ICD-10-CM | POA: Diagnosis present

## 2022-06-17 DIAGNOSIS — L03211 Cellulitis of face: Secondary | ICD-10-CM | POA: Diagnosis present

## 2022-06-17 DIAGNOSIS — F10931 Alcohol use, unspecified with withdrawal delirium: Secondary | ICD-10-CM | POA: Diagnosis not present

## 2022-06-17 DIAGNOSIS — D6959 Other secondary thrombocytopenia: Secondary | ICD-10-CM | POA: Diagnosis present

## 2022-06-17 DIAGNOSIS — W19XXXA Unspecified fall, initial encounter: Secondary | ICD-10-CM | POA: Diagnosis not present

## 2022-06-17 DIAGNOSIS — Z1611 Resistance to penicillins: Secondary | ICD-10-CM | POA: Diagnosis present

## 2022-06-17 DIAGNOSIS — R6520 Severe sepsis without septic shock: Secondary | ICD-10-CM | POA: Diagnosis not present

## 2022-06-17 DIAGNOSIS — D649 Anemia, unspecified: Secondary | ICD-10-CM | POA: Diagnosis present

## 2022-06-17 DIAGNOSIS — F32A Depression, unspecified: Secondary | ICD-10-CM | POA: Diagnosis present

## 2022-06-17 DIAGNOSIS — R220 Localized swelling, mass and lump, head: Secondary | ICD-10-CM | POA: Insufficient documentation

## 2022-06-17 DIAGNOSIS — J189 Pneumonia, unspecified organism: Secondary | ICD-10-CM | POA: Diagnosis present

## 2022-06-17 DIAGNOSIS — L03221 Cellulitis of neck: Secondary | ICD-10-CM | POA: Diagnosis present

## 2022-06-17 DIAGNOSIS — A419 Sepsis, unspecified organism: Secondary | ICD-10-CM | POA: Diagnosis present

## 2022-06-17 DIAGNOSIS — A491 Streptococcal infection, unspecified site: Secondary | ICD-10-CM | POA: Diagnosis not present

## 2022-06-17 DIAGNOSIS — B955 Unspecified streptococcus as the cause of diseases classified elsewhere: Secondary | ICD-10-CM | POA: Diagnosis not present

## 2022-06-17 DIAGNOSIS — J9601 Acute respiratory failure with hypoxia: Secondary | ICD-10-CM | POA: Diagnosis present

## 2022-06-17 DIAGNOSIS — Z23 Encounter for immunization: Secondary | ICD-10-CM | POA: Diagnosis not present

## 2022-06-17 DIAGNOSIS — Z8249 Family history of ischemic heart disease and other diseases of the circulatory system: Secondary | ICD-10-CM | POA: Diagnosis not present

## 2022-06-17 DIAGNOSIS — Z20822 Contact with and (suspected) exposure to covid-19: Secondary | ICD-10-CM | POA: Diagnosis present

## 2022-06-17 DIAGNOSIS — E669 Obesity, unspecified: Secondary | ICD-10-CM | POA: Diagnosis present

## 2022-06-17 DIAGNOSIS — F4310 Post-traumatic stress disorder, unspecified: Secondary | ICD-10-CM | POA: Diagnosis present

## 2022-06-17 DIAGNOSIS — I10 Essential (primary) hypertension: Secondary | ICD-10-CM | POA: Diagnosis present

## 2022-06-17 DIAGNOSIS — R6521 Severe sepsis with septic shock: Secondary | ICD-10-CM | POA: Diagnosis present

## 2022-06-17 DIAGNOSIS — E876 Hypokalemia: Secondary | ICD-10-CM | POA: Diagnosis present

## 2022-06-17 DIAGNOSIS — E872 Acidosis, unspecified: Secondary | ICD-10-CM | POA: Diagnosis present

## 2022-06-17 DIAGNOSIS — Z825 Family history of asthma and other chronic lower respiratory diseases: Secondary | ICD-10-CM | POA: Diagnosis not present

## 2022-06-17 DIAGNOSIS — R7881 Bacteremia: Secondary | ICD-10-CM | POA: Diagnosis not present

## 2022-06-17 DIAGNOSIS — F10231 Alcohol dependence with withdrawal delirium: Secondary | ICD-10-CM | POA: Diagnosis present

## 2022-06-17 DIAGNOSIS — F1010 Alcohol abuse, uncomplicated: Secondary | ICD-10-CM | POA: Diagnosis not present

## 2022-06-17 LAB — CBC WITH DIFFERENTIAL/PLATELET
Abs Immature Granulocytes: 0.17 10*3/uL — ABNORMAL HIGH (ref 0.00–0.07)
Basophils Absolute: 0 10*3/uL (ref 0.0–0.1)
Basophils Relative: 0 %
Eosinophils Absolute: 0 10*3/uL (ref 0.0–0.5)
Eosinophils Relative: 0 %
HCT: 29.8 % — ABNORMAL LOW (ref 36.0–46.0)
Hemoglobin: 9.5 g/dL — ABNORMAL LOW (ref 12.0–15.0)
Immature Granulocytes: 1 %
Lymphocytes Relative: 1 %
Lymphs Abs: 0.1 10*3/uL — ABNORMAL LOW (ref 0.7–4.0)
MCH: 32.5 pg (ref 26.0–34.0)
MCHC: 31.9 g/dL (ref 30.0–36.0)
MCV: 102.1 fL — ABNORMAL HIGH (ref 80.0–100.0)
Monocytes Absolute: 0.2 10*3/uL (ref 0.1–1.0)
Monocytes Relative: 1 %
Neutro Abs: 12.5 10*3/uL — ABNORMAL HIGH (ref 1.7–7.7)
Neutrophils Relative %: 97 %
Platelets: 165 10*3/uL (ref 150–400)
RBC: 2.92 MIL/uL — ABNORMAL LOW (ref 3.87–5.11)
RDW: 13.5 % (ref 11.5–15.5)
Smear Review: NORMAL
WBC: 13 10*3/uL — ABNORMAL HIGH (ref 4.0–10.5)
nRBC: 0 % (ref 0.0–0.2)

## 2022-06-17 LAB — BLOOD CULTURE ID PANEL (REFLEXED) - BCID2

## 2022-06-17 LAB — URINALYSIS, COMPLETE (UACMP) WITH MICROSCOPIC
Bacteria, UA: NONE SEEN
Bilirubin Urine: NEGATIVE
Glucose, UA: NEGATIVE mg/dL
Hgb urine dipstick: NEGATIVE
Ketones, ur: NEGATIVE mg/dL
Leukocytes,Ua: NEGATIVE
Nitrite: NEGATIVE
Protein, ur: NEGATIVE mg/dL
Specific Gravity, Urine: >1.046 — ABNORMAL HIGH (ref 1.005–1.030)
pH: 5 (ref 5.0–8.0)

## 2022-06-17 LAB — HEPATIC FUNCTION PANEL
ALT: 12 U/L (ref 0–44)
AST: 22 U/L (ref 15–41)
Albumin: 3 g/dL — ABNORMAL LOW (ref 3.5–5.0)
Alkaline Phosphatase: 49 U/L (ref 38–126)
Bilirubin, Direct: 0.2 mg/dL (ref 0.0–0.2)
Indirect Bilirubin: 0.1 mg/dL — ABNORMAL LOW (ref 0.3–0.9)
Total Bilirubin: 0.3 mg/dL (ref 0.3–1.2)
Total Protein: 6 g/dL — ABNORMAL LOW (ref 6.5–8.1)

## 2022-06-17 LAB — LACTIC ACID, PLASMA
Lactic Acid, Venous: 1.4 mmol/L (ref 0.5–1.9)
Lactic Acid, Venous: 1.8 mmol/L (ref 0.5–1.9)
Lactic Acid, Venous: 2.1 mmol/L (ref 0.5–1.9)

## 2022-06-17 LAB — HIV ANTIBODY (ROUTINE TESTING W REFLEX): HIV Screen 4th Generation wRfx: NONREACTIVE

## 2022-06-17 LAB — PHOSPHORUS: Phosphorus: 2.9 mg/dL (ref 2.5–4.6)

## 2022-06-17 LAB — SARS CORONAVIRUS 2 BY RT PCR: SARS Coronavirus 2 by RT PCR: NEGATIVE

## 2022-06-17 LAB — BASIC METABOLIC PANEL
Anion gap: 8 (ref 5–15)
BUN: 14 mg/dL (ref 6–20)
CO2: 23 mmol/L (ref 22–32)
Calcium: 7.6 mg/dL — ABNORMAL LOW (ref 8.9–10.3)
Chloride: 104 mmol/L (ref 98–111)
Creatinine, Ser: 0.8 mg/dL (ref 0.44–1.00)
GFR, Estimated: >60 mL/min (ref >60)
Glucose, Bld: 106 mg/dL — ABNORMAL HIGH (ref 70–99)
Potassium: 2.7 mmol/L — CL (ref 3.5–5.1)
Sodium: 135 mmol/L (ref 135–145)

## 2022-06-17 LAB — MAGNESIUM: Magnesium: 0.8 mg/dL — CL (ref 1.7–2.4)

## 2022-06-17 LAB — PROCALCITONIN: Procalcitonin: 3.7 ng/mL

## 2022-06-17 MED ORDER — ADULT MULTIVITAMIN W/MINERALS CH
1.0000 | ORAL_TABLET | Freq: Every day | ORAL | Status: DC
  Administered 2022-06-17 – 2022-06-18 (×2): 1 via ORAL
  Filled 2022-06-17 (×2): qty 1

## 2022-06-17 MED ORDER — ALBUTEROL SULFATE (2.5 MG/3ML) 0.083% IN NEBU: 2.5000 mg | INHALATION_SOLUTION | Freq: Four times a day (QID) | RESPIRATORY_TRACT | Status: DC | PRN

## 2022-06-17 MED ORDER — GABAPENTIN 300 MG PO CAPS
300.0000 mg | ORAL_CAPSULE | Freq: Every day | ORAL | Status: DC
  Administered 2022-06-17 – 2022-06-18 (×2): 300 mg via ORAL
  Filled 2022-06-17 (×2): qty 1

## 2022-06-17 MED ORDER — SODIUM CHLORIDE 0.9 % IV BOLUS
500.0000 mL | Freq: Once | INTRAVENOUS | Status: AC
Start: 2022-06-17 — End: 2022-06-17
  Administered 2022-06-17: 500 mL via INTRAVENOUS

## 2022-06-17 MED ORDER — POTASSIUM CHLORIDE IN NACL 40-0.9 MEQ/L-% IV SOLN
INTRAVENOUS | Status: DC
  Filled 2022-06-17 (×4): qty 1000

## 2022-06-17 MED ORDER — ACETAMINOPHEN 650 MG RE SUPP: 650.0000 mg | Freq: Four times a day (QID) | RECTAL | Status: DC | PRN

## 2022-06-17 MED ORDER — SODIUM CHLORIDE 0.9 % IV SOLN
2.0000 g | INTRAVENOUS | Status: DC
  Administered 2022-06-17: 2 g via INTRAVENOUS
  Filled 2022-06-17: qty 20

## 2022-06-17 MED ORDER — SODIUM CHLORIDE 0.9 % IV BOLUS
1000.0000 mL | Freq: Once | INTRAVENOUS | Status: AC
  Administered 2022-06-17: 1000 mL via INTRAVENOUS

## 2022-06-17 MED ORDER — THIAMINE HCL 100 MG PO TABS
100.0000 mg | ORAL_TABLET | Freq: Every day | ORAL | Status: DC
Start: 2022-06-17 — End: 2022-06-18
  Administered 2022-06-17: 100 mg via ORAL
  Filled 2022-06-17: qty 1

## 2022-06-17 MED ORDER — THIAMINE HCL 100 MG/ML IJ SOLN
100.0000 mg | Freq: Every day | INTRAMUSCULAR | Status: DC
  Administered 2022-06-18: 100 mg via INTRAVENOUS
  Filled 2022-06-17: qty 2

## 2022-06-17 MED ORDER — KETOROLAC TROMETHAMINE 30 MG/ML IJ SOLN
30.0000 mg | Freq: Four times a day (QID) | INTRAMUSCULAR | Status: DC | PRN
Start: 2022-06-17 — End: 2022-06-18
  Administered 2022-06-17 (×2): 30 mg via INTRAVENOUS
  Filled 2022-06-17 (×2): qty 1

## 2022-06-17 MED ORDER — LORAZEPAM 1 MG PO TABS
1.0000 mg | ORAL_TABLET | ORAL | Status: AC | PRN
  Administered 2022-06-17 – 2022-06-18 (×4): 2 mg via ORAL
  Filled 2022-06-17 (×5): qty 2

## 2022-06-17 MED ORDER — ACETAMINOPHEN 325 MG PO TABS
650.0000 mg | ORAL_TABLET | Freq: Four times a day (QID) | ORAL | Status: DC | PRN
  Administered 2022-06-17 – 2022-06-18 (×3): 650 mg via ORAL
  Filled 2022-06-17 (×2): qty 2

## 2022-06-17 MED ORDER — CITALOPRAM HYDROBROMIDE 20 MG PO TABS
40.0000 mg | ORAL_TABLET | Freq: Every day | ORAL | Status: DC
  Administered 2022-06-17 – 2022-06-18 (×2): 40 mg via ORAL
  Filled 2022-06-17 (×2): qty 2

## 2022-06-17 MED ORDER — SODIUM CHLORIDE 0.9 % IV SOLN
500.0000 mg | INTRAVENOUS | Status: DC
  Administered 2022-06-17 – 2022-06-18 (×2): 500 mg via INTRAVENOUS
  Filled 2022-06-17 (×2): qty 5

## 2022-06-17 MED ORDER — SODIUM CHLORIDE 0.9 % IV SOLN: INTRAVENOUS | Status: DC

## 2022-06-17 MED ORDER — FOLIC ACID 1 MG PO TABS
1.0000 mg | ORAL_TABLET | Freq: Every day | ORAL | Status: DC
  Administered 2022-06-17 – 2022-06-18 (×2): 1 mg via ORAL
  Filled 2022-06-17 (×2): qty 1

## 2022-06-17 MED ORDER — LOPERAMIDE HCL 2 MG PO CAPS
4.0000 mg | ORAL_CAPSULE | Freq: Once | ORAL | Status: AC
  Administered 2022-06-17: 4 mg via ORAL
  Filled 2022-06-17: qty 2

## 2022-06-17 MED ORDER — HYDROMORPHONE HCL 1 MG/ML IJ SOLN
0.5000 mg | Freq: Once | INTRAMUSCULAR | Status: AC
  Administered 2022-06-17: 0.5 mg via INTRAVENOUS
  Filled 2022-06-17: qty 0.5

## 2022-06-17 MED ORDER — LORAZEPAM 2 MG/ML IJ SOLN
1.0000 mg | INTRAMUSCULAR | Status: AC | PRN
  Administered 2022-06-18 (×2): 4 mg via INTRAVENOUS
  Administered 2022-06-18: 2 mg via INTRAVENOUS
  Administered 2022-06-18: 1 mg via INTRAVENOUS
  Administered 2022-06-18: 4 mg via INTRAVENOUS
  Administered 2022-06-18: 3 mg via INTRAVENOUS
  Administered 2022-06-19: 4 mg via INTRAVENOUS
  Filled 2022-06-17: qty 1
  Filled 2022-06-17 (×3): qty 2
  Filled 2022-06-17: qty 1
  Filled 2022-06-17 (×3): qty 2

## 2022-06-17 MED ORDER — PENICILLIN G POTASSIUM 20000000 UNITS IJ SOLR
4.0000 10*6.[IU] | INTRAMUSCULAR | Status: DC
  Administered 2022-06-17 – 2022-06-18 (×4): 4 10*6.[IU] via INTRAVENOUS
  Filled 2022-06-17 (×7): qty 4

## 2022-06-17 MED ORDER — ACETAMINOPHEN 500 MG PO TABS
1000.0000 mg | ORAL_TABLET | Freq: Once | ORAL | Status: AC
  Administered 2022-06-17: 1000 mg via ORAL
  Filled 2022-06-17: qty 2

## 2022-06-17 MED ORDER — SODIUM CHLORIDE 0.9 % IV BOLUS
500.0000 mL | Freq: Once | INTRAVENOUS | Status: AC
  Administered 2022-06-17: 500 mL via INTRAVENOUS

## 2022-06-17 MED ORDER — MAGNESIUM SULFATE 4 GM/100ML IV SOLN
4.0000 g | Freq: Once | INTRAVENOUS | Status: AC
  Administered 2022-06-17: 4 g via INTRAVENOUS
  Filled 2022-06-17: qty 100

## 2022-06-17 MED ORDER — PANTOPRAZOLE SODIUM 40 MG PO TBEC
40.0000 mg | DELAYED_RELEASE_TABLET | Freq: Every day | ORAL | Status: DC
  Administered 2022-06-17 – 2022-06-18 (×2): 40 mg via ORAL
  Filled 2022-06-17 (×2): qty 1

## 2022-06-17 NOTE — Progress Notes (Addendum)
ID Brief Note    Alerted for blood cx 06/17/22 + for strep pyogenes 1/4 bottles  Chart reviewed. Patient not seen.   51 Y O Female admitted with Large anterior scalp and facial hematoma and oedema due to trauma/Fall  Patient has already been appropriately transitioned from ceftriaxone to Pen G continuous infusion  Repeat 2 sets of blood cultures ( ordered )  Secure chatted with DR Myriam Forehand  Dr Shela Commons back tomorrow. Please call with questions.  Odette Fraction, MD Infectious Disease Physician [facility] [address] [place], [place] [address] Phone: [phone]  Fax: [phone]

## 2022-06-18 ENCOUNTER — Inpatient Hospital Stay: Payer: 59

## 2022-06-18 DIAGNOSIS — A483 Toxic shock syndrome: Secondary | ICD-10-CM | POA: Diagnosis not present

## 2022-06-18 DIAGNOSIS — B955 Unspecified streptococcus as the cause of diseases classified elsewhere: Secondary | ICD-10-CM

## 2022-06-18 DIAGNOSIS — W19XXXA Unspecified fall, initial encounter: Secondary | ICD-10-CM | POA: Diagnosis not present

## 2022-06-18 DIAGNOSIS — A400 Sepsis due to streptococcus, group A: Secondary | ICD-10-CM | POA: Diagnosis not present

## 2022-06-18 DIAGNOSIS — A419 Sepsis, unspecified organism: Secondary | ICD-10-CM | POA: Diagnosis not present

## 2022-06-18 DIAGNOSIS — J189 Pneumonia, unspecified organism: Secondary | ICD-10-CM | POA: Diagnosis not present

## 2022-06-18 DIAGNOSIS — S0993XD Unspecified injury of face, subsequent encounter: Secondary | ICD-10-CM

## 2022-06-18 DIAGNOSIS — R6520 Severe sepsis without septic shock: Secondary | ICD-10-CM | POA: Diagnosis not present

## 2022-06-18 DIAGNOSIS — R220 Localized swelling, mass and lump, head: Secondary | ICD-10-CM | POA: Diagnosis not present

## 2022-06-18 LAB — PROCALCITONIN: Procalcitonin: 23.31 ng/mL

## 2022-06-18 LAB — CBC
HCT: 28.5 % — ABNORMAL LOW (ref 36.0–46.0)
Hemoglobin: 9 g/dL — ABNORMAL LOW (ref 12.0–15.0)
MCH: 31.8 pg (ref 26.0–34.0)
MCHC: 31.6 g/dL (ref 30.0–36.0)
MCV: 100.7 fL — ABNORMAL HIGH (ref 80.0–100.0)
Platelets: 135 10*3/uL — ABNORMAL LOW (ref 150–400)
RBC: 2.83 MIL/uL — ABNORMAL LOW (ref 3.87–5.11)
RDW: 13.4 % (ref 11.5–15.5)
WBC: 16.9 10*3/uL — ABNORMAL HIGH (ref 4.0–10.5)
nRBC: 0 % (ref 0.0–0.2)

## 2022-06-18 LAB — PHOSPHORUS: Phosphorus: 1.8 mg/dL — ABNORMAL LOW (ref 2.5–4.6)

## 2022-06-18 LAB — MRSA NEXT GEN BY PCR, NASAL: MRSA by PCR Next Gen: NOT DETECTED

## 2022-06-18 LAB — URINE CULTURE

## 2022-06-18 LAB — BASIC METABOLIC PANEL
Anion gap: 6 (ref 5–15)
BUN: 14 mg/dL (ref 6–20)
CO2: 20 mmol/L — ABNORMAL LOW (ref 22–32)
Calcium: 7.6 mg/dL — ABNORMAL LOW (ref 8.9–10.3)
Chloride: 106 mmol/L (ref 98–111)
Creatinine, Ser: 0.77 mg/dL (ref 0.44–1.00)
GFR, Estimated: >60 mL/min (ref >60)
Glucose, Bld: 91 mg/dL (ref 70–99)
Potassium: 3.6 mmol/L (ref 3.5–5.1)
Sodium: 132 mmol/L — ABNORMAL LOW (ref 135–145)

## 2022-06-18 LAB — LACTIC ACID, PLASMA: Lactic Acid, Venous: 1.5 mmol/L (ref 0.5–1.9)

## 2022-06-18 LAB — MAGNESIUM: Magnesium: 1.9 mg/dL (ref 1.7–2.4)

## 2022-06-18 LAB — GLUCOSE, CAPILLARY
Glucose-Capillary: 118 mg/dL — ABNORMAL HIGH (ref 70–99)
Glucose-Capillary: 128 mg/dL — ABNORMAL HIGH (ref 70–99)
Glucose-Capillary: 84 mg/dL (ref 70–99)

## 2022-06-18 MED ORDER — MIDAZOLAM HCL 2 MG/2ML IJ SOLN
INTRAMUSCULAR | Status: AC
  Administered 2022-06-18: 4 mg via INTRAVENOUS
  Filled 2022-06-18: qty 4

## 2022-06-18 MED ORDER — FENTANYL CITRATE (PF) 100 MCG/2ML IJ SOLN
INTRAMUSCULAR | Status: AC
  Administered 2022-06-18: 200 ug via INTRAVENOUS
  Filled 2022-06-18: qty 4

## 2022-06-18 MED ORDER — ADULT MULTIVITAMIN W/MINERALS CH
1.0000 | ORAL_TABLET | Freq: Every day | ORAL | Status: DC
  Administered 2022-06-19 – 2022-06-21 (×3): 1
  Filled 2022-06-18 (×3): qty 1

## 2022-06-18 MED ORDER — THIAMINE HCL 100 MG/ML IJ SOLN
100.0000 mg | Freq: Every day | INTRAMUSCULAR | Status: DC
  Administered 2022-06-20 – 2022-06-21 (×2): 100 mg via INTRAVENOUS
  Filled 2022-06-18 (×2): qty 2

## 2022-06-18 MED ORDER — METHYLPREDNISOLONE SODIUM SUCC 40 MG IJ SOLR
40.0000 mg | Freq: Two times a day (BID) | INTRAMUSCULAR | Status: DC
  Administered 2022-06-18 – 2022-06-21 (×7): 40 mg via INTRAVENOUS
  Filled 2022-06-18 (×7): qty 1

## 2022-06-18 MED ORDER — ACETAMINOPHEN 650 MG RE SUPP: 650.0000 mg | Freq: Four times a day (QID) | RECTAL | Status: DC | PRN

## 2022-06-18 MED ORDER — VECURONIUM BROMIDE 10 MG IV SOLR
INTRAVENOUS | Status: AC
  Filled 2022-06-18: qty 10

## 2022-06-18 MED ORDER — DEXTROSE 5 % IV SOLN
30.0000 mmol | Freq: Once | INTRAVENOUS | Status: AC
  Administered 2022-06-18: 30 mmol via INTRAVENOUS
  Filled 2022-06-18: qty 10

## 2022-06-18 MED ORDER — NOREPINEPHRINE 4 MG/250ML-% IV SOLN: 0.0000 ug/min | INTRAVENOUS | Status: DC

## 2022-06-18 MED ORDER — POLYETHYLENE GLYCOL 3350 17 G PO PACK
17.0000 g | PACK | Freq: Every day | ORAL | Status: DC
  Administered 2022-06-20 – 2022-06-21 (×2): 17 g
  Filled 2022-06-18 (×2): qty 1

## 2022-06-18 MED ORDER — CHLORHEXIDINE GLUCONATE CLOTH 2 % EX PADS
6.0000 | MEDICATED_PAD | Freq: Every day | CUTANEOUS | Status: DC
  Administered 2022-06-18 – 2022-06-27 (×8): 6 via TOPICAL

## 2022-06-18 MED ORDER — FENTANYL BOLUS VIA INFUSION
50.0000 ug | INTRAVENOUS | Status: DC | PRN
  Administered 2022-06-18 – 2022-06-21 (×7): 100 ug via INTRAVENOUS

## 2022-06-18 MED ORDER — SODIUM CHLORIDE 0.9 % IV SOLN: INTRAVENOUS | Status: DC

## 2022-06-18 MED ORDER — ACETAMINOPHEN 325 MG PO TABS
650.0000 mg | ORAL_TABLET | Freq: Four times a day (QID) | ORAL | Status: DC | PRN
Start: 2022-06-18 — End: 2022-06-21

## 2022-06-18 MED ORDER — GABAPENTIN 300 MG PO CAPS
300.0000 mg | ORAL_CAPSULE | Freq: Every day | ORAL | Status: DC
  Administered 2022-06-19 – 2022-06-20 (×2): 300 mg
  Filled 2022-06-18 (×2): qty 1

## 2022-06-18 MED ORDER — MIDAZOLAM HCL 2 MG/2ML IJ SOLN
4.0000 mg | Freq: Once | INTRAMUSCULAR | Status: AC
Start: 2022-06-18 — End: 2022-06-18

## 2022-06-18 MED ORDER — EPINEPHRINE 1 MG/10ML IJ SOSY
PREFILLED_SYRINGE | INTRAMUSCULAR | Status: AC
  Filled 2022-06-18: qty 10

## 2022-06-18 MED ORDER — FOLIC ACID 1 MG PO TABS
1.0000 mg | ORAL_TABLET | Freq: Every day | ORAL | Status: DC
  Administered 2022-06-19 – 2022-06-21 (×3): 1 mg
  Filled 2022-06-18 (×3): qty 1

## 2022-06-18 MED ORDER — PIPERACILLIN-TAZOBACTAM 3.375 G IVPB
3.3750 g | Freq: Three times a day (TID) | INTRAVENOUS | Status: AC
  Administered 2022-06-18 – 2022-06-19 (×5): 3.375 g via INTRAVENOUS
  Filled 2022-06-18 (×5): qty 50

## 2022-06-18 MED ORDER — LINEZOLID 600 MG/300ML IV SOLN
600.0000 mg | Freq: Two times a day (BID) | INTRAVENOUS | Status: AC
  Administered 2022-06-18 – 2022-06-21 (×7): 600 mg via INTRAVENOUS
  Filled 2022-06-18 (×7): qty 300

## 2022-06-18 MED ORDER — FENTANYL 2500MCG IN NS 250ML (10MCG/ML) PREMIX INFUSION
50.0000 ug/h | INTRAVENOUS | Status: DC
  Administered 2022-06-18: 50 ug/h via INTRAVENOUS
  Administered 2022-06-19 – 2022-06-21 (×5): 200 ug/h via INTRAVENOUS
  Filled 2022-06-18 (×6): qty 250

## 2022-06-18 MED ORDER — IMMUNE GLOBULIN (HUMAN) 10 GM/100ML IV SOLN
1.0000 g/kg | Freq: Once | INTRAVENOUS | Status: AC
  Administered 2022-06-18: 65 g via INTRAVENOUS
  Filled 2022-06-18: qty 650

## 2022-06-18 MED ORDER — MIDAZOLAM HCL 2 MG/2ML IJ SOLN
2.0000 mg | Freq: Once | INTRAMUSCULAR | Status: AC | PRN
  Administered 2022-06-18: 2 mg via INTRAVENOUS
  Filled 2022-06-18: qty 4

## 2022-06-18 MED ORDER — PANTOPRAZOLE 2 MG/ML SUSPENSION
40.0000 mg | Freq: Every day | ORAL | Status: DC
  Administered 2022-06-19 – 2022-06-21 (×3): 40 mg
  Filled 2022-06-18 (×3): qty 20

## 2022-06-18 MED ORDER — VECURONIUM BROMIDE 10 MG IV SOLR: 10.0000 mg | Freq: Once | INTRAVENOUS | Status: AC

## 2022-06-18 MED ORDER — FENTANYL CITRATE (PF) 100 MCG/2ML IJ SOLN: 200.0000 ug | Freq: Once | INTRAMUSCULAR | Status: AC

## 2022-06-18 MED ORDER — DEXMEDETOMIDINE HCL IN NACL 400 MCG/100ML IV SOLN
0.4000 ug/kg/h | INTRAVENOUS | Status: DC
  Administered 2022-06-18 – 2022-06-19 (×3): 0.4 ug/kg/h via INTRAVENOUS
  Filled 2022-06-18 (×3): qty 100

## 2022-06-18 MED ORDER — DOCUSATE SODIUM 50 MG/5ML PO LIQD
100.0000 mg | Freq: Two times a day (BID) | ORAL | Status: DC
  Administered 2022-06-19 – 2022-06-21 (×4): 100 mg
  Filled 2022-06-18 (×4): qty 10

## 2022-06-18 MED ORDER — CITALOPRAM HYDROBROMIDE 20 MG PO TABS
40.0000 mg | ORAL_TABLET | Freq: Every day | ORAL | Status: DC
Start: 2022-06-19 — End: 2022-06-21
  Administered 2022-06-19 – 2022-06-21 (×3): 40 mg
  Filled 2022-06-18 (×3): qty 2

## 2022-06-18 MED ORDER — THIAMINE HCL 100 MG PO TABS
100.0000 mg | ORAL_TABLET | Freq: Every day | ORAL | Status: DC
  Administered 2022-06-19: 100 mg
  Filled 2022-06-18: qty 1

## 2022-06-18 MED ORDER — VECURONIUM BROMIDE 10 MG IV SOLR
INTRAVENOUS | Status: AC
  Administered 2022-06-18: 10 mg via INTRAVENOUS
  Filled 2022-06-18: qty 10
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
## 2022-06-18 NOTE — Plan of Care (Signed)

## 2022-06-18 NOTE — Progress Notes (Signed)
Progress Note    Anna Lucas  YNW:[id number] DOB: 02/07/1971  DOA: 06/16/2022 PCP: [facility]      Brief Narrative:    Medical records reviewed and are as summarized below:  Anna Lucas is a 51 y.o. female with medical history significant for hypertension, neuropathy, alcohol use disorder, anxiety, PTSD, depression, multilevel degenerative changes of the cervical spine, recent hospitalization from 05/10/2022 through 05/15/2022 for pancytopenia, acute hepatitis of unknown etiology. She presented to the hospital because of increasing facial pain.  She had a fall a day prior to admission and she was taken to [facility] where she was found to have left occipital condylar fracture.  Cervical collar was recommended and facial laceration on the right forehead was sutured.  She was found to have severe sepsis secondary to left lower lobe pneumonia and strep pyogenes bacteremia.  She was treated with IV fluids and empiric IV antibiotics.  She had hypokalemia and hypomagnesemia that were repleted.     Assessment/Plan:   Principal Problem:   Severe sepsis (HCC) Active Problems:   Peripheral neuropathy   Hypomagnesemia   Myelosuppression   CAP (community acquired pneumonia)   Sepsis due to Streptococcus pyogenes (HCC)   Hypokalemia   Body mass index is 29.42 kg/m.  Severe sepsis secondary to left lower lobe pneumonia, strep pyogenes bacteremia: Continue IV penicillin.  Discontinue azithromycin  Alcohol withdrawal syndrome: She is requiring frequent doses of Ativan.  She will be transferred to stepdown unit for Precedex drip for adequate sedation.  Case discussed with Dr. Belia Heman, intensivist.  He has been consulted to assist with management.  Hypotension: BP is better.  Continue IV fluids since she is unable to eat at this time.  Right frontal scalp hematoma, right frontal right scalp laceration, facial edema pain secondary to trauma: He was evaluated by  the ophthalmologist and conservative management with ice packs was recommended.     Left occipital condylar fracture, comminuted, angulated fracture of left fifth metacarpal neck : Cervical collar was recommended by [facility] orthopedic surgeon.  Analgesics as needed for pain.  Outpatient follow-up with orthopedic surgeon 6 weeks (from date of visit on 06/16/2022 at [facility]).  S/p fall prior to admission: PT and OT when able  Hypokalemia and hypomagnesemia: Improved  Hypophosphatemia: Treat with IV potassium phosphate.     Diet Order             Diet Heart Room service appropriate? Yes; Fluid consistency: Thin  Diet effective now                            Consultants: Ophthalmologist, intensivist  Procedures: None    Medications:    Chlorhexidine Gluconate Cloth  6 each Topical Daily   citalopram  40 mg Oral Daily   folic acid  1 mg Oral Daily   gabapentin  300 mg Oral Q1200   multivitamin with minerals  1 tablet Oral Daily   pantoprazole  40 mg Oral Daily   thiamine  100 mg Oral Daily   Or   thiamine  100 mg Intravenous Daily   Continuous Infusions:  sodium chloride 100 mL/hr at 06/18/22 1319   dexmedetomidine (PRECEDEX) IV infusion 0.4 mcg/kg/hr (06/18/22 1307)   piperacillin-tazobactam (ZOSYN)  IV 3.375 g (06/18/22 1323)   potassium PHOSPHATE IVPB (in mmol) 30 mmol (06/18/22 1105)     Anti-infectives (From admission, onward)    Start  Dose/Rate Route Frequency Ordered Stop   06/18/22 1400  piperacillin-tazobactam (ZOSYN) IVPB 3.375 g        3.375 g 12.5 mL/hr over 240 Minutes Intravenous Every 8 hours 06/18/22 1307     06/17/22 2000  penicillin G potassium 4 Million Units in dextrose 5 % 250 mL IVPB  Status:  Discontinued        4 Million Units 250 mL/hr over 60 Minutes Intravenous Every 4 hours 06/17/22 1640 06/18/22 1307   06/17/22 0115  cefTRIAXone (ROCEPHIN) 2 g in sodium chloride 0.9 % 100 mL IVPB  Status:  Discontinued        2  g 200 mL/hr over 30 Minutes Intravenous Every 24 hours 06/17/22 0114 06/17/22 1640   06/17/22 0115  azithromycin (ZITHROMAX) 500 mg in sodium chloride 0.9 % 250 mL IVPB  Status:  Discontinued        500 mg 250 mL/hr over 60 Minutes Intravenous Every 24 hours 06/17/22 0114 06/18/22 1307              Family Communication/Anticipated D/C date and plan/Code Status   DVT prophylaxis: SCDs Start: 06/17/22 0455     Code Status: Full Code  Family Communication: None Disposition Plan: Plan to discharge in 3 to 5 days   Status is: Inpatient Remains inpatient appropriate because: Severe sepsis, bacteremia on IV antibiotic, alcohol withdrawal syndrome       Subjective:   Interval events noted.  She has been restless and agitated and has been requiring frequent doses of IV Ativan.  She has a Comptroller at the bedside for safety.  Objective:    Vitals:   06/18/22 1232 06/18/22 1300 06/18/22 1320 06/18/22 1335  BP: 94/79 106/79  (!) 131/103  Pulse:  (!) 108 (!) 102 100  Resp:  (!) 21 (!) 31 (!) 24  Temp: 99.3 F (37.4 C)     TempSrc: Axillary     SpO2:  99% 99% 98%  Weight:   80.2 kg   Height:       No data found.   Intake/Output Summary (Last 24 hours) at 06/18/2022 1347 Last data filed at 06/18/2022 0000 Gross per 24 hour  Intake 3483.86 ml  Output --  Net 3483.86 ml   Filed Weights   06/16/22 2152 06/18/22 1320  Weight: 68 kg 80.2 kg    Exam:  GEN: NAD SKIN: Warm and dry EYES: Facial swelling and tenderness with bilateral periorbital erythema and swelling.  Eyes are swollen shut ENT: MMM CV: RRR PULM: CTA B ABD: soft, ND, NT, +BS CNS: Lethargic, restless/agitated EXT: No edema or tenderness         Data Reviewed:   I have personally reviewed following labs and imaging studies:  Labs: Labs show the following:   Basic Metabolic Panel: Recent Labs  Lab 06/16/22 2156 06/17/22 0503 06/17/22 0834 06/18/22 0526  NA 135 135  --  132*  K 3.0*  2.7*  --  3.6  CL 101 104  --  106  CO2 24 23  --  20*  GLUCOSE 118* 106*  --  91  BUN 12 14  --  14  CREATININE 0.87 0.80  --  0.77  CALCIUM 9.0 7.6*  --  7.6*  MG  --   --  0.8* 1.9  PHOS  --   --  2.9 1.8*   GFR Estimated Creatinine Clearance: 90 mL/min (by C-G formula based on SCr of 0.77 mg/dL). Liver Function Tests: Recent Labs  Lab 06/16/22 2156 06/17/22 0503  AST 32 22  ALT 15 12  ALKPHOS 73 49  BILITOT 0.7 0.3  PROT 7.5 6.0*  ALBUMIN 3.8 3.0*   No results for input(s): "LIPASE", "AMYLASE" in the last 168 hours. No results for input(s): "AMMONIA" in the last 168 hours. Coagulation profile No results for input(s): "INR", "PROTIME" in the last 168 hours.  CBC: Recent Labs  Lab 06/16/22 2156 06/17/22 0503 06/18/22 0526  WBC 15.1* 13.0* 16.9*  NEUTROABS 14.6* 12.5*  --   HGB 11.0* 9.5* 9.0*  HCT 33.8* 29.8* 28.5*  MCV 98.8 102.1* 100.7*  PLT 220 165 135*   Cardiac Enzymes: No results for input(s): "CKTOTAL", "CKMB", "CKMBINDEX", "TROPONINI" in the last 168 hours. BNP (last 3 results) No results for input(s): "PROBNP" in the last 8760 hours. CBG: Recent Labs  Lab 06/18/22 1313  GLUCAP 84   D-Dimer: No results for input(s): "DDIMER" in the last 72 hours. Hgb A1c: No results for input(s): "HGBA1C" in the last 72 hours. Lipid Profile: No results for input(s): "CHOL", "HDL", "LDLCALC", "TRIG", "CHOLHDL", "LDLDIRECT" in the last 72 hours. Thyroid function studies: No results for input(s): "TSH", "T4TOTAL", "T3FREE", "THYROIDAB" in the last 72 hours.  Invalid input(s): "FREET3" Anemia work up: No results for input(s): "VITAMINB12", "FOLATE", "FERRITIN", "TIBC", "IRON", "RETICCTPCT" in the last 72 hours. Sepsis Labs: Recent Labs  Lab 06/16/22 2156 06/17/22 0157 06/17/22 0503 06/17/22 0748 06/18/22 0526  PROCALCITON  --  3.70  --   --  23.31  WBC 15.1*  --  13.0*  --  16.9*  LATICACIDVEN  --  1.4 1.8 2.1* 1.5    Microbiology Recent Results  (from the past 240 hour(s))  Blood culture (routine x 2)     Status: Abnormal (Preliminary result)   Collection Time: 06/17/22  1:57 AM   Specimen: BLOOD  Result Value Ref Range Status   Specimen Description   Final    BLOOD BLOOD LEFT FOREARM Performed at [facility], [address], [place], [place] [address]    Special Requests   Final    BOTTLES DRAWN AEROBIC AND ANAEROBIC Blood Culture adequate volume Performed at [facility], [address] [place], [place] [address]    Culture  Setup Time   Final    GRAM POSITIVE COCCI IN BOTH AEROBIC AND ANAEROBIC BOTTLES CRITICAL RESULT CALLED TO, READ BACK BY AND VERIFIED WITH: RAQUEL RODRIGUEZ GUZMAN ON 06/17/22 @1542  QSD Performed at [facility], [address], [place], [place] [address]    Culture (A)  Final    GROUP A STREP (S.PYOGENES) ISOLATED SUSCEPTIBILITIES TO FOLLOW Performed at [facility], [address] [place], [place] [address]    Report Status PENDING  Incomplete  Blood culture (routine x 2)     Status: Abnormal (Preliminary result)   Collection Time: 06/17/22  1:57 AM   Specimen: BLOOD  Result Value Ref Range Status   Specimen Description   Final    BLOOD BLOOD LEFT WRIST Performed at [facility], [address] [place], [place] [address]    Special Requests   Final    BOTTLES DRAWN AEROBIC AND ANAEROBIC Blood Culture adequate volume Performed at [facility], [address], [place], [place] [address]    Culture  Setup Time   Final    GRAM POSITIVE COCCI IN CHAINS AEROBIC BOTTLE ONLY CRITICAL VALUE NOTED.  VALUE IS CONSISTENT WITH PREVIOUSLY REPORTED AND CALLED VALUE.    Culture (A)  Final    GROUP A STREP (S.PYOGENES) ISOLATED CULTURE REINCUBATED FOR BETTER GROWTH Performed at [facility], [address], [place], [place] [address]    Report Status PENDING  Incomplete  SARS Coronavirus 2 by RT PCR (hospital order, performed in [facility] hospital lab) *cepheid single result test* Anterior Nasal Swab     Status: None   Collection Time: 06/17/22  1:57 AM   Specimen: Anterior Nasal Swab  Result Value Ref Range Status   SARS Coronavirus 2 by RT PCR NEGATIVE NEGATIVE Final    Comment: (NOTE) SARS-CoV-2 target nucleic acids are NOT DETECTED.  The SARS-CoV-2 RNA is generally detectable in upper and lower respiratory specimens during the acute phase of infection. The lowest concentration of SARS-CoV-2 viral copies this assay can detect is 250 copies / mL. A negative result does not preclude SARS-CoV-2 infection and should not be used as the sole basis for treatment or other patient management decisions.  A negative result may occur with improper specimen collection / handling, submission of specimen other than nasopharyngeal swab, presence of viral mutation(s) within the areas targeted by this assay, and inadequate number of viral copies (<250 copies / mL). A negative result must be combined with clinical observations, patient history, and epidemiological information.  Fact Sheet for Patients:   RoadLapTop.co.za  Fact Sheet for Healthcare Providers: http://kim-miller.com/  This test is not yet approved or  cleared by the [place] FDA and has been authorized for detection and/or diagnosis of SARS-CoV-2 by FDA under an Emergency Use Authorization (EUA).  This EUA will remain in effect (meaning this test can be used) for the duration of the COVID-19 declaration under Section 564(b)(1) of the Act, 21 U.S.C. section 360bbb-3(b)(1), unless the authorization is terminated or revoked sooner.  Performed at [facility], [address] [place], [place] [address]   Blood Culture ID Panel (Reflexed)     Status: Abnormal   Collection Time: 06/17/22  1:57 AM  Result Value Ref Range Status   Enterococcus faecalis NOT DETECTED NOT DETECTED Final   Enterococcus  Faecium NOT DETECTED NOT DETECTED Final   Listeria monocytogenes NOT DETECTED NOT DETECTED Final   Staphylococcus species NOT DETECTED NOT DETECTED Final   Staphylococcus aureus (BCID) NOT DETECTED NOT DETECTED Final   Staphylococcus epidermidis NOT DETECTED NOT DETECTED Final   Staphylococcus lugdunensis NOT DETECTED NOT DETECTED Final   Streptococcus species DETECTED (A) NOT DETECTED Final    Comment: CRITICAL RESULT CALLED TO, READ BACK BY AND VERIFIED WITH: RAQUEL RODRIGUEZ GUZMAN ON 06/17/22 AT 1542 QSD    Streptococcus agalactiae NOT DETECTED NOT DETECTED Final   Streptococcus pneumoniae NOT DETECTED NOT DETECTED Final   Streptococcus pyogenes DETECTED (A) NOT DETECTED Final    Comment: CRITICAL RESULT CALLED TO, READ BACK BY AND VERIFIED WITH: RAQUEL RODRIGUEZ GUZMAN ON 06/17/22 AT 1542 QSD    A.calcoaceticus-baumannii NOT DETECTED NOT DETECTED Final   Bacteroides fragilis NOT DETECTED NOT DETECTED Final   Enterobacterales NOT DETECTED NOT DETECTED Final   Enterobacter cloacae complex NOT DETECTED NOT DETECTED Final   Escherichia coli NOT DETECTED NOT DETECTED Final   Klebsiella aerogenes NOT DETECTED NOT DETECTED Final   Klebsiella oxytoca NOT DETECTED NOT DETECTED Final   Klebsiella pneumoniae NOT DETECTED NOT DETECTED Final   Proteus species NOT DETECTED NOT DETECTED Final   Salmonella species NOT DETECTED NOT DETECTED Final   Serratia marcescens NOT DETECTED NOT DETECTED Final   Haemophilus influenzae NOT DETECTED NOT DETECTED Final  Neisseria meningitidis NOT DETECTED NOT DETECTED Final   Pseudomonas aeruginosa NOT DETECTED NOT DETECTED Final   Stenotrophomonas maltophilia NOT DETECTED NOT DETECTED Final   Candida albicans NOT DETECTED NOT DETECTED Final   Candida auris NOT DETECTED NOT DETECTED Final   Candida glabrata NOT DETECTED NOT DETECTED Final   Candida krusei NOT DETECTED NOT DETECTED Final   Candida parapsilosis NOT DETECTED NOT DETECTED Final   Candida  tropicalis NOT DETECTED NOT DETECTED Final   Cryptococcus neoformans/gattii NOT DETECTED NOT DETECTED Final    Comment: Performed at [facility], [address] [place], [place] [address]  Urine Culture     Status: Abnormal   Collection Time: 06/17/22  3:00 AM   Specimen: Urine, Random  Result Value Ref Range Status   Specimen Description   Final    URINE, RANDOM Performed at [facility], [address] [place], [place] [address]    Special Requests   Final    NONE Performed at [facility], [address] [place], [place] [address]    Culture MULTIPLE SPECIES PRESENT, SUGGEST RECOLLECTION (A)  Final   Report Status 06/18/2022 FINAL  Final    Procedures and diagnostic studies:  CT L-SPINE NO CHARGE  Result Date: 06/17/2022 CLINICAL DATA:  Fall down stairs, back injury EXAM: CT LUMBAR SPINE WITHOUT CONTRAST TECHNIQUE: Multidetector CT imaging of the lumbar spine was performed without intravenous contrast administration. Multiplanar CT image reconstructions were also generated. RADIATION DOSE REDUCTION: This exam was performed according to the departmental dose-optimization program which includes automated exposure control, adjustment of the mA and/or kV according to patient size and/or use of iterative reconstruction technique. COMPARISON:  None Available. FINDINGS: Segmentation: 5 lumbar type vertebrae. Alignment: Normal. Vertebrae: No acute fracture or focal pathologic process. Paraspinal and other soft tissues: Negative. Disc levels: No significant neuroforaminal narrowing or canal stenosis noted at T12-L3. Broad-based disc bulge at L3-4 in combination with mild hypertrophy of the lamina propria results in mild central canal stenosis. No neuroforaminal narrowing. Mild left facet arthrosis. Broad-based disc bulge at L4-5 in combination with hypertrophy of the lamina propria results in mild central canal stenosis. No significant neuroforaminal narrowing.  Moderate bilateral facet arthrosis. Mild broad-based disc bulge at L5-S1 without significant canal stenosis. No neuroforaminal narrowing. Mild bilateral facet arthrosis. IMPRESSION: 1. No acute fracture or listhesis. 2. Mild broad-based disc bulges at L3-4 and L4-5 in combination with hypertrophy of the lamina propria results in mild central canal stenosis. Electronically Signed   By: Helyn Numbers M.D.   On: 06/17/2022 00:19   CT CHEST ABDOMEN PELVIS W CONTRAST  Result Date: 06/17/2022 CLINICAL DATA:  Polytrauma, blunt.  Fall downstairs EXAM: CT CHEST, ABDOMEN, AND PELVIS WITH CONTRAST TECHNIQUE: Multidetector CT imaging of the chest, abdomen and pelvis was performed following the standard protocol during bolus administration of intravenous contrast. RADIATION DOSE REDUCTION: This exam was performed according to the departmental dose-optimization program which includes automated exposure control, adjustment of the mA and/or kV according to patient size and/or use of iterative reconstruction technique. CONTRAST:  75mL OMNIPAQUE IOHEXOL 350 MG/ML SOLN COMPARISON:  CT abdomen pelvis 05/11/2022 FINDINGS: CT CHEST FINDINGS Cardiovascular: Extensive multi-vessel coronary artery calcification. Global cardiac size within normal limits. No pericardial effusion. Central pulmonary arteries are of normal caliber. Thoracic aorta is unremarkable. Mediastinum/Nodes: No enlarged mediastinal, hilar, or axillary lymph nodes. Thyroid gland, trachea, and esophagus demonstrate no significant findings. Lungs/Pleura: Minimal focal ground-glass pulmonary infiltrate within the left lower lobe is nonspecific, possibly  infectious or inflammatory in nature. The lungs are otherwise clear. No pneumothorax or pleural effusion. Central airways are widely patent. Musculoskeletal: No chest wall mass or suspicious bone lesions identified. CT ABDOMEN PELVIS FINDINGS Hepatobiliary: No focal liver abnormality is seen. No gallstones, gallbladder  wall thickening, or biliary dilatation. Pancreas: Unremarkable Spleen: Unremarkable Adrenals/Urinary Tract: Adrenal glands are unremarkable. Kidneys are normal, without renal calculi, focal lesion, or hydronephrosis. Bladder is unremarkable. Stomach/Bowel: Stomach is within normal limits. Appendix appears normal. No evidence of bowel wall thickening, distention, or inflammatory changes. Vascular/Lymphatic: Aortic atherosclerosis. No enlarged abdominal or pelvic lymph nodes. Reproductive: Uterus and bilateral adnexa are unremarkable. Other: No abdominal wall hernia or abnormality. No abdominopelvic ascites. Musculoskeletal: No acute bone abnormality. No lytic or blastic bone lesion. IMPRESSION: 1. No acute intrathoracic or intra-abdominal pathology identified. 2. Extensive multi-vessel coronary artery calcification. 3. Minimal focal ground-glass pulmonary infiltrate within the left lower lobe, nonspecific, possibly infectious or inflammatory in nature. Aortic Atherosclerosis (ICD10-I70.0). Electronically Signed   By: Helyn Numbers M.D.   On: 06/17/2022 00:12   CT Head Wo Contrast  Result Date: 06/16/2022 CLINICAL DATA:  Fall down stairs EXAM: CT HEAD WITHOUT CONTRAST CT MAXILLOFACIAL WITHOUT CONTRAST CT CERVICAL SPINE WITHOUT CONTRAST TECHNIQUE: Multidetector CT imaging of the head, cervical spine, and maxillofacial structures were performed using the standard protocol without intravenous contrast. Multiplanar CT image reconstructions of the cervical spine and maxillofacial structures were also generated. RADIATION DOSE REDUCTION: This exam was performed according to the departmental dose-optimization program which includes automated exposure control, adjustment of the mA and/or kV according to patient size and/or use of iterative reconstruction technique. COMPARISON:  None Available. FINDINGS: CT HEAD FINDINGS Brain: There is no mass, hemorrhage or extra-axial collection. The size and configuration of the  ventricles and extra-axial CSF spaces are normal. The brain parenchyma is normal, without evidence of acute or chronic infarction. Vascular: No abnormal hyperdensity of the major intracranial arteries or dural venous sinuses. No intracranial atherosclerosis. Skull: Large anterior scalp hematoma without skull fracture CT MAXILLOFACIAL FINDINGS Osseous: No acute fracture Orbits: The globes are intact. Normal appearance of the intra- and extraconal fat. Symmetric extraocular muscles and optic nerves. Chronic left orbital floor defect. Periorbital soft tissue swelling. Sinuses: No fluid levels or advanced mucosal thickening. Soft tissues: Normal visualized extracranial soft tissues. CT CERVICAL SPINE FINDINGS Alignment: No static subluxation. Facets are aligned. Occipital condyles and the lateral masses of C1-C2 are aligned. Skull base and vertebrae: No acute fracture. Soft tissues and spinal canal: No prevertebral fluid or swelling. No visible canal hematoma. Disc levels: No advanced spinal canal or neural foraminal stenosis. Upper chest: No pneumothorax, pulmonary nodule or pleural effusion. Other: Normal visualized paraspinal cervical soft tissues. IMPRESSION: 1. No acute intracranial abnormality. 2. Large anterior scalp and facial hematoma without skull fracture. 3. No acute fracture or static subluxation of the cervical spine. 4. No acute fracture or static subluxation of the facial bones. Electronically Signed   By: Deatra Robinson M.D.   On: 06/16/2022 22:38   CT Maxillofacial Wo Contrast  Result Date: 06/16/2022 CLINICAL DATA:  Fall down stairs EXAM: CT HEAD WITHOUT CONTRAST CT MAXILLOFACIAL WITHOUT CONTRAST CT CERVICAL SPINE WITHOUT CONTRAST TECHNIQUE: Multidetector CT imaging of the head, cervical spine, and maxillofacial structures were performed using the standard protocol without intravenous contrast. Multiplanar CT image reconstructions of the cervical spine and maxillofacial structures were also  generated. RADIATION DOSE REDUCTION: This exam was performed according to the departmental dose-optimization program which includes automated exposure control,  adjustment of the mA and/or kV according to patient size and/or use of iterative reconstruction technique. COMPARISON:  None Available. FINDINGS: CT HEAD FINDINGS Brain: There is no mass, hemorrhage or extra-axial collection. The size and configuration of the ventricles and extra-axial CSF spaces are normal. The brain parenchyma is normal, without evidence of acute or chronic infarction. Vascular: No abnormal hyperdensity of the major intracranial arteries or dural venous sinuses. No intracranial atherosclerosis. Skull: Large anterior scalp hematoma without skull fracture CT MAXILLOFACIAL FINDINGS Osseous: No acute fracture Orbits: The globes are intact. Normal appearance of the intra- and extraconal fat. Symmetric extraocular muscles and optic nerves. Chronic left orbital floor defect. Periorbital soft tissue swelling. Sinuses: No fluid levels or advanced mucosal thickening. Soft tissues: Normal visualized extracranial soft tissues. CT CERVICAL SPINE FINDINGS Alignment: No static subluxation. Facets are aligned. Occipital condyles and the lateral masses of C1-C2 are aligned. Skull base and vertebrae: No acute fracture. Soft tissues and spinal canal: No prevertebral fluid or swelling. No visible canal hematoma. Disc levels: No advanced spinal canal or neural foraminal stenosis. Upper chest: No pneumothorax, pulmonary nodule or pleural effusion. Other: Normal visualized paraspinal cervical soft tissues. IMPRESSION: 1. No acute intracranial abnormality. 2. Large anterior scalp and facial hematoma without skull fracture. 3. No acute fracture or static subluxation of the cervical spine. 4. No acute fracture or static subluxation of the facial bones. Electronically Signed   By: Deatra Robinson M.D.   On: 06/16/2022 22:38   CT Cervical Spine Wo Contrast  Result  Date: 06/16/2022 CLINICAL DATA:  Fall down stairs EXAM: CT HEAD WITHOUT CONTRAST CT MAXILLOFACIAL WITHOUT CONTRAST CT CERVICAL SPINE WITHOUT CONTRAST TECHNIQUE: Multidetector CT imaging of the head, cervical spine, and maxillofacial structures were performed using the standard protocol without intravenous contrast. Multiplanar CT image reconstructions of the cervical spine and maxillofacial structures were also generated. RADIATION DOSE REDUCTION: This exam was performed according to the departmental dose-optimization program which includes automated exposure control, adjustment of the mA and/or kV according to patient size and/or use of iterative reconstruction technique. COMPARISON:  None Available. FINDINGS: CT HEAD FINDINGS Brain: There is no mass, hemorrhage or extra-axial collection. The size and configuration of the ventricles and extra-axial CSF spaces are normal. The brain parenchyma is normal, without evidence of acute or chronic infarction. Vascular: No abnormal hyperdensity of the major intracranial arteries or dural venous sinuses. No intracranial atherosclerosis. Skull: Large anterior scalp hematoma without skull fracture CT MAXILLOFACIAL FINDINGS Osseous: No acute fracture Orbits: The globes are intact. Normal appearance of the intra- and extraconal fat. Symmetric extraocular muscles and optic nerves. Chronic left orbital floor defect. Periorbital soft tissue swelling. Sinuses: No fluid levels or advanced mucosal thickening. Soft tissues: Normal visualized extracranial soft tissues. CT CERVICAL SPINE FINDINGS Alignment: No static subluxation. Facets are aligned. Occipital condyles and the lateral masses of C1-C2 are aligned. Skull base and vertebrae: No acute fracture. Soft tissues and spinal canal: No prevertebral fluid or swelling. No visible canal hematoma. Disc levels: No advanced spinal canal or neural foraminal stenosis. Upper chest: No pneumothorax, pulmonary nodule or pleural effusion. Other:  Normal visualized paraspinal cervical soft tissues. IMPRESSION: 1. No acute intracranial abnormality. 2. Large anterior scalp and facial hematoma without skull fracture. 3. No acute fracture or static subluxation of the cervical spine. 4. No acute fracture or static subluxation of the facial bones. Electronically Signed   By: Deatra Robinson M.D.   On: 06/16/2022 22:38  LOS: 1 day   Graceson Nichelson  [facility]   Pager on [email or web address]. If 7PM-7AM, please contact night-coverage at [email or web address]     06/18/2022, 1:47 PM

## 2022-06-18 NOTE — Progress Notes (Signed)
MEDICATION RELATED CONSULT NOTE - INITIAL   Pharmacy Consult for IVIG Indication: Group A Strep Bacteremia/? Possible streptococcal toxic shock syndrome  Allergies  Allergen Reactions   Prednisone     Feet swelling     Patient Measurements: Height: 5\' 5"  (165.1 cm) Weight: 80.2 kg (176 lb 12.9 oz) IBW/kg (Calculated) : 57 Adjusted Body Weight: 66.3 kg  Vital Signs: Temp: 99.3 F (37.4 C) (06/26 1232) Temp Source: Axillary (06/26 1232) BP: 81/61 (06/26 1845) Pulse Rate: 85 (06/26 1845) Intake/Output from previous day: 06/25 0701 - 06/26 0700 In: 4203.9 [P.O.:1080; I.V.:2595.8; IV Piggyback:528.1] Out: -  Intake/Output from this shift: No intake/output data recorded.  Labs: Recent Labs    06/16/22 2156 06/17/22 0503 06/17/22 0834 06/18/22 0526  WBC 15.1* 13.0*  --  16.9*  HGB 11.0* 9.5*  --  9.0*  HCT 33.8* 29.8*  --  28.5*  PLT 220 165  --  135*  CREATININE 0.87 0.80  --  0.77  MG  --   --  0.8* 1.9  PHOS  --   --  2.9 1.8*  ALBUMIN 3.8 3.0*  --   --   PROT 7.5 6.0*  --   --   AST 32 22  --   --   ALT 15 12  --   --   ALKPHOS 73 49  --   --   BILITOT 0.7 0.3  --   --   BILIDIR  --  0.2  --   --   IBILI  --  0.1*  --   --    Estimated Creatinine Clearance: 90 mL/min (by C-G formula based on SCr of 0.77 mg/dL).   Microbiology: Recent Results (from the past 720 hour(s))  Blood culture (routine x 2)     Status: Abnormal (Preliminary result)   Collection Time: 06/17/22  1:57 AM   Specimen: BLOOD  Result Value Ref Range Status   Specimen Description   Final    BLOOD BLOOD LEFT FOREARM Performed at [facility], [address] [place], [place] [address]    Special Requests   Final    BOTTLES DRAWN AEROBIC AND ANAEROBIC Blood Culture adequate volume Performed at [facility], [address], [place], [place] [address]    Culture  Setup Time   Final    GRAM POSITIVE COCCI IN BOTH AEROBIC AND ANAEROBIC BOTTLES CRITICAL RESULT CALLED  TO, READ BACK BY AND VERIFIED WITH: RAQUEL RODRIGUEZ GUZMAN ON 06/17/22 @1542  QSD Performed at [facility], [address], [place], [place] [address]    Culture (A)  Final    GROUP A STREP (S.PYOGENES) ISOLATED SUSCEPTIBILITIES TO FOLLOW Performed at [facility], [address], [place], [place] [address]    Report Status PENDING  Incomplete  Blood culture (routine x 2)     Status: Abnormal (Preliminary result)   Collection Time: 06/17/22  1:57 AM   Specimen: BLOOD  Result Value Ref Range Status   Specimen Description   Final    BLOOD BLOOD LEFT WRIST Performed at [facility], [address] [place], [place] [address]    Special Requests   Final    BOTTLES DRAWN AEROBIC AND ANAEROBIC Blood Culture adequate volume Performed at [facility], [address], [place], [place] [address]    Culture  Setup Time   Final    GRAM POSITIVE COCCI IN CHAINS AEROBIC BOTTLE ONLY CRITICAL VALUE NOTED.  VALUE IS CONSISTENT WITH PREVIOUSLY REPORTED  AND CALLED VALUE.    Culture (A)  Final    GROUP A STREP (S.PYOGENES) ISOLATED CULTURE REINCUBATED FOR BETTER GROWTH Performed at [facility], [address], [place], [place] [address]    Report Status PENDING  Incomplete  SARS Coronavirus 2 by RT PCR (hospital order, performed in [facility] hospital lab) *cepheid single result test* Anterior Nasal Swab     Status: None   Collection Time: 06/17/22  1:57 AM   Specimen: Anterior Nasal Swab  Result Value Ref Range Status   SARS Coronavirus 2 by RT PCR NEGATIVE NEGATIVE Final    Comment: (NOTE) SARS-CoV-2 target nucleic acids are NOT DETECTED.  The SARS-CoV-2 RNA is generally detectable in upper and lower respiratory specimens during the acute phase of infection. The lowest concentration of SARS-CoV-2 viral copies this assay can detect is 250 copies / mL. A negative result does not preclude SARS-CoV-2 infection and should not be used as the sole basis  for treatment or other patient management decisions.  A negative result may occur with improper specimen collection / handling, submission of specimen other than nasopharyngeal swab, presence of viral mutation(s) within the areas targeted by this assay, and inadequate number of viral copies (<250 copies / mL). A negative result must be combined with clinical observations, patient history, and epidemiological information.  Fact Sheet for Patients:   RoadLapTop.co.za  Fact Sheet for Healthcare Providers: http://kim-miller.com/  This test is not yet approved or  cleared by the [place] FDA and has been authorized for detection and/or diagnosis of SARS-CoV-2 by FDA under an Emergency Use Authorization (EUA).  This EUA will remain in effect (meaning this test can be used) for the duration of the COVID-19 declaration under Section 564(b)(1) of the Act, 21 U.S.C. section 360bbb-3(b)(1), unless the authorization is terminated or revoked sooner.  Performed at [facility], [address] [place], [place] [address]   Blood Culture ID Panel (Reflexed)     Status: Abnormal   Collection Time: 06/17/22  1:57 AM  Result Value Ref Range Status   Enterococcus faecalis NOT DETECTED NOT DETECTED Final   Enterococcus Faecium NOT DETECTED NOT DETECTED Final   Listeria monocytogenes NOT DETECTED NOT DETECTED Final   Staphylococcus species NOT DETECTED NOT DETECTED Final   Staphylococcus aureus (BCID) NOT DETECTED NOT DETECTED Final   Staphylococcus epidermidis NOT DETECTED NOT DETECTED Final   Staphylococcus lugdunensis NOT DETECTED NOT DETECTED Final   Streptococcus species DETECTED (A) NOT DETECTED Final    Comment: CRITICAL RESULT CALLED TO, READ BACK BY AND VERIFIED WITH: RAQUEL RODRIGUEZ GUZMAN ON 06/17/22 AT 1542 QSD    Streptococcus agalactiae NOT DETECTED NOT DETECTED Final   Streptococcus pneumoniae NOT DETECTED NOT DETECTED Final    Streptococcus pyogenes DETECTED (A) NOT DETECTED Final    Comment: CRITICAL RESULT CALLED TO, READ BACK BY AND VERIFIED WITH: RAQUEL RODRIGUEZ GUZMAN ON 06/17/22 AT 1542 QSD    A.calcoaceticus-baumannii NOT DETECTED NOT DETECTED Final   Bacteroides fragilis NOT DETECTED NOT DETECTED Final   Enterobacterales NOT DETECTED NOT DETECTED Final   Enterobacter cloacae complex NOT DETECTED NOT DETECTED Final   Escherichia coli NOT DETECTED NOT DETECTED Final   Klebsiella aerogenes NOT DETECTED NOT DETECTED Final   Klebsiella oxytoca NOT DETECTED NOT DETECTED Final   Klebsiella pneumoniae NOT DETECTED NOT DETECTED Final   Proteus species NOT DETECTED NOT DETECTED Final   Salmonella species NOT DETECTED NOT DETECTED Final   Serratia marcescens NOT DETECTED NOT DETECTED Final  Haemophilus influenzae NOT DETECTED NOT DETECTED Final   Neisseria meningitidis NOT DETECTED NOT DETECTED Final   Pseudomonas aeruginosa NOT DETECTED NOT DETECTED Final   Stenotrophomonas maltophilia NOT DETECTED NOT DETECTED Final   Candida albicans NOT DETECTED NOT DETECTED Final   Candida auris NOT DETECTED NOT DETECTED Final   Candida glabrata NOT DETECTED NOT DETECTED Final   Candida krusei NOT DETECTED NOT DETECTED Final   Candida parapsilosis NOT DETECTED NOT DETECTED Final   Candida tropicalis NOT DETECTED NOT DETECTED Final   Cryptococcus neoformans/gattii NOT DETECTED NOT DETECTED Final    Comment: Performed at [facility], [address], [place], [place] [address]  Urine Culture     Status: Abnormal   Collection Time: 06/17/22  3:00 AM   Specimen: Urine, Random  Result Value Ref Range Status   Specimen Description   Final    URINE, RANDOM Performed at [facility], [address] [place], [place] [address]    Special Requests   Final    NONE Performed at [facility], [address] [place], [place] [address]    Culture MULTIPLE SPECIES PRESENT, SUGGEST RECOLLECTION  (A)  Final   Report Status 06/18/2022 FINAL  Final  MRSA Next Gen by PCR, Nasal     Status: None   Collection Time: 06/18/22  1:24 PM   Specimen: Nasal Mucosa; Nasal Swab  Result Value Ref Range Status   MRSA by PCR Next Gen NOT DETECTED NOT DETECTED Final    Comment: (NOTE) The GeneXpert MRSA Assay (FDA approved for NASAL specimens only), is one component of a comprehensive MRSA colonization surveillance program. It is not intended to diagnose MRSA infection nor to guide or monitor treatment for MRSA infections. Test performance is not FDA approved in patients less than 73 years old. Performed at [facility], [address] [place], [place] [address]     Medical History: Past Medical History:  Diagnosis Date   Acid reflux    Alcohol abuse    Anxiety    Depression    Hypertension    Hypomagnesemia 05/12/2022   Hyponatremia 05/12/2022   Hypophosphatemia 05/12/2022   PTSD (post-traumatic stress disorder)     Medications:  Scheduled:   Chlorhexidine Gluconate Cloth  6 each Topical Daily   [START ON 06/19/2022] citalopram  40 mg Per Tube Daily   docusate  100 mg Per Tube BID   [START ON 06/19/2022] folic acid  1 mg Per Tube Daily   [START ON 06/19/2022] gabapentin  300 mg Per Tube Q1200   methylPREDNISolone (SOLU-MEDROL) injection  40 mg Intravenous Q12H   [START ON 06/19/2022] multivitamin with minerals  1 tablet Per Tube Daily   [START ON 06/19/2022] pantoprazole sodium  40 mg Per Tube Daily   polyethylene glycol  17 g Per Tube Daily   [START ON 06/19/2022] thiamine  100 mg Per Tube Daily   Or   [START ON 06/19/2022] thiamine  100 mg Intravenous Daily   Infusions:   sodium chloride 100 mL/hr at 06/18/22 1900   dexmedetomidine (PRECEDEX) IV infusion Stopped (06/18/22 1518)   fentaNYL infusion INTRAVENOUS 100 mcg/hr (06/18/22 1940)   Immune Globulin 10%     linezolid (ZYVOX) IV Stopped (06/18/22 1837)   norepinephrine (LEVOPHED) Adult infusion      piperacillin-tazobactam (ZOSYN)  IV Stopped (06/18/22 1732)    Assessment: 51 yo F with consult for IVIG for Strep bacteremia/?possible streptococcal toxic shock syndrome. Discussion with ID/Dr. Arnoldo Lenis Rust-Chester  Plan:  Will order Immune Globulin 10%(Privigen) 1 gram/kg (adjusted body weight) x 1 dose. Dose= 65 GM -ID will follow up 6/27 and assess for more doses.   Martin Belling A PharmD 06/18/2022,8:40 PM

## 2022-06-19 DIAGNOSIS — A419 Sepsis, unspecified organism: Secondary | ICD-10-CM | POA: Diagnosis not present

## 2022-06-19 DIAGNOSIS — A491 Streptococcal infection, unspecified site: Secondary | ICD-10-CM

## 2022-06-19 DIAGNOSIS — A483 Toxic shock syndrome: Secondary | ICD-10-CM | POA: Diagnosis not present

## 2022-06-19 DIAGNOSIS — B955 Unspecified streptococcus as the cause of diseases classified elsewhere: Secondary | ICD-10-CM | POA: Diagnosis not present

## 2022-06-19 DIAGNOSIS — R6520 Severe sepsis without septic shock: Secondary | ICD-10-CM | POA: Diagnosis not present

## 2022-06-19 LAB — RENAL FUNCTION PANEL
Albumin: 2.4 g/dL — ABNORMAL LOW (ref 3.5–5.0)
Anion gap: 3 — ABNORMAL LOW (ref 5–15)
BUN: 12 mg/dL (ref 6–20)
CO2: 21 mmol/L — ABNORMAL LOW (ref 22–32)
Calcium: 8 mg/dL — ABNORMAL LOW (ref 8.9–10.3)
Chloride: 107 mmol/L (ref 98–111)
Creatinine, Ser: 0.68 mg/dL (ref 0.44–1.00)
GFR, Estimated: >60 mL/min (ref >60)
Glucose, Bld: 123 mg/dL — ABNORMAL HIGH (ref 70–99)
Phosphorus: 3.2 mg/dL (ref 2.5–4.6)
Potassium: 4.3 mmol/L (ref 3.5–5.1)
Sodium: 131 mmol/L — ABNORMAL LOW (ref 135–145)

## 2022-06-19 LAB — GLUCOSE, CAPILLARY
Glucose-Capillary: 128 mg/dL — ABNORMAL HIGH (ref 70–99)
Glucose-Capillary: 119 mg/dL — ABNORMAL HIGH (ref 70–99)
Glucose-Capillary: 164 mg/dL — ABNORMAL HIGH (ref 70–99)
Glucose-Capillary: 147 mg/dL — ABNORMAL HIGH (ref 70–99)
Glucose-Capillary: 141 mg/dL — ABNORMAL HIGH (ref 70–99)
Glucose-Capillary: 170 mg/dL — ABNORMAL HIGH (ref 70–99)

## 2022-06-19 LAB — CBC
HCT: 27.4 % — ABNORMAL LOW (ref 36.0–46.0)
Hemoglobin: 8.6 g/dL — ABNORMAL LOW (ref 12.0–15.0)
MCH: 32.6 pg (ref 26.0–34.0)
MCHC: 31.4 g/dL (ref 30.0–36.0)
MCV: 103.8 fL — ABNORMAL HIGH (ref 80.0–100.0)
Platelets: 121 10*3/uL — ABNORMAL LOW (ref 150–400)
RBC: 2.64 MIL/uL — ABNORMAL LOW (ref 3.87–5.11)
RDW: 13.7 % (ref 11.5–15.5)
WBC: 21.4 10*3/uL — ABNORMAL HIGH (ref 4.0–10.5)
nRBC: 0 % (ref 0.0–0.2)

## 2022-06-19 LAB — MAGNESIUM
Magnesium: 2 mg/dL (ref 1.7–2.4)
Magnesium: 1.9 mg/dL (ref 1.7–2.4)
Magnesium: 1.8 mg/dL (ref 1.7–2.4)

## 2022-06-19 LAB — PHOSPHORUS
Phosphorus: 3.1 mg/dL (ref 2.5–4.6)
Phosphorus: 2.5 mg/dL (ref 2.5–4.6)

## 2022-06-19 LAB — PROCALCITONIN: Procalcitonin: 9.98 ng/mL

## 2022-06-19 MED ORDER — IMMUNE GLOBULIN (HUMAN) 10 GM/100ML IV SOLN
0.5000 g/kg | INTRAVENOUS | Status: AC
  Administered 2022-06-19 – 2022-06-20 (×2): 35 g via INTRAVENOUS
  Filled 2022-06-19 (×2): qty 350

## 2022-06-19 MED ORDER — STERILE WATER FOR INJECTION IJ SOLN
INTRAMUSCULAR | Status: AC
  Administered 2022-06-19: 10 mL
  Filled 2022-06-19: qty 10

## 2022-06-19 MED ORDER — VECURONIUM BROMIDE 10 MG IV SOLR
INTRAVENOUS | Status: AC
  Administered 2022-06-19: 10 mg via INTRAVENOUS
  Filled 2022-06-19: qty 10

## 2022-06-19 MED ORDER — VITAL HIGH PROTEIN PO LIQD: 1000.0000 mL | ORAL | Status: DC

## 2022-06-19 MED ORDER — PHENOBARBITAL SODIUM 65 MG/ML IJ SOLN
65.0000 mg | Freq: Three times a day (TID) | INTRAMUSCULAR | Status: DC
  Administered 2022-06-19 – 2022-06-20 (×4): 65 mg via INTRAVENOUS
  Filled 2022-06-19 (×4): qty 1

## 2022-06-19 MED ORDER — MIDAZOLAM-SODIUM CHLORIDE 100-0.9 MG/100ML-% IV SOLN
2.0000 mg/h | INTRAVENOUS | Status: DC
  Administered 2022-06-19: 2 mg/h via INTRAVENOUS
  Administered 2022-06-20: 10 mg/h via INTRAVENOUS
  Filled 2022-06-19 (×2): qty 100

## 2022-06-19 MED ORDER — PHENOBARBITAL SODIUM 65 MG/ML IJ SOLN: 32.5000 mg | Freq: Three times a day (TID) | INTRAMUSCULAR | Status: DC

## 2022-06-19 MED ORDER — LORAZEPAM 2 MG/ML IJ SOLN: 1.0000 mg | INTRAMUSCULAR | Status: DC | PRN

## 2022-06-19 MED ORDER — FREE WATER
30.0000 mL | Status: DC
Start: 2022-06-19 — End: 2022-06-21
  Administered 2022-06-19 – 2022-06-21 (×12): 30 mL

## 2022-06-19 MED ORDER — PENICILLIN G POTASSIUM 20000000 UNITS IJ SOLR
12.0000 10*6.[IU] | Freq: Two times a day (BID) | INTRAVENOUS | Status: DC
  Administered 2022-06-20 – 2022-06-24 (×9): 12 10*6.[IU] via INTRAVENOUS
  Filled 2022-06-19 (×11): qty 12

## 2022-06-19 MED ORDER — STERILE WATER FOR INJECTION IJ SOLN
INTRAMUSCULAR | Status: AC
  Filled 2022-06-19: qty 10

## 2022-06-19 MED ORDER — POLYETHYLENE GLYCOL 3350 17 G PO PACK: 17.0000 g | PACK | Freq: Every day | ORAL | Status: DC

## 2022-06-19 MED ORDER — DOCUSATE SODIUM 50 MG/5ML PO LIQD
100.0000 mg | Freq: Two times a day (BID) | ORAL | Status: DC
Start: 2022-06-19 — End: 2022-06-19

## 2022-06-19 MED ORDER — VITAL AF 1.2 CAL PO LIQD
1000.0000 mL | ORAL | Status: DC
  Administered 2022-06-19: 1000 mL

## 2022-06-19 MED ORDER — VECURONIUM BROMIDE 10 MG IV SOLR
10.0000 mg | INTRAVENOUS | Status: DC | PRN
  Administered 2022-06-19: 10 mg via INTRAVENOUS
  Filled 2022-06-19 (×2): qty 10

## 2022-06-20 ENCOUNTER — Inpatient Hospital Stay: Payer: 59

## 2022-06-20 DIAGNOSIS — A483 Toxic shock syndrome: Secondary | ICD-10-CM | POA: Diagnosis not present

## 2022-06-20 DIAGNOSIS — R6520 Severe sepsis without septic shock: Secondary | ICD-10-CM | POA: Diagnosis not present

## 2022-06-20 DIAGNOSIS — B955 Unspecified streptococcus as the cause of diseases classified elsewhere: Secondary | ICD-10-CM | POA: Diagnosis not present

## 2022-06-20 DIAGNOSIS — A491 Streptococcal infection, unspecified site: Secondary | ICD-10-CM | POA: Diagnosis not present

## 2022-06-20 DIAGNOSIS — A419 Sepsis, unspecified organism: Secondary | ICD-10-CM | POA: Diagnosis not present

## 2022-06-20 LAB — GLUCOSE, CAPILLARY
Glucose-Capillary: 182 mg/dL — ABNORMAL HIGH (ref 70–99)
Glucose-Capillary: 154 mg/dL — ABNORMAL HIGH (ref 70–99)
Glucose-Capillary: 159 mg/dL — ABNORMAL HIGH (ref 70–99)
Glucose-Capillary: 177 mg/dL — ABNORMAL HIGH (ref 70–99)
Glucose-Capillary: 191 mg/dL — ABNORMAL HIGH (ref 70–99)
Glucose-Capillary: 219 mg/dL — ABNORMAL HIGH (ref 70–99)

## 2022-06-20 LAB — CULTURE, BLOOD (ROUTINE X 2)
Special Requests: ADEQUATE
Special Requests: ADEQUATE

## 2022-06-20 LAB — BASIC METABOLIC PANEL
Anion gap: 5 (ref 5–15)
BUN: 18 mg/dL (ref 6–20)
CO2: 21 mmol/L — ABNORMAL LOW (ref 22–32)
Calcium: 8 mg/dL — ABNORMAL LOW (ref 8.9–10.3)
Chloride: 105 mmol/L (ref 98–111)
Creatinine, Ser: 0.77 mg/dL (ref 0.44–1.00)
GFR, Estimated: >60 mL/min (ref >60)
Glucose, Bld: 178 mg/dL — ABNORMAL HIGH (ref 70–99)
Potassium: 4.1 mmol/L (ref 3.5–5.1)
Sodium: 131 mmol/L — ABNORMAL LOW (ref 135–145)

## 2022-06-20 LAB — BLOOD GAS, ARTERIAL
Acid-base deficit: 5.2 mmol/L — ABNORMAL HIGH (ref 0.0–2.0)
Bicarbonate: 20.6 mmol/L (ref 20.0–28.0)
FIO2: 40 %
MECHVT: 500 mL
Mechanical Rate: 15
O2 Saturation: 95.1 %
PEEP: 5 cmH2O
Patient temperature: 37
pCO2 arterial: 40 mmHg (ref 32–48)
pH, Arterial: 7.32 — ABNORMAL LOW (ref 7.35–7.45)
pO2, Arterial: 73 mmHg — ABNORMAL LOW (ref 83–108)

## 2022-06-20 LAB — CBC
HCT: 26.3 % — ABNORMAL LOW (ref 36.0–46.0)
Hemoglobin: 8.5 g/dL — ABNORMAL LOW (ref 12.0–15.0)
MCH: 33.2 pg (ref 26.0–34.0)
MCHC: 32.3 g/dL (ref 30.0–36.0)
MCV: 102.7 fL — ABNORMAL HIGH (ref 80.0–100.0)
Platelets: 103 10*3/uL — ABNORMAL LOW (ref 150–400)
RBC: 2.56 MIL/uL — ABNORMAL LOW (ref 3.87–5.11)
RDW: 13.7 % (ref 11.5–15.5)
WBC: 16.9 10*3/uL — ABNORMAL HIGH (ref 4.0–10.5)
nRBC: 0.1 % (ref 0.0–0.2)

## 2022-06-20 LAB — MAGNESIUM
Magnesium: 1.8 mg/dL (ref 1.7–2.4)
Magnesium: 2.1 mg/dL (ref 1.7–2.4)

## 2022-06-20 LAB — PHOSPHORUS
Phosphorus: 1.9 mg/dL — ABNORMAL LOW (ref 2.5–4.6)
Phosphorus: 3 mg/dL (ref 2.5–4.6)

## 2022-06-20 MED ORDER — PHENOBARBITAL SODIUM 65 MG/ML IJ SOLN
32.5000 mg | Freq: Three times a day (TID) | INTRAMUSCULAR | Status: DC
  Administered 2022-06-24 – 2022-06-25 (×3): 32.5 mg via INTRAVENOUS
  Filled 2022-06-20 (×3): qty 1

## 2022-06-20 MED ORDER — PHENOBARBITAL SODIUM 65 MG/ML IJ SOLN
65.0000 mg | Freq: Three times a day (TID) | INTRAMUSCULAR | Status: AC
  Administered 2022-06-22 – 2022-06-24 (×6): 65 mg via INTRAVENOUS
  Filled 2022-06-20 (×6): qty 1

## 2022-06-20 MED ORDER — STERILE WATER FOR INJECTION IJ SOLN
INTRAMUSCULAR | Status: AC
  Administered 2022-06-20: 10 mL
  Filled 2022-06-20: qty 10

## 2022-06-20 MED ORDER — PHENOBARBITAL SODIUM 130 MG/ML IJ SOLN
97.5000 mg | Freq: Three times a day (TID) | INTRAMUSCULAR | Status: AC
  Administered 2022-06-20 – 2022-06-22 (×6): 97.5 mg via INTRAVENOUS
  Filled 2022-06-20 (×6): qty 1

## 2022-06-20 MED ORDER — MAGNESIUM SULFATE 2 GM/50ML IV SOLN
2.0000 g | Freq: Once | INTRAVENOUS | Status: AC
  Administered 2022-06-20: 2 g via INTRAVENOUS
  Filled 2022-06-20: qty 50

## 2022-06-20 MED ORDER — DEXTROSE 5 % IV SOLN
30.0000 mmol | Freq: Once | INTRAVENOUS | Status: AC
  Administered 2022-06-20: 30 mmol via INTRAVENOUS
  Filled 2022-06-20: qty 10

## 2022-06-20 MED ORDER — ALBUTEROL SULFATE (2.5 MG/3ML) 0.083% IN NEBU: 2.5000 mg | INHALATION_SOLUTION | RESPIRATORY_TRACT | Status: DC | PRN

## 2022-06-20 MED ORDER — PROPOFOL 1000 MG/100ML IV EMUL
5.0000 ug/kg/min | INTRAVENOUS | Status: DC
  Administered 2022-06-20: 5 ug/kg/min via INTRAVENOUS
  Administered 2022-06-21: 25 ug/kg/min via INTRAVENOUS
  Administered 2022-06-21: 20 ug/kg/min via INTRAVENOUS
  Filled 2022-06-20 (×3): qty 100

## 2022-06-20 MED ORDER — INSULIN ASPART 100 UNIT/ML IJ SOLN
0.0000 [IU] | INTRAMUSCULAR | Status: DC
  Administered 2022-06-20: 3 [IU] via SUBCUTANEOUS
  Administered 2022-06-21: 4 [IU] via SUBCUTANEOUS
  Administered 2022-06-21 (×3): 2 [IU] via SUBCUTANEOUS
  Administered 2022-06-22 – 2022-06-24 (×4): 1 [IU] via SUBCUTANEOUS
  Administered 2022-06-25: 2 [IU] via SUBCUTANEOUS
  Administered 2022-06-25 – 2022-06-26 (×2): 1 [IU] via SUBCUTANEOUS
  Administered 2022-06-26: 2 [IU] via SUBCUTANEOUS
  Administered 2022-06-27: 1 [IU] via SUBCUTANEOUS
  Filled 2022-06-20 (×14): qty 1

## 2022-06-20 MED ORDER — IPRATROPIUM-ALBUTEROL 0.5-2.5 (3) MG/3ML IN SOLN
3.0000 mL | Freq: Four times a day (QID) | RESPIRATORY_TRACT | Status: DC
  Administered 2022-06-20 – 2022-06-21 (×4): 3 mL via RESPIRATORY_TRACT
  Filled 2022-06-20 (×5): qty 3

## 2022-06-20 MED ORDER — STERILE WATER FOR INJECTION IJ SOLN
INTRAMUSCULAR | Status: AC
  Filled 2022-06-20: qty 10

## 2022-06-20 NOTE — Progress Notes (Signed)
ETT pulled back 3cm per NP order. ETT at 23 now.

## 2022-06-20 NOTE — IPAL (Signed)
  Interdisciplinary Goals of Care Family Meeting   Date carried out: 06/20/2022  Location of the meeting: Bedside  Member's involved: Physician, Nurse Practitioner, and Family Member or next of kin      GOALS OF CARE DISCUSSION  The Clinical status was relayed to family in detail-Mother and Father at bedside  Updated and notified of patients medical condition- Patient remains unresponsive and will not open eyes to command.   Patient is having a weak cough and struggling to remove secretions.   Patient with increased WOB and using accessory muscles to breathe Explained to family course of therapy and the modalities   Patient with Progressive multiorgan failure with a very high probablity of a very minimal chance of meaningful recovery despite all aggressive and optimal medical therapy.  PATIENT REMAINS FULL CODE Continue Vent support Continue IV abx Plan for SAT/SBT in 24-48 hrs  Family understands the situation.   Family are satisfied with Plan of action and management. All questions answered  Additional CC time 25 mins   Raciel Caffrey Santiago Glad, M.D.  [facility]  Medical Director [facility] Medical Director [facility] Cardio-Pulmonary Department

## 2022-06-20 NOTE — Progress Notes (Signed)
ID Pt remains intubated Sedated Eye lid swellign- scab left eye with discharge Facial swelling/ neck swelling better  BP (!) 148/94   Pulse 71   Temp 99 F (37.2 C) (Axillary)   Resp (!) 9   Ht 5\' 5"  (1.651 m)   Wt 84.1 kg   LMP 07/24/2016   SpO2 99%   BMI 30.85 kg/m   Chest b/l air entry Hs tachycardia Abd soft Cns cannot be tested  Labs    Latest Ref Rng & Units 06/20/2022    4:27 AM 06/19/2022    4:32 AM 06/18/2022    5:26 AM  CBC  WBC 4.0 - 10.5 K/uL 16.9  21.4  16.9   Hemoglobin 12.0 - 15.0 g/dL 8.5  8.6  9.0   Hematocrit 36.0 - 46.0 % 26.3  27.4  28.5   Platelets 150 - 400 K/uL 103  121  135        Latest Ref Rng & Units 06/20/2022    4:27 AM 06/19/2022    4:32 AM 06/18/2022    5:26 AM  CMP  Glucose 70 - 99 mg/dL 06/20/2022  264  91   BUN 6 - 20 mg/dL 18  12  14    Creatinine 0.44 - 1.00 mg/dL 158   3.09   Sodium 135 - 145 mmol/L 131  131  132   Potassium 3.5 - 5.1 mmol/L 4.1  4.3  3.6   Chloride 98 - 111 mmol/L 105  107  106   CO2 22 - 32 mmol/L 21  21  20    Calcium 8.9 - 10.3 mg/dL 8.0  8.0  7.6      Impression/recommendation  Group A streptococcus bacteremia with Toxic shock With facial cellulitis following an injury to forehead Also has facial hematoma on the forehead  Pt currently on PCN, linezolid Will finish IVIG X 3 doses today Await culture result from eye wound  Acute hypoxic  resp failure due to early air way compromise from swollen neck Intubated Hypotension has resolved  Leucocytosis - steroids could be contributing  has improved Renal function normal    Alcohol use disorder- on CIWA protocol  Discussed the management with , her nurse and the intensivists
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
## 2022-06-20 NOTE — TOC Initial Note (Signed)
Transition of Care [facility]) - Initial/Assessment Note    Patient Details  Name: Anna Lucas MRN: [id number] Date of Birth: 07-21-1971  Transition of Care [facility]) CM/SW Contact:    Anna Butcher, RN Phone Number: 06/20/2022, 11:15 AM  Clinical Narrative:                 Patient admitted to the hospital for severe sepsis currently intubated and sedated.  Significant facial and neck swelling.  Patient fell at work on 6/23 and was seen at [facility], evaluated and sent home to follow up with ortho outpatient.  Patient returned to the [facility] ED on 6/24 for increasing pain and facial swelling.  Patient is from home, lives alone, independent.  Mother and father live at the [place], [place].  Patient has a son that lives in [place].  Patient's son declines to make medical decisions so the parents will be primary contact.  TOC will follow hospital progression.   Expected Discharge Plan:  (TBD) Barriers to Discharge: Continued Medical Work up   Patient Goals and CMS Choice Patient states their goals for this hospitalization and ongoing recovery are:: Patient unable to state- intubated      Expected Discharge Plan and Services Expected Discharge Plan:  (TBD)   Discharge Planning Services: CM Consult   Living arrangements for the past 2 months: Single Family Home                 DME Arranged: N/A DME Agency: NA                  Prior Living Arrangements/Services Living arrangements for the past 2 months: Single Family Home Lives with:: Self Patient language and need for interpreter reviewed:: Yes        Need for Family Participation in Patient Care: Yes (Comment) Care giver support system in place?: Yes (comment) (parents and son)   Criminal Activity/Legal Involvement Pertinent to Current Situation/Hospitalization: No - Comment as needed  Activities of Daily Living      Permission Sought/Granted Permission sought to share information with : Family Supports    Share  Information with NAME: Anna Lucas     Permission granted to share info w Relationship: father  Permission granted to share info w Contact Information: [phone]  Emotional Assessment Appearance:: Appears stated age Attitude/Demeanor/Rapport: Intubated (Following Commands or Not Following Commands) Affect (typically observed): Unable to Assess   Alcohol / Substance Use: Alcohol Use Psych Involvement: No (comment)  Admission diagnosis:  Facial swelling [R22.0] CAP (Anna acquired pneumonia) [J18.9] Fall, initial encounter [W19.XXXA] Sepsis (HCC) [A41.9] Anna acquired pneumonia, unspecified laterality [J18.9] Sepsis, due to unspecified organism, unspecified whether acute organ dysfunction present [facility]) [A41.9] Patient Active Problem List   Diagnosis Date Noted   Fall    CAP (Anna acquired pneumonia) 06/17/2022   Severe sepsis (HCC) 06/17/2022   Hypokalemia 06/17/2022   Facial swelling    Sepsis due to Streptococcus pyogenes (HCC)    Elevated ferritin 05/13/2022   Hypomagnesemia 05/12/2022   Hypoalbuminemia 05/12/2022   Leukopenia 05/12/2022   Myelosuppression 05/12/2022   Acute hepatitis 05/11/2022   Anxiety and depression 05/11/2022   GERD (gastroesophageal reflux disease) 05/11/2022   Peripheral neuropathy 05/11/2022   Alcohol abuse 05/11/2022   Thrombocytopenia (HCC) 05/11/2022   Colon cancer screening 02/07/2021   Intractable vomiting 08/17/2016   Pyelonephritis 12/19/2015   PCP:  [employer] [facility] [employer] Pharmacy:   [facility] - [place], [place] - [address] [place] [address] Phone: [phone] Fax: [phone]     Social Determinants of Health (SDOH) Interventions    Readmission Risk Interventions     No data to display

## 2022-06-20 NOTE — Progress Notes (Signed)
PHARMACY CONSULT NOTE  Pharmacy Consult for Electrolyte Monitoring and Replacement   Recent Labs: Potassium (mmol/L)  Date Value  06/20/2022 4.1  10/05/2013 3.9   Magnesium (mg/dL)  Date Value  14/48/1856 1.8   Calcium (mg/dL)  Date Value  31/49/7026 8.0 (L)   Calcium, Total (mg/dL)  Date Value  37/85/8850 9.5   Albumin (g/dL)  Date Value  27/74/1287 2.4 (L)  10/05/2013 4.4   Phosphorus (mg/dL)  Date Value  86/76/7209 1.9 (L)   Sodium (mmol/L)  Date Value  06/20/2022 131 (L)  10/05/2013 134 (L)   Corrected Ca: 9.3 mg/dL  Assessment: 70 YOF w/ PMH of chronic EtOH abuse, chronic thrombocytopenia, hyponatremia, anxiety and depression, PTSD, and hypertension who presented with chief complaints of worsening periorbital swelling and pain status post fall on 6/23. Pharmacy is asked to follow and replace electrolytes while in CCU  Patient is currently intubated on ventilator support w/ OGT   Nutrition: Vital AF 1.2 @  60 ml/hr   30 ml free water flush every 4 hours to maintain tube patency  Goal of Therapy:  Electrolytes WNL  Plan:  30 mmol IV sodium phosphate x 1 2 grams IV magnesium sulfate x 1 Recheck electrolytes in am  Lowella Bandy ,PharmD Clinical Pharmacist 06/20/2022 7:03 AM

## 2022-06-21 DIAGNOSIS — A419 Sepsis, unspecified organism: Secondary | ICD-10-CM | POA: Diagnosis not present

## 2022-06-21 DIAGNOSIS — A483 Toxic shock syndrome: Secondary | ICD-10-CM | POA: Diagnosis not present

## 2022-06-21 DIAGNOSIS — R6520 Severe sepsis without septic shock: Secondary | ICD-10-CM | POA: Diagnosis not present

## 2022-06-21 DIAGNOSIS — A491 Streptococcal infection, unspecified site: Secondary | ICD-10-CM | POA: Diagnosis not present

## 2022-06-21 LAB — CBC
HCT: 24.8 % — ABNORMAL LOW (ref 36.0–46.0)
Hemoglobin: 7.8 g/dL — ABNORMAL LOW (ref 12.0–15.0)
MCH: 32.2 pg (ref 26.0–34.0)
MCHC: 31.5 g/dL (ref 30.0–36.0)
MCV: 102.5 fL — ABNORMAL HIGH (ref 80.0–100.0)
Platelets: 101 10*3/uL — ABNORMAL LOW (ref 150–400)
RBC: 2.42 MIL/uL — ABNORMAL LOW (ref 3.87–5.11)
RDW: 13.8 % (ref 11.5–15.5)
WBC: 12.8 10*3/uL — ABNORMAL HIGH (ref 4.0–10.5)
nRBC: 0.4 % — ABNORMAL HIGH (ref 0.0–0.2)

## 2022-06-21 LAB — BASIC METABOLIC PANEL
Anion gap: 4 — ABNORMAL LOW (ref 5–15)
BUN: 17 mg/dL (ref 6–20)
CO2: 25 mmol/L (ref 22–32)
Calcium: 7.7 mg/dL — ABNORMAL LOW (ref 8.9–10.3)
Chloride: 107 mmol/L (ref 98–111)
Creatinine, Ser: 0.63 mg/dL (ref 0.44–1.00)
GFR, Estimated: >60 mL/min (ref >60)
Glucose, Bld: 195 mg/dL — ABNORMAL HIGH (ref 70–99)
Potassium: 3.4 mmol/L — ABNORMAL LOW (ref 3.5–5.1)
Sodium: 136 mmol/L (ref 135–145)

## 2022-06-21 LAB — HEPATIC FUNCTION PANEL
ALT: 19 U/L (ref 0–44)
AST: 15 U/L (ref 15–41)
Albumin: 1.9 g/dL — ABNORMAL LOW (ref 3.5–5.0)
Alkaline Phosphatase: 99 U/L (ref 38–126)
Bilirubin, Direct: <0.1 mg/dL (ref 0.0–0.2)
Total Bilirubin: 0.2 mg/dL — ABNORMAL LOW (ref 0.3–1.2)
Total Protein: 7.3 g/dL (ref 6.5–8.1)

## 2022-06-21 LAB — GLUCOSE, CAPILLARY
Glucose-Capillary: 194 mg/dL — ABNORMAL HIGH (ref 70–99)
Glucose-Capillary: 184 mg/dL — ABNORMAL HIGH (ref 70–99)
Glucose-Capillary: 159 mg/dL — ABNORMAL HIGH (ref 70–99)
Glucose-Capillary: 109 mg/dL — ABNORMAL HIGH (ref 70–99)
Glucose-Capillary: 104 mg/dL — ABNORMAL HIGH (ref 70–99)
Glucose-Capillary: 133 mg/dL — ABNORMAL HIGH (ref 70–99)

## 2022-06-21 LAB — TRIGLYCERIDES: Triglycerides: 98 mg/dL (ref <150)

## 2022-06-21 LAB — PHOSPHORUS: Phosphorus: 1.5 mg/dL — ABNORMAL LOW (ref 2.5–4.6)

## 2022-06-21 LAB — MAGNESIUM: Magnesium: 1.8 mg/dL (ref 1.7–2.4)

## 2022-06-21 MED ORDER — ERYTHROMYCIN 5 MG/GM OP OINT
TOPICAL_OINTMENT | Freq: Four times a day (QID) | OPHTHALMIC | Status: DC
Start: 2022-06-21 — End: 2022-06-28
  Administered 2022-06-21 – 2022-06-28 (×27): 1 via OPHTHALMIC
  Filled 2022-06-21 (×5): qty 1

## 2022-06-21 MED ORDER — THIAMINE HCL 100 MG/ML IJ SOLN: 100.0000 mg | Freq: Every day | INTRAMUSCULAR | Status: DC

## 2022-06-21 MED ORDER — CITALOPRAM HYDROBROMIDE 20 MG PO TABS
40.0000 mg | ORAL_TABLET | Freq: Every day | ORAL | Status: DC
  Administered 2022-06-22 – 2022-06-28 (×7): 40 mg via ORAL
  Filled 2022-06-21 (×7): qty 2

## 2022-06-21 MED ORDER — METHYLPREDNISOLONE SODIUM SUCC 40 MG IJ SOLR
40.0000 mg | Freq: Every day | INTRAMUSCULAR | Status: DC
  Administered 2022-06-22 – 2022-06-25 (×4): 40 mg via INTRAVENOUS
  Filled 2022-06-21 (×4): qty 1

## 2022-06-21 MED ORDER — MORPHINE SULFATE (PF) 2 MG/ML IV SOLN
1.0000 mg | INTRAVENOUS | Status: DC | PRN
  Administered 2022-06-21: 2 mg via INTRAVENOUS
  Administered 2022-06-21: 1 mg via INTRAVENOUS
  Administered 2022-06-21 – 2022-06-26 (×16): 2 mg via INTRAVENOUS
  Filled 2022-06-21 (×18): qty 1

## 2022-06-21 MED ORDER — ACETAMINOPHEN 650 MG RE SUPP: 650.0000 mg | Freq: Four times a day (QID) | RECTAL | Status: DC | PRN

## 2022-06-21 MED ORDER — POTASSIUM CHLORIDE 20 MEQ PO PACK
40.0000 meq | PACK | Freq: Once | ORAL | Status: AC
  Administered 2022-06-21: 40 meq
  Filled 2022-06-21: qty 2

## 2022-06-21 MED ORDER — ADULT MULTIVITAMIN W/MINERALS CH
1.0000 | ORAL_TABLET | Freq: Every day | ORAL | Status: DC
  Administered 2022-06-22 – 2022-06-28 (×7): 1 via ORAL
  Filled 2022-06-21 (×7): qty 1

## 2022-06-21 MED ORDER — ERYTHROMYCIN 5 MG/GM OP OINT
TOPICAL_OINTMENT | Freq: Four times a day (QID) | OPHTHALMIC | Status: DC
  Filled 2022-06-21: qty 1

## 2022-06-21 MED ORDER — POTASSIUM PHOSPHATES 15 MMOLE/5ML IV SOLN
30.0000 mmol | Freq: Once | INTRAVENOUS | Status: AC
  Administered 2022-06-21: 30 mmol via INTRAVENOUS
  Filled 2022-06-21: qty 10

## 2022-06-21 MED ORDER — ENOXAPARIN SODIUM 40 MG/0.4ML IJ SOSY
40.0000 mg | PREFILLED_SYRINGE | Freq: Every day | INTRAMUSCULAR | Status: DC
  Administered 2022-06-21 – 2022-06-27 (×7): 40 mg via SUBCUTANEOUS
  Filled 2022-06-21 (×7): qty 0.4

## 2022-06-21 MED ORDER — DOCUSATE SODIUM 100 MG PO CAPS
100.0000 mg | ORAL_CAPSULE | Freq: Two times a day (BID) | ORAL | Status: DC
  Administered 2022-06-27 – 2022-06-28 (×2): 100 mg via ORAL
  Filled 2022-06-21 (×7): qty 1

## 2022-06-21 MED ORDER — MAGNESIUM SULFATE 2 GM/50ML IV SOLN
2.0000 g | Freq: Once | INTRAVENOUS | Status: AC
Start: 2022-06-21 — End: 2022-06-21
  Administered 2022-06-21: 2 g via INTRAVENOUS
  Filled 2022-06-21: qty 50

## 2022-06-21 MED ORDER — POLYETHYLENE GLYCOL 3350 17 G PO PACK: 17.0000 g | PACK | Freq: Every day | ORAL | Status: DC

## 2022-06-21 MED ORDER — ACETAMINOPHEN 325 MG PO TABS
650.0000 mg | ORAL_TABLET | Freq: Four times a day (QID) | ORAL | Status: DC | PRN
  Administered 2022-06-21 – 2022-06-24 (×4): 650 mg via ORAL
  Filled 2022-06-21 (×4): qty 2

## 2022-06-21 MED ORDER — PANTOPRAZOLE SODIUM 40 MG PO TBEC
40.0000 mg | DELAYED_RELEASE_TABLET | Freq: Every day | ORAL | Status: DC
  Administered 2022-06-22 – 2022-06-28 (×7): 40 mg via ORAL
  Filled 2022-06-21 (×7): qty 1

## 2022-06-21 MED ORDER — POTASSIUM & SODIUM PHOSPHATES 280-160-250 MG PO PACK: 1.0000 | PACK | Freq: Three times a day (TID) | ORAL | Status: DC

## 2022-06-21 MED ORDER — GABAPENTIN 300 MG PO CAPS
300.0000 mg | ORAL_CAPSULE | Freq: Every day | ORAL | Status: DC
  Administered 2022-06-22 – 2022-06-28 (×7): 300 mg via ORAL
  Filled 2022-06-21 (×7): qty 1

## 2022-06-21 MED ORDER — FOLIC ACID 1 MG PO TABS
1.0000 mg | ORAL_TABLET | Freq: Every day | ORAL | Status: DC
  Administered 2022-06-22 – 2022-06-28 (×7): 1 mg via ORAL
  Filled 2022-06-21 (×7): qty 1

## 2022-06-21 MED ORDER — THIAMINE HCL 100 MG PO TABS
100.0000 mg | ORAL_TABLET | Freq: Every day | ORAL | Status: DC
  Administered 2022-06-22 – 2022-06-28 (×7): 100 mg via ORAL
  Filled 2022-06-21 (×7): qty 1

## 2022-06-21 MED ORDER — HYDROMORPHONE HCL 1 MG/ML IJ SOLN
1.0000 mg | Freq: Once | INTRAMUSCULAR | Status: AC
  Administered 2022-06-21: 1 mg via INTRAVENOUS
  Filled 2022-06-21: qty 1

## 2022-06-21 MED ORDER — DEXMEDETOMIDINE HCL IN NACL 400 MCG/100ML IV SOLN
0.4000 ug/kg/h | INTRAVENOUS | Status: DC
  Administered 2022-06-21 – 2022-06-22 (×2): 0.4 ug/kg/h via INTRAVENOUS
  Administered 2022-06-22: 0.6 ug/kg/h via INTRAVENOUS
  Filled 2022-06-21 (×3): qty 100

## 2022-06-21 NOTE — Progress Notes (Signed)
SBT attempted - pt did not initiate any spontaneous breaths
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
## 2022-06-21 NOTE — Progress Notes (Signed)
PHARMACY CONSULT NOTE  Pharmacy Consult for Electrolyte Monitoring and Replacement   Recent Labs: Potassium (mmol/L)  Date Value  06/21/2022 3.4 (L)  10/05/2013 3.9   Magnesium (mg/dL)  Date Value  68/02/2121 2.1   Calcium (mg/dL)  Date Value  48/25/0037 7.7 (L)   Calcium, Total (mg/dL)  Date Value  04/88/8916 9.5   Albumin (g/dL)  Date Value  94/50/3888 2.4 (L)  10/05/2013 4.4   Phosphorus (mg/dL)  Date Value  28/00/3491 3.0   Sodium (mmol/L)  Date Value  06/21/2022 136  10/05/2013 134 (L)   Assessment:  50 YOF w/ PMH of chronic EtOH abuse, chronic thrombocytopenia, hyponatremia, anxiety and depression, PTSD, and hypertension who presented with chief complaints of worsening periorbital swelling and pain status post fall on 6/23. Pharmacy is asked to follow and replace electrolytes while in CCU  Extubated 6/29  Goal of Therapy:  Electrolytes within normal limits  Plan:  --K 3.4, Kcl 40 mEq per tube x 1 per PCCM --Phos 1.5, decreased from 1.9 yesterday despite replacement with sodium phosphate 30 mmol IV x 1. Will give potassium phosphate 30 mmol x 1 today (contains ~45 mEq K+) --Follow-up electrolytes with AM labs tomorrow  Tressie Ellis 06/21/2022 7:55 AM

## 2022-06-21 NOTE — Progress Notes (Signed)
Contacted Ophthalmologist Dr. Inez Pilgrim via secure chat in epic regarding pts inability to open her left eye due to scabbed over crusted eyelid.  Dr. Inez Pilgrim recommended applying erythromycin ophthalmic to the eyelids QID.  Orders placed per recommendations.   Zada Girt, AGNP  Pulmonary/Critical Care Pager [phone] (please enter 7 digits) PCCM Consult Pager [phone] (please enter 7 digits)

## 2022-06-21 NOTE — Progress Notes (Signed)
ID Pt got extubated this afternnon Talking Alert Tearful  Eye lid swellign- less- iopening rt eye, left eye upperlid erythematous, scabs with discharge Facial swelling/ neck swelling better But now it is focuse don the rt side below the mandible  Patient Vitals for the past 24 hrs:  BP Temp Temp src Pulse Resp SpO2 Weight  06/21/22 1330 (!) 139/104 -- -- 89 15 98 % --  06/21/22 1315 127/85 -- -- 85 (!) 21 99 % --  06/21/22 1300 132/89 -- -- 90 18 99 % --  06/21/22 1245 131/80 -- -- 80 14 99 % --  06/21/22 1230 129/83 -- -- 93 19 99 % --  06/21/22 1215 122/76 -- -- 87 16 99 % --  06/21/22 1200 129/81 99.1 F (37.3 C) Oral 86 14 98 % --  06/21/22 1145 124/76 -- -- 84 14 98 % --  06/21/22 1130 127/76 -- -- 90 11 96 % --  06/21/22 1128 -- -- -- -- -- 97 % --  06/21/22 1115 126/75 -- -- 95 20 97 % --  06/21/22 1100 (!) 150/94 -- -- (!) 118 (!) 28 97 % --  06/21/22 1045 (!) 146/92 -- -- (!) 106 19 99 % --  06/21/22 1030 (!) 145/88 -- -- (!) 102 13 98 % --  06/21/22 0930 (!) 141/90 -- -- 95 19 99 % --  06/21/22 0915 (!) 146/90 -- -- (!) 105 (!) 23 99 % --  06/21/22 0900 (!) 142/95 -- -- (!) 105 13 99 % --  06/21/22 0815 (!) 147/82 -- -- (!) 103 18 98 % --  06/21/22 0813 (!) 147/82 -- -- (!) 103 16 98 % --  06/21/22 0800 (!) 143/87 99.5 F (37.5 C) Axillary (!) 101 13 99 % --  06/21/22 0759 (!) 143/87 -- -- (!) 103 (!) 21 99 % --  06/21/22 0745 (!) 143/86 -- -- 98 11 99 % --  06/21/22 0730 (!) 141/84 -- -- 95 17 98 % --  06/21/22 0500 -- -- -- -- -- -- 87.9 kg  06/21/22 0400 (!) 147/86 -- -- (!) 102 10 99 % --  06/21/22 0300 133/73 -- -- 96 10 99 % --  06/21/22 0238 -- -- -- -- -- 99 % --  06/21/22 0200 125/81 -- -- 83 18 99 % --  06/21/22 0000 133/83 -- -- 87 11 99 % --  06/20/22 2300 123/78 -- -- 86 15 99 % --  06/20/22 2200 127/86 -- -- (!) 103 15 99 % --  06/20/22 2100 112/69 -- -- 85 15 96 % --  06/20/22 2000 119/81 98.7 F (37.1 C) Axillary 77 15 98 % --  06/20/22 1900  114/81 -- -- 70 13 97 % --  06/20/22 1800 110/77 -- -- 71 13 98 % --  06/20/22 1745 110/80 -- -- 71 (!) 9 98 % --  06/20/22 1730 109/77 -- -- 72 10 98 % --  06/20/22 1716 112/78 -- -- 75 (!) 7 100 % --  06/20/22 1715 112/78 -- -- 75 (!) 8 96 % --  06/20/22 1645 (!) 127/97 -- -- 75 15 100 % --  06/20/22 1630 108/77 -- -- 69 14 99 % --  06/20/22 1615 110/77 -- -- 66 15 98 % --  06/20/22 1600 107/75 -- -- 66 15 98 % --  06/20/22 1554 -- -- -- -- -- 98 % --  06/20/22 1545 105/73 -- -- 69 15 99 % --   Chest b/l  air entry Hs tachycardia Abd soft Cns alert, moves all limbs  Labs    Latest Ref Rng & Units 06/21/2022    5:03 AM 06/20/2022    4:27 AM 06/19/2022    4:32 AM  CBC  WBC 4.0 - 10.5 K/uL 12.8  16.9  21.4   Hemoglobin 12.0 - 15.0 g/dL 7.8  8.5  8.6   Hematocrit 36.0 - 46.0 % 24.8  26.3  27.4   Platelets 150 - 400 K/uL 101  103  121        Latest Ref Rng & Units 06/21/2022    5:03 AM 06/20/2022    4:27 AM 06/19/2022    4:32 AM  CMP  Glucose 70 - 99 mg/dL 163  846  659   BUN 6 - 20 mg/dL 17  18  12    Creatinine 0.44 - 1.00 mg/dL  9.35  7.01   Sodium 135 - 145 mmol/L 136  131  131   Potassium 3.5 - 5.1 mmol/L 3.4  4.1  4.3   Chloride 98 - 111 mmol/L 107  105  107   CO2 22 - 32 mmol/L 25  21  21    Calcium 8.9 - 10.3 mg/dL 7.7  8.0  8.0   Total Protein 6.5 - 8.1 g/dL 7.3     Total Bilirubin 0.3 - 1.2 mg/dL 0.2     Alkaline Phos 38 - 126 U/L 99     AST 15 - 41 U/L 15     ALT 0 - 44 U/L 19        Impression/recommendation  Group A streptococcus bacteremia with Toxic shock With facial cellulitis following an injury to forehead Also has facial hematoma on the forehead Swelling over the neck better but now focuse don the rt side May need imaging if it gets worse   Pt currently on PCN, linezolid( day 4) Completed h IVIG X 3 doses  Will DC linezolid today as antitoxin effect not needed any more   Acute hypoxic  resp failure due to early air way compromise from swollen  neck Has been extubated  Leucocytosis - much better Start tapering  steroids  Renal function normal  Anemia  Alcohol use disorder-   Discussed the management with patient  , her nurse

## 2022-06-22 DIAGNOSIS — G9341 Metabolic encephalopathy: Secondary | ICD-10-CM | POA: Diagnosis not present

## 2022-06-22 DIAGNOSIS — F10931 Alcohol use, unspecified with withdrawal delirium: Secondary | ICD-10-CM | POA: Diagnosis not present

## 2022-06-22 DIAGNOSIS — F1010 Alcohol abuse, uncomplicated: Secondary | ICD-10-CM | POA: Diagnosis not present

## 2022-06-22 DIAGNOSIS — B955 Unspecified streptococcus as the cause of diseases classified elsewhere: Secondary | ICD-10-CM | POA: Diagnosis not present

## 2022-06-22 DIAGNOSIS — E669 Obesity, unspecified: Secondary | ICD-10-CM | POA: Diagnosis present

## 2022-06-22 DIAGNOSIS — L03211 Cellulitis of face: Secondary | ICD-10-CM | POA: Diagnosis not present

## 2022-06-22 DIAGNOSIS — A400 Sepsis due to streptococcus, group A: Secondary | ICD-10-CM | POA: Diagnosis not present

## 2022-06-22 DIAGNOSIS — A491 Streptococcal infection, unspecified site: Secondary | ICD-10-CM | POA: Diagnosis not present

## 2022-06-22 LAB — BASIC METABOLIC PANEL
Anion gap: 7 (ref 5–15)
BUN: 13 mg/dL (ref 6–20)
CO2: 25 mmol/L (ref 22–32)
Calcium: 8.3 mg/dL — ABNORMAL LOW (ref 8.9–10.3)
Chloride: 101 mmol/L (ref 98–111)
Creatinine, Ser: 0.55 mg/dL (ref 0.44–1.00)
GFR, Estimated: >60 mL/min (ref >60)
Glucose, Bld: 101 mg/dL — ABNORMAL HIGH (ref 70–99)
Potassium: 3.8 mmol/L (ref 3.5–5.1)
Sodium: 133 mmol/L — ABNORMAL LOW (ref 135–145)

## 2022-06-22 LAB — GLUCOSE, CAPILLARY
Glucose-Capillary: 103 mg/dL — ABNORMAL HIGH (ref 70–99)
Glucose-Capillary: 85 mg/dL (ref 70–99)
Glucose-Capillary: 97 mg/dL (ref 70–99)
Glucose-Capillary: 78 mg/dL (ref 70–99)
Glucose-Capillary: 89 mg/dL (ref 70–99)
Glucose-Capillary: 75 mg/dL (ref 70–99)

## 2022-06-22 LAB — CBC
HCT: 26.4 % — ABNORMAL LOW (ref 36.0–46.0)
Hemoglobin: 8.5 g/dL — ABNORMAL LOW (ref 12.0–15.0)
MCH: 32.1 pg (ref 26.0–34.0)
MCHC: 32.2 g/dL (ref 30.0–36.0)
MCV: 99.6 fL (ref 80.0–100.0)
Platelets: 111 10*3/uL — ABNORMAL LOW (ref 150–400)
RBC: 2.65 MIL/uL — ABNORMAL LOW (ref 3.87–5.11)
RDW: 13.9 % (ref 11.5–15.5)
WBC: 7.7 10*3/uL (ref 4.0–10.5)
nRBC: 1.4 % — ABNORMAL HIGH (ref 0.0–0.2)

## 2022-06-22 LAB — MAGNESIUM: Magnesium: 1.7 mg/dL (ref 1.7–2.4)

## 2022-06-22 LAB — PHOSPHORUS: Phosphorus: 2.1 mg/dL — ABNORMAL LOW (ref 2.5–4.6)

## 2022-06-22 LAB — HEMOGLOBIN A1C
Hgb A1c MFr Bld: 5.1 % (ref 4.8–5.6)
Mean Plasma Glucose: 100 mg/dL

## 2022-06-22 LAB — AMMONIA: Ammonia: 15 umol/L (ref 9–35)

## 2022-06-22 MED ORDER — LORAZEPAM 2 MG/ML IJ SOLN
0.2500 mg | Freq: Four times a day (QID) | INTRAMUSCULAR | Status: DC | PRN
  Administered 2022-06-22 – 2022-06-26 (×8): 0.25 mg via INTRAVENOUS
  Filled 2022-06-22 (×8): qty 1

## 2022-06-22 MED ORDER — PNEUMOCOCCAL 20-VAL CONJ VACC 0.5 ML IM SUSY
0.5000 mL | PREFILLED_SYRINGE | INTRAMUSCULAR | Status: AC
  Administered 2022-06-25: 0.5 mL via INTRAMUSCULAR
  Filled 2022-06-22 (×3): qty 0.5

## 2022-06-22 MED ORDER — MAGNESIUM SULFATE 2 GM/50ML IV SOLN
2.0000 g | Freq: Once | INTRAVENOUS | Status: AC
Start: 2022-06-22 — End: 2022-06-22
  Administered 2022-06-22: 2 g via INTRAVENOUS
  Filled 2022-06-22: qty 50

## 2022-06-22 MED ORDER — POTASSIUM & SODIUM PHOSPHATES 280-160-250 MG PO PACK
1.0000 | PACK | Freq: Three times a day (TID) | ORAL | Status: AC
  Administered 2022-06-22 (×3): 1 via ORAL
  Filled 2022-06-22 (×3): qty 1

## 2022-06-22 NOTE — Assessment & Plan Note (Signed)
Patient met criteria for severe sepsis on admission given leukocytosis, tachycardia, tachypnea with source being facial cellulitis cellulitis, and lactic acidosis.  Infectious disease following.  Sepsis is now stabilized.  Initially continued on penicillin and Zyvox stopped.  Tissue cultures grew out penicillin resistant strep hominis and epidermis.  Zyvox restarted and penicillin stopped.  Complete 7 days of Zyvox on 7/8.

## 2022-06-22 NOTE — Hospital Course (Signed)
52 year old female with past medical history of PTSD, hypertension and chronic alcohol abuse with secondary thrombocytopenia who presented to the emergency room at [facility] on 6/23 after a fall and at that time had sustained a left occipital condyle fracture as well as right frontal scalp hematoma and right forehead scalp laceration.  Patient was seen and repaired by plastic surgery in the emergency room and discharged on Augmentin.  She presented to [facility] emergency room on 6/24 with worsening periorbital swelling and pain.  Lab work was unremarkable however CT of the chest/abdomen/pelvis noted probable pneumonia patient was admitted to the hospitalist service for sepsis from pneumonia and started on broad-spectrum antibiotics.  Patient was seen by ophthalmology for her facial hematoma recommended ice packs.  Blood cultures at that time came back positive for strep pyogenes, felt to be secondary to facial cellulitis.  Infectious disease consulted.  On the night of 6/25 patient became hypotensive and then confused as well as having fevers and then transferred to the ICU for Precedex drip.  Due to obstructive neck edema patient was intubated for airway protection.  Decompensation for to be secondary to alcohol withdrawal.  Patient able to be successfully extubated on 6/29 and transferred to hospitalist service starting 6/30.

## 2022-06-22 NOTE — Assessment & Plan Note (Signed)
Off of Precedex.  Extubated from ventilator, more for airway protection from edema from facial trauma

## 2022-06-22 NOTE — Assessment & Plan Note (Signed)
Replacing as needed 

## 2022-06-22 NOTE — Evaluation (Addendum)
Physical Therapy Evaluation Patient Details Name: Anna Lucas MRN: [id number] DOB: [birth year] Today's Date: 06/22/2022  History of Present Illness  Pt is a 51 y/o F admitted on 06/16/22 after presenting to the ED with c/o worsening periorbital swelling & pain s/p fall on 06/15/22 (where pt found to have L occipital condyle fx). CT showed probable PNA & was started on antibiotics. On 06/17/22 pt was noted to be hypotensive & agitated with worsening mental status so tranferred to ICU. Pt was intubated 06/18/22 & extubated 06/21/22. PMH: EtOH abuse, chronic thrombocytopenia, hyponatremia, anxiety, depression, PTSD, HTN, hepatitis  Clinical Impression  Pt seen for PT evaluation with co-tx with OT. Pt oriented to self only & requires extra time/cuing to follow commands throughout session. Pt requires +1 assistance to transfer supine<>sit & STS from EOB with HHA +2 & take a few side steps to L at EOB. Pt with impaired vision 2/2 B eyelid swelling & barely able to open eyes. Will continue to follow pt acutely to address balance, endurance, and gait with LRAD.   Addendum: unable to locate cervical collar in pt's room & per chart, pt was declining wearing it when she was on other unit. Sent secure chat to MD requesting clarification re: cervical being necessary or for comfort to determine if therapists need to reinforce use of brace, but still awaiting his response.   Recommendations for follow up therapy are one component of a multi-disciplinary discharge planning process, led by the attending physician.  Recommendations may be updated based on patient status, additional functional criteria and insurance authorization.  Follow Up Recommendations Acute inpatient rehab (3hours/day)      Assistance Recommended at Discharge Frequent or constant Supervision/Assistance  Patient can return home with the following  Two people to help with walking and/or transfers;Two people to help with  bathing/dressing/bathroom;Direct supervision/assist for medications management;Help with stairs or ramp for entrance;Assist for transportation;Assistance with cooking/housework;Direct supervision/assist for financial management;Assistance with feeding    Equipment Recommendations None recommended by PT  Recommendations for Other Services  Rehab consult    Functional Status Assessment Patient has had a recent decline in their functional status and demonstrates the ability to make significant improvements in function in a reasonable and predictable amount of time.     Precautions / Restrictions Precautions Precautions: Fall;Cervical Required Braces or Orthoses: Cervical Brace Restrictions Weight Bearing Restrictions: No      Mobility  Bed Mobility Overal bed mobility: Needs Assistance Bed Mobility: Supine to Sit, Sit to Supine     Supine to sit: Min assist, HOB elevated Sit to supine: Min assist, HOB elevated   General bed mobility comments: bed rails, cuing to initiate    Transfers Overall transfer level: Needs assistance Equipment used: 2 person hand held assist Transfers: Sit to/from Stand Sit to Stand: Mod assist, Min assist           General transfer comment: STS from EOB with BUE HHA    Ambulation/Gait                  Stairs            Wheelchair Mobility    Modified Rankin (Stroke Patients Only)       Balance Overall balance assessment: Needs assistance Sitting-balance support: Feet supported, Bilateral upper extremity supported Sitting balance-Leahy Scale: Fair Sitting balance - Comments: close supervision static sitting   Standing balance support: Bilateral upper extremity supported, During functional activity Standing balance-Leahy Scale: Poor  Pertinent Vitals/Pain Pain Assessment Pain Assessment: Faces Faces Pain Scale: Hurts a little bit Pain Location: generalized Pain Descriptors /  Indicators: Discomfort Pain Intervention(s): Monitored during session    Home Living Family/patient expects to be discharged to:: Unsure (Pt unable to provide information 2/2 impaired cognition)                        Prior Function               Mobility Comments: Pt unable to provide information 2/2 impaired cognition, was working ADLs Comments: does not drive (per son)     Hand Dominance        Extremity/Trunk Assessment   Upper Extremity Assessment Upper Extremity Assessment: Difficult to assess due to impaired cognition;Generalized weakness    Lower Extremity Assessment Lower Extremity Assessment: Difficult to assess due to impaired cognition;Generalized weakness    Cervical / Trunk Assessment Cervical / Trunk Assessment:  (B eye swelling, barely able to open eyes)  Communication   Communication: No difficulties  Cognition Arousal/Alertness: Awake/alert Behavior During Therapy: Flat affect Overall Cognitive Status: Impaired/Different from baseline Area of Impairment: Orientation, Attention, Memory, Following commands, Safety/judgement, Awareness, Problem solving                 Orientation Level: Disoriented to, Place, Time, Situation   Memory: Decreased recall of precautions, Decreased short-term memory Following Commands: Follows one step commands inconsistently, Follows one step commands with increased time Safety/Judgement: Decreased awareness of deficits, Decreased awareness of safety Awareness: Intellectual Problem Solving: Slow processing, Decreased initiation, Requires tactile cues, Requires verbal cues, Difficulty sequencing          General Comments General comments (skin integrity, edema, etc.): BP EOB 152/133 mmHg MAP 141 in RUE but re-checked & 121/106 mmHg MAP 112    Exercises     Assessment/Plan    PT Assessment Patient needs continued PT services  PT Problem List Decreased strength;Decreased coordination;Decreased  cognition;Decreased activity tolerance;Decreased balance;Decreased safety awareness;Decreased mobility;Decreased knowledge of precautions;Decreased skin integrity;Decreased knowledge of use of DME;Pain       PT Treatment Interventions DME instruction;Therapeutic exercise;Gait training;Balance training;Stair training;Neuromuscular re-education;Modalities;Manual techniques;Functional mobility training;Cognitive remediation;Therapeutic activities;Patient/family education    PT Goals (Current goals can be found in the Care Plan section)  Acute Rehab PT Goals Patient Stated Goal: none satted PT Goal Formulation: With patient Time For Goal Achievement: 07/06/22 Potential to Achieve Goals: Fair    Frequency 7X/week     Co-evaluation PT/OT/SLP Co-Evaluation/Treatment: Yes Reason for Co-Treatment: Complexity of the patient's impairments (multi-system involvement);Necessary to address cognition/behavior during functional activity;For patient/therapist safety;To address functional/ADL transfers PT goals addressed during session: Mobility/safety with mobility;Balance         AM-PAC PT "6 Clicks" Mobility  Outcome Measure Help needed turning from your back to your side while in a flat bed without using bedrails?: A Little Help needed moving from lying on your back to sitting on the side of a flat bed without using bedrails?: A Little Help needed moving to and from a bed to a chair (including a wheelchair)?: A Lot Help needed standing up from a chair using your arms (e.g., wheelchair or bedside chair)?: A Lot Help needed to walk in hospital room?: Total Help needed climbing 3-5 steps with a railing? : Total 6 Click Score: 12    End of Session   Activity Tolerance: Patient tolerated treatment well;Patient limited by fatigue (pt requests to lie back down) Patient left: in bed;with bed  alarm set;with call bell/phone within reach Nurse Communication: Mobility status PT Visit Diagnosis:  Unsteadiness on feet (R26.81);Muscle weakness (generalized) (M62.81);Difficulty in walking, not elsewhere classified (R26.2);Other abnormalities of gait and mobility (R26.89)    Time: 7741-4239 PT Time Calculation (min) (ACUTE ONLY): 23 min   Charges:   PT Evaluation $PT Eval Moderate Complexity: 1 Mod          Aleda Grana, PT, DPT 06/22/22, 11:42 AM   Sandi Mariscal 06/22/2022, 11:40 AM

## 2022-06-22 NOTE — Progress Notes (Signed)
PHARMACY CONSULT NOTE  Pharmacy Consult for Electrolyte Monitoring and Replacement   Recent Labs: Potassium (mmol/L)  Date Value  06/22/2022 3.8  10/05/2013 3.9   Magnesium (mg/dL)  Date Value  03/50/0938 1.7   Calcium (mg/dL)  Date Value  18/29/9371 8.3 (L)   Calcium, Total (mg/dL)  Date Value  69/67/8938 9.5   Albumin (g/dL)  Date Value  10/09/5101 1.9 (L)  10/05/2013 4.4   Phosphorus (mg/dL)  Date Value  58/52/7782 2.1 (L)   Sodium (mmol/L)  Date Value  06/22/2022 133 (L)  10/05/2013 134 (L)   Assessment:  50 YOF w/ PMH of chronic EtOH abuse, chronic thrombocytopenia, hyponatremia, anxiety and depression, PTSD, and hypertension who presented with chief complaints of worsening periorbital swelling and pain status post fall on 6/23. Pharmacy is asked to follow and replace electrolytes while in CCU  Extubated 6/29  Goal of Therapy:  Electrolytes within normal limits  Plan:  --Phos 2.1, Phos-Nak 1 packet TIDACHS x 3 doses --Mg 1.7, magnesium sulfate 2 g x 1 --Patient care transferred from [facility] to [facility]. Will discontinue electrolyte consult at this time. Defer further ordering of labs and electrolyte replacement to primary team --Pharmacy will continue to monitor peripherally  Tressie Ellis 06/22/2022 7:47 AM

## 2022-06-22 NOTE — Assessment & Plan Note (Signed)
Appreciate infectious disease help.  Antitoxin effect given from Zyvox.

## 2022-06-22 NOTE — Assessment & Plan Note (Signed)
Meets criteria BMI greater than 30 

## 2022-06-22 NOTE — Assessment & Plan Note (Signed)
Multifactorial, initially from sepsis and then alcohol withdrawal.  Now still with some confusion although patient does answer some questions appropriately and this may be more delirium from sleep deprivation.  This looks to have fully resolved.  Ammonia level normal.

## 2022-06-22 NOTE — Assessment & Plan Note (Signed)
Status post alcohol withdrawals.  Was on Precedex drip.  As needed Ativan for now.  Based off of previous hospital records, had some hepatic steatosis and thrombocytopenia, but no evidence of cirrhosis.

## 2022-06-22 NOTE — Assessment & Plan Note (Signed)
With forehead laceration, periorbital bruising and cellulitis, scalp hematoma.  Last ophthalmology to evaluate eyes his left eyelid appears to have some sloughing..  Patient may be candidate for inpatient rehab.

## 2022-06-22 NOTE — Evaluation (Signed)
Occupational Therapy Evaluation Patient Details Name: Anna Lucas MRN: [id number] DOB: [birth year] Today's Date: 06/22/2022   History of Present Illness Pt is a 51 y/o F admitted on 06/16/22 after presenting to the ED with c/o worsening periorbital swelling & pain s/p fall on 06/15/22 (where pt found to have L occipital condyle fx). CT showed probable PNA & was started on antibiotics. On 06/17/22 pt was noted to be hypotensive & agitated with worsening mental status so tranferred to ICU. Pt was intubated 06/18/22 & extubated 06/21/22. PMH: EtOH abuse, chronic thrombocytopenia, hyponatremia, anxiety, depression, PTSD, HTN, hepatitis   Clinical Impression   Chart reviewed, pt seen as a co tx with PT on this date. Pt oriented to self only and is stating she is at her friends house multiple times. Pt is unaware she is in the hospital however does report falling down stairs prior to hospitalization. Pt does report she lives where she works. Fair-poor one step direction following. Supine<>sit with MIN A with step by step vcs for body mechanics, MIN-MOD A with HHA +2 for STS with B knees blocked. Pt with edema throughout B eyes and difficulty opening B eyes. MAX A required for grooming and LB dressing.  Chart review cervical collar was provided after discharge from [facility], not in room. Per report pt was refusing to wear collar prior to intubation. Secure chat sent to team for clarification in order to progress functional mobility and ADL completion. OT will continue to follow acutely.      Recommendations for follow up therapy are one component of a multi-disciplinary discharge planning process, led by the attending physician.  Recommendations may be updated based on patient status, additional functional criteria and insurance authorization.   Follow Up Recommendations  Acute inpatient rehab (3hours/day)    Assistance Recommended at Discharge Frequent or constant Supervision/Assistance  Patient can return  home with the following A lot of help with walking and/or transfers;A little help with bathing/dressing/bathroom    Functional Status Assessment  Patient has had a recent decline in their functional status and demonstrates the ability to make significant improvements in function in a reasonable and predictable amount of time.  Equipment Recommendations  Other (comment) (per next venue of care)    Recommendations for Other Services       Precautions / Restrictions Precautions Precautions: Fall;Cervical Required Braces or Orthoses: Cervical Brace Restrictions Weight Bearing Restrictions: No      Mobility Bed Mobility Overal bed mobility: Needs Assistance Bed Mobility: Supine to Sit, Sit to Supine     Supine to sit: Min assist, HOB elevated Sit to supine: Min assist, HOB elevated        Transfers Overall transfer level: Needs assistance Equipment used: 2 person hand held assist Transfers: Sit to/from Stand Sit to Stand: Mod assist, Min assist                  Balance Overall balance assessment: Needs assistance Sitting-balance support: Feet supported, Bilateral upper extremity supported Sitting balance-Leahy Scale: Fair     Standing balance support: Bilateral upper extremity supported, During functional activity Standing balance-Leahy Scale: Poor                             ADL either performed or assessed with clinical judgement   ADL Overall ADL's : Needs assistance/impaired     Grooming: Wash/dry face;Maximal assistance  Lower Body Dressing: Moderate assistance;Cueing for sequencing Lower Body Dressing Details (indicate cue type and reason): socks     Toileting- Clothing Manipulation and Hygiene: Maximal assistance               Vision Patient Visual Report: No change from baseline       Perception     Praxis      Pertinent Vitals/Pain Pain Assessment Pain Assessment: Faces Faces Pain Scale: Hurts a  little bit Pain Location: headache Pain Descriptors / Indicators: Discomfort, Grimacing Pain Intervention(s): Limited activity within patient's tolerance, Monitored during session, Repositioned     Hand Dominance     Extremity/Trunk Assessment Upper Extremity Assessment Upper Extremity Assessment: Difficult to assess due to impaired cognition;Generalized weakness   Lower Extremity Assessment Lower Extremity Assessment: Difficult to assess due to impaired cognition;Generalized weakness   Cervical / Trunk Assessment Cervical / Trunk Assessment:  (B eye swelling, barely able to open eyes)   Communication Communication Communication: No difficulties   Cognition Arousal/Alertness: Awake/alert Behavior During Therapy: Flat affect, Impulsive Overall Cognitive Status: Impaired/Different from baseline Area of Impairment: Orientation, Attention, Memory, Following commands, Safety/judgement, Awareness, Problem solving                 Orientation Level: Disoriented to, Place, Time, Situation Current Attention Level: Focused Memory: Decreased recall of precautions, Decreased short-term memory Following Commands: Follows one step commands inconsistently, Follows one step commands with increased time Safety/Judgement: Decreased awareness of deficits, Decreased awareness of safety Awareness: Intellectual Problem Solving: Slow processing, Decreased initiation, Requires tactile cues, Requires verbal cues, Difficulty sequencing       General Comments  BP EOB 152/133 mmHg MAP 141 in RUE but re-checked & 121/106 mmHg MAP 112    Exercises     Shoulder Instructions      Home Living Family/patient expects to be discharged to:: Unsure Living Arrangements: Alone                               Additional Comments: Pt reports she lives where she works, son requested to call pt mother, attempted phone call and no answer, VM full      Prior Functioning/Environment Prior Level  of Function : Independent/Modified Independent;Working/employed             Mobility Comments: Pt unable to provide information 2/2 impaired cognition, was working ADLs Comments: does not drive, indep in ADL/ADL per son        OT Problem List: Decreased strength;Impaired balance (sitting and/or standing);Decreased cognition;Decreased knowledge of precautions;Decreased safety awareness;Decreased activity tolerance;Decreased coordination;Decreased knowledge of use of DME or AE      OT Treatment/Interventions: Self-care/ADL training;Therapeutic exercise;Patient/family education;Neuromuscular education;Energy conservation;Therapeutic activities;DME and/or AE instruction    OT Goals(Current goals can be found in the care plan section) Acute Rehab OT Goals Patient Stated Goal: "eat something" Time For Goal Achievement: 07/06/22 Potential to Achieve Goals: Good ADL Goals Pt Will Perform Grooming: with modified independence;standing;sitting Pt Will Perform Upper Body Dressing: with modified independence;sitting Pt Will Perform Lower Body Dressing: with modified independence Pt Will Transfer to Toilet: with modified independence;ambulating Pt Will Perform Toileting - Clothing Manipulation and hygiene: with modified independence;sit to/from stand  OT Frequency: Min 4X/week    Co-evaluation PT/OT/SLP Co-Evaluation/Treatment: Yes Reason for Co-Treatment: Complexity of the patient's impairments (multi-system involvement);Necessary to address cognition/behavior during functional activity;For patient/therapist safety;To address functional/ADL transfers PT goals addressed during session: Mobility/safety with mobility;Balance OT goals  addressed during session: ADL's and self-care      AM-PAC OT "6 Clicks" Daily Activity     Outcome Measure Help from another person eating meals?: A Lot Help from another person taking care of personal grooming?: A Lot Help from another person toileting, which  includes using toliet, bedpan, or urinal?: Total Help from another person bathing (including washing, rinsing, drying)?: A Lot Help from another person to put on and taking off regular upper body clothing?: A Lot Help from another person to put on and taking off regular lower body clothing?: A Lot 6 Click Score: 11   End of Session Nurse Communication: Mobility status  Activity Tolerance: Patient tolerated treatment well Patient left: in bed;with call bell/phone within reach;with bed alarm set  OT Visit Diagnosis: Unsteadiness on feet (R26.81);Muscle weakness (generalized) (M62.81);Other symptoms and signs involving cognitive function                Time: 8850-2774 OT Time Calculation (min): 23 min Charges:  OT General Charges $OT Visit: 1 Visit OT Evaluation $OT Eval Moderate Complexity: 1 Mod  Oleta Mouse, OTD OTR/L  06/22/22, 1:41 PM

## 2022-06-22 NOTE — Progress Notes (Signed)
ID Pt is awake and alert Not able to open eyelids Parents at bed side  Eye lid swellign- less- minimally opening rt eye, left eye upperlid erythematous, scabs with discharge Facial swelling/ neck swelling much better The swelling under the rt mandible decreased a lot  Patient Vitals for the past 24 hrs:  BP Temp Temp src Pulse Resp SpO2  06/22/22 2100 131/75 99.1 F (37.3 C) Oral 63 16 99 %  06/22/22 2000 125/76 -- -- 69 17 99 %  06/22/22 1900 116/76 -- -- 72 -- 99 %  06/22/22 1800 129/79 -- -- 64 (!) 21 100 %  06/22/22 1700 139/82 -- -- 61 16 100 %  06/22/22 1600 (!) 143/77 99 F (37.2 C) Oral (!) 59 14 100 %  06/22/22 1500 (!) 159/88 -- -- (!) 58 13 99 %  06/22/22 1420 (!) 155/89 -- -- -- 19 100 %  06/22/22 1300 (!) 160/100 -- -- (!) 52 19 97 %  06/22/22 1200 (!) 154/97 98.3 F (36.8 C) Oral -- 18 100 %  06/22/22 1100 (!) 153/96 -- -- (!) 52 20 99 %  06/22/22 1000 (!) 164/96 -- -- (!) 54 (!) 21 97 %  06/22/22 0900 (!) 138/91 -- -- (!) 57 17 100 %  06/22/22 0800 (!) 154/88 -- -- (!) 56 18 98 %  06/22/22 0745 -- 98.7 F (37.1 C) Oral -- -- --  06/22/22 0700 (!) 145/99 -- -- (!) 59 18 99 %  06/22/22 0600 (!) 147/89 -- -- (!) 59 15 98 %  06/22/22 0500 (!) 160/95 -- -- 63 13 96 %  06/22/22 0400 (!) 152/89 -- -- 60 16 97 %  06/22/22 0300 (!) 141/92 98.4 F (36.9 C) Oral 63 15 97 %  06/22/22 0200 (!) 146/88 -- -- 81 20 94 %  06/22/22 0100 (!) 143/89 -- -- 69 20 95 %  06/22/22 0000 (!) 150/94 98.4 F (36.9 C) Oral 83 19 95 %  06/21/22 2300 (!) 156/102 -- -- 87 17 96 %  06/21/22 2200 -- -- -- 83 18 95 %   Chest b/l air entry Hs s1s2 Abd soft Cns alert, moves all limbs  Labs    Latest Ref Rng & Units 06/22/2022    5:53 AM 06/21/2022    5:03 AM 06/20/2022    4:27 AM  CBC  WBC 4.0 - 10.5 K/uL 7.7  12.8  16.9   Hemoglobin 12.0 - 15.0 g/dL 8.5  7.8  8.5   Hematocrit 36.0 - 46.0 % 26.4  24.8  26.3   Platelets 150 - 400 K/uL 111  101  103        Latest Ref Rng & Units  06/22/2022    5:53 AM 06/21/2022    5:03 AM 06/20/2022    4:27 AM  CMP  Glucose 70 - 99 mg/dL 160  109  323   BUN 6 - 20 mg/dL 13  17  18    Creatinine 0.44 - 1.00 mg/dL  5.57  3.22   Sodium 135 - 145 mmol/L 133  136  131   Potassium 3.5 - 5.1 mmol/L 3.8  3.4  4.1   Chloride 98 - 111 mmol/L 101  107  105   CO2 22 - 32 mmol/L 25  25  21    Calcium 8.9 - 10.3 mg/dL 8.3  7.7  8.0   Total Protein 6.5 - 8.1 g/dL  7.3    Total Bilirubin 0.3 - 1.2 mg/dL  0.2  Alkaline Phos 38 - 126 U/L  99    AST 15 - 41 U/L  15    ALT 0 - 44 U/L  19       Impression/recommendation  Group A streptococcus bacteremia with Toxic shock With facial cellulitis following an injury to forehead facial hematoma on the forehead- resolving Swelling over the neck much improved   Pt currently on PCN,  Linezolid discontinued 6/29 after 4 days Completed  IVIG X 3 doses     Acute hypoxic  resp failure due to early air way compromise from swollen neck- resolved   Leucocytosis - resolved ? DC steroids  Renal function normal  Anemia  Alcohol use disorder-   Discussed the management with patient  , her parents and her nurse and hospitlaist ID will follow her peripherally this weekend- call if needed

## 2022-06-22 NOTE — Assessment & Plan Note (Signed)
Continue Neurontin. 

## 2022-06-22 NOTE — Progress Notes (Signed)
? ?  Inpatient Rehab Admissions Coordinator : ? ?Per therapy recommendations, patient was screened for CIR candidacy by Alieah Brinton RN MSN.  At this time patient appears to be a potential candidate for CIR. I will place a rehab consult per protocol for full assessment. Please call me with any questions. ? ?Zavian Slowey RN MSN ?Admissions Coordinator ?[phone] ?  ?

## 2022-06-22 NOTE — Progress Notes (Signed)
[facility] Progress Note  Patient: Anna Lucas    WEX:[id number]  DOA: 06/16/2022    Date of Service: the patient was seen and examined on 06/22/2022  Brief hospital course: 51 year old female with past medical history of PTSD, hypertension and chronic alcohol abuse with secondary thrombocytopenia who presented to the emergency room at [facility] on 6/23 after a fall and at that time had sustained a left occipital condyle fracture as well as right frontal scalp hematoma and right forehead scalp laceration.  Patient was seen and repaired by plastic surgery in the emergency room and discharged on Augmentin.  She presented to [facility] emergency room on 6/24 with worsening periorbital swelling and pain.  Lab work was unremarkable however CT of the chest/abdomen/pelvis noted probable pneumonia patient was admitted to the hospitalist service for sepsis from pneumonia and started on broad-spectrum antibiotics.  Patient was seen by ophthalmology for her facial hematoma recommended ice packs.  Blood cultures at that time came back positive for strep pyogenes, felt to be secondary to facial cellulitis.  Infectious disease consulted.  On the night of 6/25 patient became hypotensive and then confused as well as having fevers and then transferred to the ICU for Precedex drip.  Due to obstructive neck edema patient was intubated for airway protection.  Decompensation for to be secondary to alcohol withdrawal.  Patient able to be successfully extubated on 6/29 and transferred to hospitalist service starting 6/30.  Assessment and Plan: Assessment and Plan: * Sepsis due to Streptococcus pyogenes [facility]) Patient met criteria for severe sepsis on admission given leukocytosis, tachycardia, tachypnea with source being facial cellulitis cellulitis, and lactic acidosis.  Infectious disease following.  Sepsis is now stabilized.  Continue penicillin and can DC Zyvox.  Will discuss with ophthalmology as per ID recommendations given  concerns for sloughing of the left eyelid.  Toxic shock ([place]) Appreciate infectious disease help.  Antitoxin effect given from Zyvox.  Can stop now.  Acute metabolic encephalopathy Multifactorial, initially from sepsis and then alcohol withdrawal.  Now still with some confusion although patient does answer some questions appropriately and this may be more delirium from sleep deprivation.  Checking ammonia level  Alcohol withdrawal delirium, persistent, hyperactive ([place]) Off of Precedex.  Extubated from ventilator, more for airway protection from edema from facial trauma  Alcohol abuse Status post alcohol withdrawals.  Was on Precedex drip.  As needed Ativan for now.  Based off of previous hospital records, had some hepatic steatosis and thrombocytopenia, but no evidence of cirrhosis.  Facial trauma, subsequent encounter With forehead laceration, periorbital bruising and cellulitis, scalp hematoma.  Last ophthalmology to evaluate eyes his left eyelid appears to have some sloughing.  Also look into cervical collar for activity.  Patient may be candidate for inpatient rehab.  Anxiety and depression Continue home medications  Peripheral neuropathy Continue Neurontin  Obesity (BMI 30-39.9) Meets criteria BMI greater than 30  Hypokalemia Replacing as needed  Hypomagnesemia Replacing as needed       Body mass index is 32.25 kg/m.  Nutrition Problem: Inadequate oral intake Etiology: inability to eat     Consultants: Critical Care Ophthalmology ID   Procedures: Ventilator support 6/26-6/29   Antimicrobials: Zyvox 6/27-6/30 IV PCN 6/27-6/30   Code Status: Full Code    Subjective: patient tired, slightly confused  Objective: Vital signs were reviewed and unremarkable. Vitals:   06/22/22 1600 06/22/22 1700  BP: (!) 143/77 139/82  Pulse: (!) 59 61  Resp: 14 16  Temp: 99 F (37.2 C)  SpO2: 100% 100%    Intake/Output Summary (Last 24 hours) at 06/22/2022  1823 Last data filed at 06/22/2022 1734 Gross per 24 hour  Intake 1515.16 ml  Output 1200 ml  Net 315.16 ml   Filed Weights   06/18/22 1320 06/20/22 0500 06/21/22 0500  Weight: 80.2 kg 84.1 kg 87.9 kg   Body mass index is 32.25 kg/m.  Exam:  General:  Drowsy, confused, no acute distress  HEENT: Periorbital swelling, scalp laceratioon Cardiovascular: regular rhythm, mild bradycardia   Respiratory: decreased to breath sounds bilaterally  Abdomen: soft, non-tender, nondistended, positive bowel sounds  Musculoskeletal: No clubbing or cyanosis, or edema  Skin: bruising as above Psychiatry: confused  Neurology: none  Data Reviewed: Hemoglobin improved  Disposition:  Status is: Inpatient Remains inpatient appropriate because: Improvement in mentation, determination of disposition    Anticipated discharge date: 7/5, possibly CIR    Family Communication: Multiple family members at the bedside DVT Prophylaxis: enoxaparin (LOVENOX) injection 40 mg Start: 06/21/22 2200 SCDs Start: 06/17/22 0455    Author: Annita Brod ,MD 06/22/2022 6:23 PM  To reach On-call, see care teams to locate the attending and reach out via [email or web address]. Between 7PM-7AM, please contact night-coverage If you still have difficulty reaching the attending provider, please page the [facility] (Director on Call) for [facility] on amion for assistance.

## 2022-06-22 NOTE — Progress Notes (Signed)
Manuela Schwartz, NP notified that patients CBG down to 75. Patient able to tolerate fluids by mouth. OJ given and patient did drink all of it. Acknowledged and new order to recheck CBG in one hour.

## 2022-06-22 NOTE — Assessment & Plan Note (Signed)
Continue home medications.  Steroids have been making the patient more anxious.  She still has 2 more days to go, so we will stop it a little early now.

## 2022-06-23 DIAGNOSIS — L03211 Cellulitis of face: Secondary | ICD-10-CM | POA: Diagnosis not present

## 2022-06-23 DIAGNOSIS — R7881 Bacteremia: Secondary | ICD-10-CM | POA: Diagnosis not present

## 2022-06-23 DIAGNOSIS — F1010 Alcohol abuse, uncomplicated: Secondary | ICD-10-CM | POA: Diagnosis not present

## 2022-06-23 DIAGNOSIS — A491 Streptococcal infection, unspecified site: Secondary | ICD-10-CM | POA: Diagnosis not present

## 2022-06-23 LAB — CBC
HCT: 29 % — ABNORMAL LOW (ref 36.0–46.0)
Hemoglobin: 9.7 g/dL — ABNORMAL LOW (ref 12.0–15.0)
MCH: 32.6 pg (ref 26.0–34.0)
MCHC: 33.4 g/dL (ref 30.0–36.0)
MCV: 97.3 fL (ref 80.0–100.0)
Platelets: 139 10*3/uL — ABNORMAL LOW (ref 150–400)
RBC: 2.98 MIL/uL — ABNORMAL LOW (ref 3.87–5.11)
RDW: 13.5 % (ref 11.5–15.5)
WBC: 9.5 10*3/uL (ref 4.0–10.5)
nRBC: 0.7 % — ABNORMAL HIGH (ref 0.0–0.2)

## 2022-06-23 LAB — BASIC METABOLIC PANEL
Anion gap: 4 — ABNORMAL LOW (ref 5–15)
BUN: 12 mg/dL (ref 6–20)
CO2: 28 mmol/L (ref 22–32)
Calcium: 7.9 mg/dL — ABNORMAL LOW (ref 8.9–10.3)
Chloride: 100 mmol/L (ref 98–111)
Creatinine, Ser: 0.57 mg/dL (ref 0.44–1.00)
GFR, Estimated: >60 mL/min (ref >60)
Glucose, Bld: 94 mg/dL (ref 70–99)
Potassium: 3.7 mmol/L (ref 3.5–5.1)
Sodium: 132 mmol/L — ABNORMAL LOW (ref 135–145)

## 2022-06-23 LAB — AEROBIC CULTURE W GRAM STAIN (SUPERFICIAL SPECIMEN)

## 2022-06-23 LAB — GLUCOSE, CAPILLARY
Glucose-Capillary: 126 mg/dL — ABNORMAL HIGH (ref 70–99)
Glucose-Capillary: 127 mg/dL — ABNORMAL HIGH (ref 70–99)
Glucose-Capillary: 83 mg/dL (ref 70–99)
Glucose-Capillary: 83 mg/dL (ref 70–99)
Glucose-Capillary: 84 mg/dL (ref 70–99)
Glucose-Capillary: 94 mg/dL (ref 70–99)
Glucose-Capillary: 95 mg/dL (ref 70–99)
Glucose-Capillary: 98 mg/dL (ref 70–99)

## 2022-06-23 LAB — MAGNESIUM: Magnesium: 1.6 mg/dL — ABNORMAL LOW (ref 1.7–2.4)

## 2022-06-23 LAB — PHOSPHORUS: Phosphorus: 3.7 mg/dL (ref 2.5–4.6)

## 2022-06-23 MED ORDER — SODIUM CHLORIDE 0.9 % IV SOLN: INTRAVENOUS | Status: DC | PRN

## 2022-06-23 MED ORDER — MAGNESIUM SULFATE 4 GM/100ML IV SOLN
4.0000 g | Freq: Once | INTRAVENOUS | Status: AC
  Administered 2022-06-23: 4 g via INTRAVENOUS
  Filled 2022-06-23: qty 100

## 2022-06-23 MED ORDER — ENSURE ENLIVE PO LIQD
237.0000 mL | Freq: Three times a day (TID) | ORAL | Status: DC
  Administered 2022-06-23 – 2022-06-27 (×8): 237 mL via ORAL

## 2022-06-23 MED ORDER — BLISTEX MEDICATED EX OINT
TOPICAL_OINTMENT | CUTANEOUS | Status: DC | PRN
  Administered 2022-06-24: 1 via TOPICAL
  Filled 2022-06-23: qty 6.3

## 2022-06-23 MED ORDER — TRAZODONE HCL 50 MG PO TABS
50.0000 mg | ORAL_TABLET | Freq: Every day | ORAL | Status: DC
  Administered 2022-06-23 – 2022-06-27 (×5): 50 mg via ORAL
  Filled 2022-06-23 (×5): qty 1

## 2022-06-23 NOTE — Progress Notes (Addendum)
PT Cancellation Note  Patient Details Name: Anna Lucas MRN: [id number] DOB: 07-15-1971   Cancelled Treatment:    F/u with MD (Dr. Rito Ehrlich) via secure chat with MD reporting cervical collar is necessary. Requested he place an order for a new collar as pt doesn't have one in her room (& therapy unsuccessful with reaching family member via telephone yesterday). Will hold PT tx until pt receives cervical collar as MD reports it's necessary for mobility. Will f/u as able.  F/u with situation at 11:40AM & pt still hasn't received cervical collar. Will hold PT tx until pt receives it. Will f/u as able.    Aleda Grana, PT, DPT 06/23/22, 11:45 AM   Sandi Mariscal 06/23/2022, 10:35 AM

## 2022-06-23 NOTE — Plan of Care (Signed)
Discussed with patient plan of care for the evening, pain management and medications with some teach back displayed ° °Problem: Education: °Goal: Knowledge of General Education information will improve °Description: Including pain rating scale, medication(s)/side effects and non-pharmacologic comfort measures °Outcome: Progressing °  °

## 2022-06-23 NOTE — Progress Notes (Signed)
Anna Lucas notified recheck was 73 and patient encouraged to eat peanut butter cracker in addition to OJ. Patient consumed 2 peanut butter crackers. Will continue to monitor.

## 2022-06-23 NOTE — Progress Notes (Signed)
Nutrition Follow-up  DOCUMENTATION CODES:   Not applicable  INTERVENTION:   -Liberalize diet to regular for wider variety of meal selections -MVI with minerals daily -Ensure Enlive po TID, each supplement provides 350 kcal and 20 grams of protein.   NUTRITION DIAGNOSIS:   Inadequate oral intake related to inability to eat as evidenced by NPO status.  Progressing; advanced to PO diet on 06/22/22  GOAL:   Patient will meet greater than or equal to 90% of their needs  Progressing   MONITOR:   PO intake, Supplement acceptance  REASON FOR ASSESSMENT:   Ventilator, Consult Enteral/tube feeding initiation and management  ASSESSMENT:   Pt with significant PMH of chronic EtOH abuse, chronic thrombocytopenia, hyponatremia, anxiety and depression, PTSD, and hypertension who presented with chief complaints of worsening periorbital swelling and pain status post fall on 6/23.  6/29- extubated 6/30- advanced to carb modified diet  Reviewed I/O's: +39 ml x 24 hours and +13.5 L since admission  UOP: 1 L x 24 hours   Pt advanced to a carb modified diet. Noted meal completions 5-20%. Pt able to tolerate solid foods (ate graham crackers last night) and drinking fluids well.   Per PT notes, awaiting cervical collar placement for mobility.   Medications reviewed and include colace, folic acid, solu-medrol, and thiamine.   Labs reviewed: Na: 132, CBGS: 98 (inpatient orders for glycemic control are 0-9 units insulin aspart TID with meals).    Diet Order:   Diet Order             Diet Carb Modified Fluid consistency: Thin; Room service appropriate? Yes  Diet effective now                   EDUCATION NEEDS:   Not appropriate for education at this time  Skin:  Skin Assessment: Skin Integrity Issues: Skin Integrity Issues:: Other (Comment) Other: non-pressure wound to rt eye, rt upper forehead laceration  Last BM:  06/23/22  Height:   Ht Readings from Last 1 Encounters:   06/16/22 5\' 5"  (1.651 m)    Weight:   Wt Readings from Last 1 Encounters:  06/23/22 85.1 kg    Ideal Body Weight:  56.8 kg  BMI:  Body mass index is 31.22 kg/m.  Estimated Nutritional Needs:   Kcal:  1700-1900  Protein:  85-100 grams  Fluid:  > 1.7 L    08/24/22, RD, LDN, CDCES Registered Dietitian II Certified Diabetes Care and Education Specialist Please refer to [facility] for RD and/or RD on-call/weekend/after hours pager

## 2022-06-23 NOTE — Progress Notes (Signed)
Inpatient Rehab Admissions Coordinator:  Consult received. Attempted to contact both pt and father. Did not receive an answer. Left message on father's phone. Awaiting return call.   Wolfgang Phoenix, MS, CCC-SLP Admissions Coordinator [phone]

## 2022-06-23 NOTE — Progress Notes (Signed)
Manuela Schwartz, NP notified of magnesium 1.6, acknowledged and new order entered for Magnesium 4 grams IV once now. Continue to monitor.

## 2022-06-23 NOTE — Progress Notes (Signed)
[facility] Progress Note  Patient: Anna Lucas    DJT:[id number]  DOA: 06/16/2022    Date of Service: the patient was seen and examined on 06/23/2022  Brief hospital course: 51 year old female with past medical history of PTSD, hypertension and chronic alcohol abuse with secondary thrombocytopenia who presented to the emergency room at [facility] on 6/23 after a fall and at that time had sustained a left occipital condyle fracture as well as right frontal scalp hematoma and right forehead scalp laceration.  Patient was seen and repaired by plastic surgery in the emergency room and discharged on Augmentin.  She presented to [facility] emergency room on 6/24 with worsening periorbital swelling and pain.  Lab work was unremarkable however CT of the chest/abdomen/pelvis noted probable pneumonia patient was admitted to the hospitalist service for sepsis from pneumonia and started on broad-spectrum antibiotics.  Patient was seen by ophthalmology for her facial hematoma recommended ice packs.  Blood cultures at that time came back positive for strep pyogenes, felt to be secondary to facial cellulitis.  Infectious disease consulted.  On the night of 6/25 patient became hypotensive and then confused as well as having fevers and then transferred to the ICU for Precedex drip.  Due to obstructive neck edema patient was intubated for airway protection.  Decompensation for to be secondary to alcohol withdrawal.  Patient able to be successfully extubated on 6/29 and transferred to hospitalist service starting 6/30.  Assessment and Plan: Assessment and Plan: * Sepsis due to Streptococcus pyogenes (HCC)-resolved as of 06/23/2022 Patient met criteria for severe sepsis on admission given leukocytosis, tachycardia, tachypnea with source being facial cellulitis cellulitis, and lactic acidosis.  Infectious disease following.  Sepsis is now stabilized.  Continue penicillin and can DC Zyvox.  Will discuss with ophthalmology as per  ID recommendations given concerns for sloughing of the left eyelid.  Toxic shock ([place]) Appreciate infectious disease help.  Antitoxin effect given from Zyvox.  Can stop now.  Acute metabolic encephalopathy-resolved as of 06/23/2022 Multifactorial, initially from sepsis and then alcohol withdrawal.  Now still with some confusion although patient does answer some questions appropriately and this may be more delirium from sleep deprivation.  This looks to have fully resolved.  Ammonia level normal.  Alcohol abuse Status post alcohol withdrawals.  Was on Precedex drip.  As needed Ativan for now.  Based off of previous hospital records, had some hepatic steatosis and thrombocytopenia, but no evidence of cirrhosis.  Alcohol withdrawal delirium, persistent, hyperactive (HCC)-resolved as of 06/23/2022 Off of Precedex.  Extubated from ventilator, more for airway protection from edema from facial trauma  Facial trauma, subsequent encounter With forehead laceration, periorbital bruising and cellulitis, scalp hematoma.  Last ophthalmology to evaluate eyes his left eyelid appears to have some sloughing..  Patient may be candidate for inpatient rehab.  Anxiety and depression Continue home medications  Peripheral neuropathy Continue Neurontin  Obesity (BMI 30-39.9) Meets criteria BMI greater than 30  Hypokalemia Replacing as needed  Hypomagnesemia Replacing as needed       Body mass index is 31.22 kg/m.  Nutrition Problem: Inadequate oral intake Etiology: inability to eat     Consultants: Critical Care Ophthalmology ID   Procedures: Ventilator support 6/26-6/29   Antimicrobials: Zyvox 6/27-6/30 IV PCN 6/27-6/30   Code Status: Full Code    Subjective: patient feels overwhelmed  Objective: Vital signs were reviewed and unremarkable. Vitals:   06/23/22 1200 06/23/22 1400  BP: (!) 154/89 (!) 153/96  Pulse: 79 89  Resp: 16  Temp:    SpO2: 99% 100%    Intake/Output  Summary (Last 24 hours) at 06/23/2022 1529 Last data filed at 06/23/2022 1400 Gross per 24 hour  Intake 1572.52 ml  Output 3550 ml  Net -1977.48 ml    Filed Weights   06/20/22 0500 06/21/22 0500 06/23/22 0218  Weight: 84.1 kg 87.9 kg 85.1 kg   Body mass index is 31.22 kg/m.  Exam:  General: Alert and oriented, tearful HEENT: Periorbital swelling improving, scalp laceratioon Cardiovascular: Regular rate and rhythm, S1-S2 Respiratory: Clear to auscultation bilaterally Abdomen: soft, non-tender, nondistended, positive bowel sounds  Musculoskeletal: No clubbing or cyanosis, or edema  Skin: bruising as above Psychiatry: Anxious and tearful, but appropriate, no evidence of psychoses Neurology: none  Data Reviewed: Hemoglobin improved  Disposition:  Status is: Inpatient Remains inpatient appropriate because: Finishing antibiotics.  Disposition determination    Anticipated discharge date: 7/3, possibly CIR    Family Communication: Father and daughter at the bedside DVT Prophylaxis: enoxaparin (LOVENOX) injection 40 mg Start: 06/21/22 2200 SCDs Start: 06/17/22 0455    Author: Annita Brod ,MD 06/23/2022 3:29 PM  To reach On-call, see care teams to locate the attending and reach out via [email or web address]. Between 7PM-7AM, please contact night-coverage If you still have difficulty reaching the attending provider, please page the [facility] (Director on Call) for [facility] on amion for assistance.

## 2022-06-24 DIAGNOSIS — F1010 Alcohol abuse, uncomplicated: Secondary | ICD-10-CM | POA: Diagnosis not present

## 2022-06-24 DIAGNOSIS — A491 Streptococcal infection, unspecified site: Secondary | ICD-10-CM | POA: Diagnosis not present

## 2022-06-24 DIAGNOSIS — B379 Candidiasis, unspecified: Secondary | ICD-10-CM | POA: Diagnosis not present

## 2022-06-24 DIAGNOSIS — L03211 Cellulitis of face: Secondary | ICD-10-CM | POA: Diagnosis not present

## 2022-06-24 DIAGNOSIS — R7881 Bacteremia: Secondary | ICD-10-CM | POA: Diagnosis not present

## 2022-06-24 LAB — CULTURE, BLOOD (ROUTINE X 2)
Culture: NO GROWTH
Culture: NO GROWTH
Special Requests: ADEQUATE

## 2022-06-24 LAB — BASIC METABOLIC PANEL
Anion gap: 6 (ref 5–15)
BUN: 10 mg/dL (ref 6–20)
CO2: 27 mmol/L (ref 22–32)
Calcium: 8.1 mg/dL — ABNORMAL LOW (ref 8.9–10.3)
Chloride: 97 mmol/L — ABNORMAL LOW (ref 98–111)
Creatinine, Ser: 0.61 mg/dL (ref 0.44–1.00)
GFR, Estimated: >60 mL/min (ref >60)
Glucose, Bld: 108 mg/dL — ABNORMAL HIGH (ref 70–99)
Potassium: 3.8 mmol/L (ref 3.5–5.1)
Sodium: 130 mmol/L — ABNORMAL LOW (ref 135–145)

## 2022-06-24 LAB — CBC
HCT: 27.9 % — ABNORMAL LOW (ref 36.0–46.0)
Hemoglobin: 9.3 g/dL — ABNORMAL LOW (ref 12.0–15.0)
MCH: 32.4 pg (ref 26.0–34.0)
MCHC: 33.3 g/dL (ref 30.0–36.0)
MCV: 97.2 fL (ref 80.0–100.0)
Platelets: 154 10*3/uL (ref 150–400)
RBC: 2.87 MIL/uL — ABNORMAL LOW (ref 3.87–5.11)
RDW: 13.3 % (ref 11.5–15.5)
WBC: 8.8 10*3/uL (ref 4.0–10.5)
nRBC: 0.3 % — ABNORMAL HIGH (ref 0.0–0.2)

## 2022-06-24 LAB — MAGNESIUM: Magnesium: 1.7 mg/dL (ref 1.7–2.4)

## 2022-06-24 LAB — GLUCOSE, CAPILLARY
Glucose-Capillary: 115 mg/dL — ABNORMAL HIGH (ref 70–99)
Glucose-Capillary: 98 mg/dL (ref 70–99)
Glucose-Capillary: 108 mg/dL — ABNORMAL HIGH (ref 70–99)
Glucose-Capillary: 123 mg/dL — ABNORMAL HIGH (ref 70–99)
Glucose-Capillary: 117 mg/dL — ABNORMAL HIGH (ref 70–99)

## 2022-06-24 LAB — PHOSPHORUS: Phosphorus: 5.1 mg/dL — ABNORMAL HIGH (ref 2.5–4.6)

## 2022-06-24 MED ORDER — LINEZOLID 600 MG PO TABS
600.0000 mg | ORAL_TABLET | Freq: Two times a day (BID) | ORAL | Status: DC
Start: 2022-06-24 — End: 2022-06-28
  Administered 2022-06-24 – 2022-06-28 (×9): 600 mg via ORAL
  Filled 2022-06-24 (×10): qty 1

## 2022-06-24 MED ORDER — FLUCONAZOLE 50 MG PO TABS
150.0000 mg | ORAL_TABLET | Freq: Once | ORAL | Status: AC
  Administered 2022-06-24: 150 mg via ORAL
  Filled 2022-06-24: qty 1

## 2022-06-24 NOTE — Progress Notes (Signed)
Pt transferred to rm 101 with pt belongings. Report given to [facility], Charity fundraiser.

## 2022-06-24 NOTE — Progress Notes (Addendum)
Inpatient Rehab Admissions:  Inpatient Rehab Consult received.  I spoke with patient on the telephone for rehabilitation assessment and to discuss goals and expectations of an inpatient rehab admission.  Pt acknowledged understanding of CIR goals and expectations. Pt interested in pursuing CIR. Pt gave permission to contact parents. Spoke with parents on the telephone. The pt's mother is disabled and her father works. They feel pt would better benefit from a long-term therapy setting instead of CIR. TOC made aware.   ADDENDUM 1305: TOC informed AC that pt now wants to go to son's house after d/c from hospital. Attempted to contact son. Received no answer.  ADDENDUM 1648: Attempted to contact pt's son again. No one answered and unable to leave a message d/t mailbox being full.  Signed: Wolfgang Phoenix, MS, CCC-SLP Admissions Coordinator [phone]

## 2022-06-24 NOTE — Assessment & Plan Note (Addendum)
Patient complained of some burning when urinating and itching in her groin that has started over the last 24 hours.  She states she gets like this whenever she gets yeast infection that occurs pretty much every time she is on antibiotics.  Diflucan x1 on 7/2.  Will give second dose once antibiotics are completed.

## 2022-06-24 NOTE — Evaluation (Signed)
Speech Language Pathology Evaluation Patient Details Name: Anna Lucas MRN: [id number] DOB: Feb 15, 1971 Today's Date: 06/24/2022 Time: 4742-5956 SLP Time Calculation (min) (ACUTE ONLY): 15 min  Problem List:  Patient Active Problem List   Diagnosis Date Noted   Obesity (BMI 30-39.9) 06/22/2022   Facial trauma, subsequent encounter    CAP (community acquired pneumonia) 06/17/2022   Toxic shock (HCC) 06/17/2022   Hypokalemia 06/17/2022   Facial swelling    Elevated ferritin 05/13/2022   Hypomagnesemia 05/12/2022   Hypoalbuminemia 05/12/2022   Leukopenia 05/12/2022   Myelosuppression 05/12/2022   Acute hepatitis 05/11/2022   Anxiety and depression 05/11/2022   GERD (gastroesophageal reflux disease) 05/11/2022   Peripheral neuropathy 05/11/2022   Alcohol abuse 05/11/2022   Thrombocytopenia (HCC) 05/11/2022   Colon cancer screening 02/07/2021   Intractable vomiting 08/17/2016   Pyelonephritis 12/19/2015   Past Medical History:  Past Medical History:  Diagnosis Date   Acid reflux    Alcohol abuse    Anxiety    Depression    Hypertension    Hypomagnesemia 05/12/2022   Hyponatremia 05/12/2022   Hypophosphatemia 05/12/2022   PTSD (post-traumatic stress disorder)    Past Surgical History:  Past Surgical History:  Procedure Laterality Date   CESAREAN SECTION     HPI:  Per H&P "Anna Lucas is a 51 y.o. female with history of hypertension, neuropathy WAS admitted last month for acute hepatitis because was not clear at the time had a fall yesterday at the workplace as per the patient was taken to [facility] over the work-up showed left occipital condylar fracture was requested to wear C collar and also had sutures done on the face for laceration was discharged home but since patient's pain got worse patient comes to the ER.  Patient also states since the fall she has been in some cough."   Assessment / Plan / Recommendation Clinical Impression  Pt seen for  cognitive-linguistic evaluation. Pt alert, pleasant, and cooperative. C-collar in place. With NT upon SLP entrance to room. Pt endorsed feeling "fuzzy" cognitively and "seeing things that weren't there" with eyes closed.   Pt assessed via informal means and portions of Cognistat. Pt presents with cognitive-linguistic deficits affecting attention (sustained), orientation (temporal), memory (immediate, short term, working), verbal problem solving, abstract reasoning, and executive functioning. Pt oriented to self, place, and situation. Not to date or DOW. Pt with reduced sustained attention and was easily distracted by environmental stimuli. Pt able to be redirected with verbal cues. Pt with difficulty coding x4 unrelated words (x4 trials) and pt scored 8/12 on Memory subtest of Cognistat (recalling 2/2 words after 5 minute delay independently; 1/4 with category cue). Pt scored 6/12 on Similarities subtest of Cognistat, 2/3 on Judgment subtest, 2/4 on Calculation subtest of Cognistat. Pt with reduced error awareness and ability to amend errors if identified by SLP. Pt speech is fluent and appropriate. No s/sx anomia or dysarthria appreciated. Pt able to follow complex commands and answer complex yes/no questions without difficulty.   SLP to f/u for further diagnostic tx. Recommend post-acute SLP services for cognitive-linguistic deficits and initial frequent/constat supervision/assistance at d/c.   Pt and RN made aware of results, recommendations, and SLP POC. Pt may benefit from reinforcement of content given cognitive-linguistic deficits.     SLP Assessment  SLP Recommendation/Assessment: Patient needs continued Speech Lanaguage Pathology Services SLP Visit Diagnosis: Cognitive communication deficit (R41.841)    Recommendations for follow up therapy are one component of a multi-disciplinary discharge planning process,  led by the attending physician.  Recommendations may be updated based on patient  status, additional functional criteria and insurance authorization.    Follow Up Recommendations  Acute inpatient rehab (3hours/day)    Assistance Recommended at Discharge  Frequent or constant Supervision/Assistance  Functional Status Assessment Patient has had a recent decline in their functional status and demonstrates the ability to make significant improvements in function in a reasonable and predictable amount of time.  Frequency and Duration min 2x/week  2 weeks      SLP Evaluation Cognition  Overall Cognitive Status: Impaired/Different from baseline Arousal/Alertness: Awake/alert Orientation Level: Disoriented to time Year: 2023 Month: July Day of Week: Incorrect Attention: Sustained Sustained Attention: Impaired Memory: Impaired Memory Impairment: Storage deficit;Retrieval deficit Problem Solving: Impaired Problem Solving Impairment: Verbal basic Executive Function: Reasoning;Sequencing;Self Monitoring;Self Correcting Reasoning: Impaired Reasoning Impairment: Verbal basic Sequencing: Impaired Sequencing Impairment: Verbal complex Self Monitoring: Impaired Self Monitoring Impairment: Verbal basic Self Correcting: Impaired Self Correcting Impairment: Verbal basic Safety/Judgment: Impaired       Comprehension  Auditory Comprehension Overall Auditory Comprehension: Appears within functional limits for tasks assessed    Expression Expression Primary Mode of Expression: Verbal Verbal Expression Overall Verbal Expression: Appears within functional limits for tasks assessed Initiation: No impairment Automatic Speech:  (social automatic speech) Repetition: No impairment Naming: No impairment   Oral / Motor  Oral Motor/Sensory Function Overall Oral Motor/Sensory Function: Within functional limits Motor Speech Overall Motor Speech: Appears within functional limits for tasks assessed Respiration: Within functional limits Phonation: Normal Resonance: Within  functional limits Articulation: Within functional limitis Intelligibility: Intelligible Motor Planning: Witnin functional limits Motor Speech Errors: Not applicable           Clyde Canterbury, M.S., CCC-SLP Speech-Language Pathologist [facility] [phone] (ASCOM)   Woodroe Chen 06/24/2022, 2:15 PM
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
## 2022-06-24 NOTE — Progress Notes (Signed)
Patient is able to move head up and down and from side to side.  Patient wears neck collar off and on throughout the night.  Patient probably needs to get up to the bedside commode during the day and use the pure wick at night if necessary while sleeping.
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
## 2022-06-24 NOTE — Assessment & Plan Note (Deleted)
Patient complains of some burning when urinating and itching in her groin that has started over the last 24 hours.  She states she gets like this whenever she gets yeast infection that occurs pretty much every time she is on antibiotics.  Have ordered 1 dose of Diflucan.  Will give second dose once antibiotics are completed.

## 2022-06-24 NOTE — Progress Notes (Signed)
Physical Therapy Treatment Patient Details Name: Anna Lucas MRN: [id number] DOB: [birth year] Today's Date: 06/24/2022   History of Present Illness Pt is a 51 y/o F admitted on 06/16/22 after presenting to the ED with c/o worsening periorbital swelling & pain s/p fall on 06/15/22 (where pt found to have L occipital condyle fx). CT showed probable PNA & was started on antibiotics. On 06/17/22 pt was noted to be hypotensive & agitated with worsening mental status so tranferred to ICU. Pt was intubated 06/18/22 & extubated 06/21/22. PMH: EtOH abuse, chronic thrombocytopenia, hyponatremia, anxiety, depression, PTSD, HTN, hepatitis    PT Comments    Pt seen for PT tx with pt motivated to participate. Pt with soft collar in room & PT dons it, educating pt on need to wear collar at all times. Pt is able to complete bed mobility without physical assistance but completes STS & stand pivot bed>recliner without AD with max assist. Pt demonstrates significantly impaired BLE strength & balance but engages in activities listed below focusing on deficits. Pt continues to be a great CIR candidate & would benefit from intensive therapies to help facilitate return to PLOF.    Recommendations for follow up therapy are one component of a multi-disciplinary discharge planning process, led by the attending physician.  Recommendations may be updated based on patient status, additional functional criteria and insurance authorization.  Follow Up Recommendations  Acute inpatient rehab (3hours/day)     Assistance Recommended at Discharge Frequent or constant Supervision/Assistance  Patient can return home with the following Two people to help with walking and/or transfers;Two people to help with bathing/dressing/bathroom;Help with stairs or ramp for entrance;Assist for transportation;Assistance with cooking/housework   Equipment Recommendations  None recommended by PT    Recommendations for Other Services Rehab  consult     Precautions / Restrictions Precautions Precautions: Fall;Cervical Required Braces or Orthoses: Cervical Brace Cervical Brace: Soft collar;At all times Restrictions Weight Bearing Restrictions: No     Mobility  Bed Mobility Overal bed mobility: Needs Assistance Bed Mobility: Supine to Sit     Supine to sit: Supervision (HOB slightly elevated, bed rails PRN)          Transfers Overall transfer level: Needs assistance Equipment used: None Transfers: Sit to/from Stand, Bed to chair/wheelchair/BSC Sit to Stand: Max assist   Step pivot transfers: Max assist (step pivot bed>recliner without AD & max assist)       General transfer comment: STS multiple times from EOB & recliner with max assist    Ambulation/Gait                   Stairs             Wheelchair Mobility    Modified Rankin (Stroke Patients Only)       Balance Overall balance assessment: Needs assistance Sitting-balance support: Feet supported, Bilateral upper extremity supported Sitting balance-Leahy Scale: Fair Sitting balance - Comments: supervision static sitting   Standing balance support: During functional activity, No upper extremity supported Standing balance-Leahy Scale: Zero                              Cognition Arousal/Alertness: Awake/alert Behavior During Therapy:  (tearful at times) Overall Cognitive Status: Impaired/Different from baseline Area of Impairment: Safety/judgement                       Following Commands: Follows one step commands consistently  General Comments: Pt oriented to self, month/year, location & situation on this date.        Exercises Other Exercises Other Exercises: Pt performed 5x STS with max assist from recliner without BUE support with pt using momentum & overall focus on BLE strengthening. Pt rotates to the right in standing but with improving ability to come to full upright standing. Other  Exercises: Pt performs 10 marching in place with BUE support on RW. Other Exercises: Pt performs stepping forwards & backwards x 2 steps x 4 times with RW & max assist with pt initially scooting BLE forward but eventually progressing to stepping forwards with improving foot clearance.    General Comments        Pertinent Vitals/Pain Pain Assessment Pain Assessment: No/denies pain    Home Living   Living Arrangements: Alone   Type of Home: House Home Access: Ramped entrance       Home Layout: One level        Prior Function            PT Goals (current goals can now be found in the care plan section) Acute Rehab PT Goals Patient Stated Goal: get better PT Goal Formulation: With patient Time For Goal Achievement: 07/06/22 Potential to Achieve Goals: Fair Progress towards PT goals: Progressing toward goals    Frequency    7X/week      PT Plan Current plan remains appropriate    Co-evaluation              AM-PAC PT "6 Clicks" Mobility   Outcome Measure  Help needed turning from your back to your side while in a flat bed without using bedrails?: A Little Help needed moving from lying on your back to sitting on the side of a flat bed without using bedrails?: A Little Help needed moving to and from a bed to a chair (including a wheelchair)?: Total Help needed standing up from a chair using your arms (e.g., wheelchair or bedside chair)?: Total Help needed to walk in hospital room?: Total Help needed climbing 3-5 steps with a railing? : Total 6 Click Score: 10    End of Session Equipment Utilized During Treatment: Gait belt Activity Tolerance: Patient tolerated treatment well Patient left: in chair;with call bell/phone within reach Nurse Communication: Mobility status PT Visit Diagnosis: Unsteadiness on feet (R26.81);Muscle weakness (generalized) (M62.81);Difficulty in walking, not elsewhere classified (R26.2);Other abnormalities of gait and mobility  (R26.89)     Time: 6195-0932 PT Time Calculation (min) (ACUTE ONLY): 20 min  Charges:  $Therapeutic Activity: 8-22 mins                     Aleda Grana, PT, DPT 06/24/22, 12:26 PM   Sandi Mariscal 06/24/2022, 12:24 PM

## 2022-06-25 DIAGNOSIS — F1010 Alcohol abuse, uncomplicated: Secondary | ICD-10-CM | POA: Diagnosis not present

## 2022-06-25 DIAGNOSIS — E669 Obesity, unspecified: Secondary | ICD-10-CM | POA: Diagnosis not present

## 2022-06-25 DIAGNOSIS — A400 Sepsis due to streptococcus, group A: Secondary | ICD-10-CM | POA: Diagnosis not present

## 2022-06-25 DIAGNOSIS — R220 Localized swelling, mass and lump, head: Secondary | ICD-10-CM | POA: Diagnosis not present

## 2022-06-25 DIAGNOSIS — A483 Toxic shock syndrome: Secondary | ICD-10-CM | POA: Diagnosis not present

## 2022-06-25 DIAGNOSIS — F10931 Alcohol use, unspecified with withdrawal delirium: Secondary | ICD-10-CM | POA: Diagnosis not present

## 2022-06-25 LAB — GLUCOSE, CAPILLARY
Glucose-Capillary: 101 mg/dL — ABNORMAL HIGH (ref 70–99)
Glucose-Capillary: 106 mg/dL — ABNORMAL HIGH (ref 70–99)
Glucose-Capillary: 129 mg/dL — ABNORMAL HIGH (ref 70–99)
Glucose-Capillary: 142 mg/dL — ABNORMAL HIGH (ref 70–99)
Glucose-Capillary: 169 mg/dL — ABNORMAL HIGH (ref 70–99)
Glucose-Capillary: 98 mg/dL (ref 70–99)

## 2022-06-25 LAB — BASIC METABOLIC PANEL
Anion gap: 9 (ref 5–15)
BUN: 12 mg/dL (ref 6–20)
CO2: 27 mmol/L (ref 22–32)
Calcium: 8.8 mg/dL — ABNORMAL LOW (ref 8.9–10.3)
Chloride: 97 mmol/L — ABNORMAL LOW (ref 98–111)
Creatinine, Ser: 0.57 mg/dL (ref 0.44–1.00)
GFR, Estimated: >60 mL/min (ref >60)
Glucose, Bld: 97 mg/dL (ref 70–99)
Potassium: 3.9 mmol/L (ref 3.5–5.1)
Sodium: 133 mmol/L — ABNORMAL LOW (ref 135–145)

## 2022-06-25 LAB — CBC
HCT: 28.7 % — ABNORMAL LOW (ref 36.0–46.0)
Hemoglobin: 9.3 g/dL — ABNORMAL LOW (ref 12.0–15.0)
MCH: 31.6 pg (ref 26.0–34.0)
MCHC: 32.4 g/dL (ref 30.0–36.0)
MCV: 97.6 fL (ref 80.0–100.0)
Platelets: 175 10*3/uL (ref 150–400)
RBC: 2.94 MIL/uL — ABNORMAL LOW (ref 3.87–5.11)
RDW: 14 % (ref 11.5–15.5)
WBC: 7.5 10*3/uL (ref 4.0–10.5)
nRBC: 0 % (ref 0.0–0.2)

## 2022-06-25 LAB — PHOSPHORUS: Phosphorus: 4.8 mg/dL — ABNORMAL HIGH (ref 2.5–4.6)

## 2022-06-25 LAB — MAGNESIUM: Magnesium: 1.6 mg/dL — ABNORMAL LOW (ref 1.7–2.4)

## 2022-06-25 MED ORDER — PREDNISONE 20 MG PO TABS
40.0000 mg | ORAL_TABLET | Freq: Every day | ORAL | Status: AC
  Administered 2022-06-26: 40 mg via ORAL
  Filled 2022-06-25: qty 2

## 2022-06-25 MED ORDER — MAGNESIUM SULFATE 2 GM/50ML IV SOLN
2.0000 g | Freq: Once | INTRAVENOUS | Status: AC
  Administered 2022-06-25: 2 g via INTRAVENOUS
  Filled 2022-06-25: qty 50

## 2022-06-25 MED ORDER — ACETAMINOPHEN 325 MG PO TABS
650.0000 mg | ORAL_TABLET | Freq: Four times a day (QID) | ORAL | Status: DC | PRN
  Filled 2022-06-25 (×2): qty 2

## 2022-06-25 MED ORDER — ACETAMINOPHEN 650 MG RE SUPP
650.0000 mg | Freq: Four times a day (QID) | RECTAL | Status: DC | PRN
Start: 2022-06-25 — End: 2022-06-28

## 2022-06-25 MED ORDER — PREDNISONE 10 MG PO TABS
10.0000 mg | ORAL_TABLET | Freq: Every day | ORAL | Status: DC
Start: 2022-06-29 — End: 2022-06-27

## 2022-06-25 MED ORDER — PREDNISONE 20 MG PO TABS
20.0000 mg | ORAL_TABLET | Freq: Every day | ORAL | Status: DC
Start: 2022-06-27 — End: 2022-06-27
  Administered 2022-06-27: 20 mg via ORAL
  Filled 2022-06-25: qty 1

## 2022-06-25 MED ORDER — STERILE WATER FOR INJECTION IJ SOLN
INTRAMUSCULAR | Status: AC
  Administered 2022-06-25: 10 mL
  Filled 2022-06-25: qty 10

## 2022-06-25 MED ORDER — PHENOBARBITAL 32.4 MG PO TABS
32.4000 mg | ORAL_TABLET | Freq: Three times a day (TID) | ORAL | Status: AC
  Administered 2022-06-25 – 2022-06-26 (×3): 32.4 mg via ORAL
  Filled 2022-06-25 (×3): qty 1

## 2022-06-25 NOTE — Progress Notes (Signed)
Physical Therapy Treatment Patient Details Name: Anna Lucas MRN: [id number] DOB: 12-18-1971 Today's Date: 06/25/2022   History of Present Illness Pt is a 51 y/o F admitted on 06/16/22 after presenting to the ED with c/o worsening periorbital swelling & pain s/p fall on 06/15/22 (where pt found to have L occipital condyle fx). CT showed probable PNA & was started on antibiotics. On 06/17/22 pt was noted to be hypotensive & agitated with worsening mental status so tranferred to ICU. Pt was intubated 06/18/22 & extubated 06/21/22. PMH: EtOH abuse, chronic thrombocytopenia, hyponatremia, anxiety, depression, PTSD, HTN, hepatitis    PT Comments    Pt seen for PT tx with pt agreeable & already with soft collar donned. Assisted pt with changing into clean gown 2/2 current one being soiled. Pt is able to complete STS with supervision on this date & ambulate 2 laps around nurses station without AD & min assist with impaired gait pattern as noted below. Pt reports she plans to go stay at her parents house & it does not have any steps to get inside. Due to great improvement in functional mobility have upgraded d/c recommendations to HHPT.    Recommendations for follow up therapy are one component of a multi-disciplinary discharge planning process, led by the attending physician.  Recommendations may be updated based on patient status, additional functional criteria and insurance authorization.  Follow Up Recommendations  Home health PT     Assistance Recommended at Discharge Frequent or constant Supervision/Assistance  Patient can return home with the following Help with stairs or ramp for entrance;Assist for transportation;Assistance with cooking/housework;A little help with walking and/or transfers;A little help with bathing/dressing/bathroom   Equipment Recommendations  Rolling walker (2 wheels)    Recommendations for Other Services       Precautions / Restrictions Precautions Precautions:  Fall;Cervical Required Braces or Orthoses: Cervical Brace Cervical Brace: Soft collar;At all times Restrictions Weight Bearing Restrictions: No     Mobility  Bed Mobility Overal bed mobility: Needs Assistance Bed Mobility: Supine to Sit     Supine to sit: Modified independent (Device/Increase time), HOB elevated          Transfers Overall transfer level: Needs assistance Equipment used: None Transfers: Sit to/from Stand Sit to Stand: Supervision                Ambulation/Gait Ambulation/Gait assistance: Min assist Gait Distance (Feet): 400 Feet Assistive device: None Gait Pattern/deviations: Decreased step length - right, Decreased step length - left, Decreased dorsiflexion - right, Decreased dorsiflexion - left, Decreased stride length Gait velocity: decreased     General Gait Details: Pt requires min cuing for obstacle avoidance but unsure if this is 2/2 impaired vision 2/2 eye edema or due to decreased cognition   Stairs             Wheelchair Mobility    Modified Rankin (Stroke Patients Only)       Balance Overall balance assessment: Needs assistance Sitting-balance support: Feet supported, Bilateral upper extremity supported Sitting balance-Leahy Scale: Good     Standing balance support: During functional activity, No upper extremity supported Standing balance-Leahy Scale: Fair                              Cognition Arousal/Alertness: Awake/alert   Overall Cognitive Status: Impaired/Different from baseline Area of Impairment: Safety/judgement  Following Commands: Follows one step commands consistently                Exercises      General Comments        Pertinent Vitals/Pain Pain Assessment Pain Assessment: 0-10 Pain Score: 8  Pain Location: headache Pain Descriptors / Indicators: Discomfort, Grimacing Pain Intervention(s): Monitored during session, Premedicated before  session    Home Living                          Prior Function            PT Goals (current goals can now be found in the care plan section) Acute Rehab PT Goals Patient Stated Goal: get better PT Goal Formulation: With patient Time For Goal Achievement: 07/06/22 Potential to Achieve Goals: Fair Progress towards PT goals: Progressing toward goals    Frequency    Min 2X/week      PT Plan Frequency needs to be updated;Discharge plan needs to be updated    Co-evaluation              AM-PAC PT "6 Clicks" Mobility   Outcome Measure  Help needed turning from your back to your side while in a flat bed without using bedrails?: None Help needed moving from lying on your back to sitting on the side of a flat bed without using bedrails?: None Help needed moving to and from a bed to a chair (including a wheelchair)?: A Little Help needed standing up from a chair using your arms (e.g., wheelchair or bedside chair)?: A Little Help needed to walk in hospital room?: A Little Help needed climbing 3-5 steps with a railing? : A Little 6 Click Score: 20    End of Session Equipment Utilized During Treatment: Gait belt Activity Tolerance: Patient tolerated treatment well Patient left: in chair;with chair alarm set;with call bell/phone within reach Nurse Communication: Mobility status PT Visit Diagnosis: Unsteadiness on feet (R26.81);Muscle weakness (generalized) (M62.81);Difficulty in walking, not elsewhere classified (R26.2);Other abnormalities of gait and mobility (R26.89)     Time: 3383-2919 PT Time Calculation (min) (ACUTE ONLY): 12 min  Charges:  $Therapeutic Activity: 8-22 mins                     Aleda Grana, PT, DPT 06/25/22, 12:09 PM  Sandi Mariscal 06/25/2022, 12:06 PM

## 2022-06-25 NOTE — Progress Notes (Signed)
Speech Language Pathology Treatment:    Patient Details Name: Anna Lucas MRN: [id number] DOB: May 28, 1971 Today's Date: 06/25/2022 Time: 8882-8003 SLP Time Calculation (min) (ACUTE ONLY): 15 min  Assessment / Plan / Recommendation Clinical Impression  Pt seen for cognitive-linguistic tx. Pt alert, pleasant, and cooperative. Endorsed 8/10 headache. Pt medicated by RN prior to start of session.   Pt participated in skilled SLP services targeting attention, orientation, memory, and problem solving. Pt able to sustain attention during duration of tx without redirection. Pt even offered to mute TV for she could "focus better." Pt answered temporal orientation questions without difficulty this date. Pt completed clockdrawing task with self-correction.   Given improvement in pt's cognitive-linguistic functioning, SLP to sign off at this level of care. Recommend [place] SLP services for cognitive-linguistic evaluation and tx in home/functional setting.   Pt and RN made aware of results, recommendations, and SLP POC. Pt verbalized understanding/agreement.    HPI HPI: Per H&P "Anna Lucas is a 51 y.o. female with history of hypertension, neuropathy WAS admitted last month for acute hepatitis because was not clear at the time had a fall yesterday at the workplace as per the patient was taken to [facility] over the work-up showed left occipital condylar fracture was requested to wear C collar and also had sutures done on the face for laceration was discharged home but since patient's pain got worse patient comes to the ER.  Patient also states since the fall she has been in some cough."      SLP Plan  All goals met      Recommendations for follow up therapy are one component of a multi-disciplinary discharge planning process, led by the attending physician.  Recommendations may be updated based on patient status, additional functional criteria and insurance authorization.    Recommendations          Follow Up Recommendations: Home health SLP Assistance recommended at discharge: Frequent or constant Supervision/Assistance SLP Visit Diagnosis: Cognitive communication deficit (K91.791) Plan: All goals met          Cherrie Gauze, M.S., [facility] [phone] [place])   Quintella Baton  06/25/2022, 2:55 PM

## 2022-06-25 NOTE — Progress Notes (Addendum)
Inpatient Rehab Admissions Coordinator:  Attempted to call pt's son Genelle Bal x2 (10:15am, 2:12pm). Left messages. Awaiting return call.  ADDENDUM: Note therapy now recommending HH therapy. Pt is now Min G with ADLs and Mod I with Bed mobility, Supervision with transfers and Min A 400' with gait. Pt appears to no longer require the intensity of CIR. Will sign off.   Wolfgang Phoenix, MS, CCC-SLP Admissions Coordinator [phone]

## 2022-06-25 NOTE — Plan of Care (Signed)
  Problem: SLP Cognition Goals Goal: Patient will demonstrate attention to functional Description: Patient will demonstrate attention to functional task with Outcome: Completed/Met Goal: Patient will utilize external memory aids Description: Patient will utilize external memory aids to facilitate recall of information for improved safety with Outcome: Completed/Met Goal: Patient will demonstrate problem solving skills Description: Patient will demonstrate problem solving skills during functional ADL's with Outcome: Completed/Met

## 2022-06-25 NOTE — Progress Notes (Signed)
[facility] Progress Note  Patient: Anna Lucas    GQQ:[id number]  DOA: 06/16/2022    Date of Service: the patient was seen and examined on 06/25/2022  Brief hospital course: 51 year old female with past medical history of PTSD, hypertension and chronic alcohol abuse with secondary thrombocytopenia who presented to the emergency room at [facility] on 6/23 after a fall and at that time had sustained a left occipital condyle fracture as well as right frontal scalp hematoma and right forehead scalp laceration.  Patient was seen and repaired by plastic surgery in the emergency room and discharged on Augmentin.  She presented to [facility] emergency room on 6/24 with worsening periorbital swelling and pain.  Lab work was unremarkable however CT of the chest/abdomen/pelvis noted probable pneumonia patient was admitted to the hospitalist service for sepsis from pneumonia and started on broad-spectrum antibiotics.  Patient was seen by ophthalmology for her facial hematoma recommended ice packs.  Blood cultures at that time came back positive for strep pyogenes, felt to be secondary to facial cellulitis.  Infectious disease consulted.  On the night of 6/25 patient became hypotensive and then confused as well as having fevers and then transferred to the ICU for Precedex drip.  Due to obstructive neck edema patient was intubated for airway protection.  Decompensation for to be secondary to alcohol withdrawal.  Patient able to be successfully extubated on 6/29 and transferred to hospitalist service starting 6/30.  Assessment and Plan: Assessment and Plan: * Sepsis due to Streptococcus pyogenes (HCC)-resolved as of 06/23/2022 Patient met criteria for severe sepsis on admission given leukocytosis, tachycardia, tachypnea with source being facial cellulitis cellulitis, and lactic acidosis.  Infectious disease following.  Sepsis is now stabilized.  Initially continued on penicillin and Zyvox stopped.  Tissue cultures grew out  penicillin resistant strep hominis and epidermis.  Zyvox restarted and penicillin stopped.  Complete 7 days of Zyvox on 7/4.  Toxic shock ([place]) Appreciate infectious disease help.  Antitoxin effect given from Zyvox.  Acute metabolic encephalopathy-resolved as of 06/23/2022 Multifactorial, initially from sepsis and then alcohol withdrawal.  Now still with some confusion although patient does answer some questions appropriately and this may be more delirium from sleep deprivation.  This looks to have fully resolved.  Ammonia level normal.  Alcohol abuse Status post alcohol withdrawals.  Was on Precedex drip.  As needed Ativan for now.  Based off of previous hospital records, had some hepatic steatosis and thrombocytopenia, but no evidence of cirrhosis.  On scheduled phenobarbital  Alcohol withdrawal delirium, persistent, hyperactive (HCC)-resolved as of 06/23/2022 Off of Precedex.  Extubated from ventilator, more for airway protection from edema from facial trauma  Facial trauma, subsequent encounter With forehead laceration, periorbital bruising and cellulitis, scalp hematoma.  Last ophthalmology to evaluate eyes his left eyelid appears to have some sloughing..  Improving, now no longer needs inpatient rehab.  For home health.  Anxiety and depression Continue home medications  Peripheral neuropathy Continue Neurontin  Obesity (BMI 30-39.9) Meets criteria BMI greater than 30  Yeast infection Patient complained of some burning when urinating and itching in her groin that has started over the last 24 hours.  She states she gets like this whenever she gets yeast infection that occurs pretty much every time she is on antibiotics.  Diflucan x1 on 7/2.  Will give second dose once antibiotics are completed.  Hypokalemia Replacing as needed  Hypomagnesemia Replacing as needed       Body mass index is 31.11 kg/m.  Nutrition  Problem: Inadequate oral intake Etiology: inability to eat      Consultants: Critical Care Ophthalmology ID   Procedures: Ventilator support 6/26-6/29   Antimicrobials: Zyvox 6/27-6/30, 7/2-present IV PCN 6/27-7/2 Diflucan x1 on 7/2   Code Status: Full Code    Subjective: Patient complains of some generalized pain  Objective: Vital signs were reviewed and unremarkable. Vitals:   06/25/22 1109 06/25/22 1616  BP: (!) 151/91 130/78  Pulse: 75 75  Resp: 15 18  Temp: 99.1 F (37.3 C) 99 F (37.2 C)  SpO2: 98% 100%    Intake/Output Summary (Last 24 hours) at 06/25/2022 1955 Last data filed at 06/25/2022 1600 Gross per 24 hour  Intake 290 ml  Output --  Net 290 ml    Filed Weights   06/21/22 0500 06/23/22 0218 06/24/22 0337  Weight: 87.9 kg 85.1 kg 84.8 kg   Body mass index is 31.11 kg/m.  Exam:  General: Alert and oriented, no acute distress HEENT: Periorbital swelling significantly improving, scalp laceratioon Cardiovascular: Regular rate and rhythm, S1-S2 Respiratory: Clear to auscultation bilaterally Abdomen: soft, non-tender, nondistended, positive bowel sounds  Musculoskeletal: No clubbing or cyanosis, or edema  Skin: bruising as above Psychiatry: Appropriate, no evidence of psychoses Neurology: none  Data Reviewed: Stable labs today  Disposition:  Status is: Inpatient Remains inpatient appropriate because: Completion of antibiotics, setting up home health    Anticipated discharge date: 7/4, discharged home with home health    Family Communication: Left message for family DVT Prophylaxis: enoxaparin (LOVENOX) injection 40 mg Start: 06/21/22 2200 SCDs Start: 06/17/22 0455    Author: Sendil K Krishnan ,MD 06/25/2022 7:55 PM  To reach On-call, see care teams to locate the attending and reach out via [email or web address]. Between 7PM-7AM, please contact night-coverage If you still have difficulty reaching the attending provider, please page the DOC (Director on Call) for [facility] on amion for  assistance.  

## 2022-06-25 NOTE — Progress Notes (Signed)
Date of Admission:  06/16/2022    Antibiotics PEN G 6/25-6/26 6/28-7/2 Ceftriaxone  6/25 -1 dose Piperacillin/tazo 6/26-6/29 Linezolid 6/27-6/29 Linezolid 7/2   ID: Anna Lucas is a 51 y.o. female Active Problems:   Anxiety and depression   Peripheral neuropathy   Alcohol abuse   Hypomagnesemia   CAP (community acquired pneumonia)   Toxic shock (HCC)   Hypokalemia   Facial trauma, subsequent encounter   Obesity (BMI 30-39.9)   Yeast infection    Subjective: Out of ICU Says she is feleing better Except eyes watering  She has been restarted on Linzoled yesterday for staph hominis and staph epidermidis from eye f culture which are both skin bacteria  The hospitalist Stopped pencilllin and switched to Po linezolid  Medications:   Chlorhexidine Gluconate Cloth  6 each Topical Daily   citalopram  40 mg Oral Daily   docusate sodium  100 mg Oral BID   enoxaparin (LOVENOX) injection  40 mg Subcutaneous QHS   erythromycin   Both Eyes QID   feeding supplement  237 mL Oral TID BM   folic acid  1 mg Oral Daily   gabapentin  300 mg Oral Q1200   insulin aspart  0-9 Units Subcutaneous Q4H   linezolid  600 mg Oral Q12H   multivitamin with minerals  1 tablet Oral Daily   pantoprazole  40 mg Oral Daily   phenobarbital  32.4 mg Oral TID   [START ON 06/26/2022] predniSONE  40 mg Oral Q breakfast   Followed by   Melene Muller ON 06/27/2022] predniSONE  20 mg Oral Q breakfast   Followed by   Melene Muller ON 06/29/2022] predniSONE  10 mg Oral Q breakfast   thiamine  100 mg Oral Daily   Or   thiamine  100 mg Intravenous Daily   traZODone  50 mg Oral QHS    Objective: Vital signs in last 24 hours: Temp:  [98.4 F (36.9 C)-99.1 F (37.3 C)] 99 F (37.2 C) (07/03 1616) Pulse Rate:  [71-83] 75 (07/03 1616) Resp:  [15-18] 18 (07/03 1616) BP: (125-168)/(66-93) 130/78 (07/03 1616) SpO2:  [94 %-100 %] 100 % (07/03 1616)    PHYSICAL EXAM:  General: Alert, cooperative, no distress,  Opening  rt eye And slightly the left eye as well  Edema and erythema left upper lid with scab 06/24/22   06/20/22   Lungs: Clear to auscultation bilaterally. No Wheezing or Rhonchi. No rales. Heart: Regular rate and rhythm, no murmur, rub or gallop. Abdomen: Soft, non-tender,not distended. Bowel sounds normal. No masses Extremities: atraumatic, no cyanosis. No edema. No clubbing Skin: No rashes or lesions. Or bruising Lymph: Cervical, supraclavicular normal. Neurologic: Grossly non-focal  Lab Results Recent Labs    06/24/22 0508 06/25/22 0409  WBC 8.8 7.5  HGB 9.3* 9.3*  HCT 27.9* 28.7*  NA 130* 133*  K 3.8 3.9  CL 97* 97*  CO2 27 27  BUN 10 12  CREATININE 0.61 0.57    Microbiology: 06/17/22- BC GAS 06/19/22 BC- NG 06/19/22 Eye culture staph epi and hominins Studies/Results: No results found.   Assessment/Plan: Group A streptococcal bacteremia with toxic shock syndrome resolved- she got IV penicillin ( 7 days) 3 doses of IVIG and 4 days of linezolid for antitoxin effect  B/l eye lid infection with scabs - left > rt- culture from left eye is staph epi and hominis 2 skin bacteria- Antibiotic switched from PCN to linezolid. The latter should treat the above organisms and also Group  A strep Continue for 7 days If her condition worsens then would add ceftriaxone  Facial trauma   Alcohol abuse Was on precedex drip for DTS but stable now  Discussed the management with patient- told her not to rub her eyes ID will follow her peripherally tomorrow. Call if needed

## 2022-06-25 NOTE — Progress Notes (Signed)
Occupational Therapy Treatment Patient Details Name: Anna Lucas MRN: [id number] DOB: [birth year] Today's Date: 06/25/2022   History of present illness Pt is a 51 y/o F admitted on 06/16/22 after presenting to the ED with c/o worsening periorbital swelling & pain s/p fall on 06/15/22 (where pt found to have L occipital condyle fx). CT showed probable PNA & was started on antibiotics. On 06/17/22 pt was noted to be hypotensive & agitated with worsening mental status so tranferred to ICU. Pt was intubated 06/18/22 & extubated 06/21/22. PMH: EtOH abuse, chronic thrombocytopenia, hyponatremia, anxiety, depression, PTSD, HTN, hepatitis   OT comments  Upon entering the room, pt supine in bed and agreeable to OT intervention. Pt is does report 8/10 headache pain but given medication prior to therapist arrival. Pt performs bed mobility with increased time and HOB elevated but no physical assistance. Pt stands with supervision and transfers to [facility] with min guard and no use of AD. Pt able to perform hygiene and clothing management with min guard. Pt stands at sink with min guard to brush teeth and then returns to bed. All needs within reach. Pt making excellent progress towards goals and recommendation for follow up therapy changed to [facility].    Recommendations for follow up therapy are one component of a multi-disciplinary discharge planning process, led by the attending physician.  Recommendations may be updated based on patient status, additional functional criteria and insurance authorization.    Follow Up Recommendations  Home health OT    Assistance Recommended at Discharge Intermittent Supervision/Assistance  Patient can return home with the following  A little help with bathing/dressing/bathroom;A little help with walking and/or transfers;Direct supervision/assist for medications management;Direct supervision/assist for financial management;Help with stairs or ramp for entrance   Equipment Recommendations   None recommended by OT       Precautions / Restrictions Precautions Precautions: Fall;Cervical Required Braces or Orthoses: Cervical Brace Cervical Brace: Soft collar;At all times Restrictions Weight Bearing Restrictions: No       Mobility Bed Mobility Overal bed mobility: Modified Independent Bed Mobility: Supine to Sit, Sit to Supine           General bed mobility comments: no physical assistance provided    Transfers Overall transfer level: Needs assistance Equipment used: None Transfers: Sit to/from Stand Sit to Stand: Supervision Stand pivot transfers: Min guard   Step pivot transfers: Min guard           Balance Overall balance assessment: Needs assistance Sitting-balance support: Feet supported, Bilateral upper extremity supported Sitting balance-Leahy Scale: Good     Standing balance support: During functional activity, No upper extremity supported Standing balance-Leahy Scale: Fair                             ADL either performed or assessed with clinical judgement   ADL Overall ADL's : Needs assistance/impaired     Grooming: Wash/dry hands;Wash/dry face;Oral care;Standing;Min guard                   Toilet Transfer: Lawyer and Hygiene: Min guard;Sit to/from stand       Functional mobility during ADLs: Min guard;Supervision/safety      Extremity/Trunk Assessment Upper Extremity Assessment Upper Extremity Assessment: Generalized weakness;Overall [facility] for tasks assessed   Lower Extremity Assessment Lower Extremity Assessment: Overall WFL for tasks assessed;Generalized weakness        Vision Patient Visual Report: No change  from baseline            Cognition Arousal/Alertness: Awake/alert   Overall Cognitive Status: Impaired/Different from baseline Area of Impairment: Safety/judgement                       Following Commands: Follows one step commands  consistently Safety/Judgement: Decreased awareness of deficits, Decreased awareness of safety     General Comments: .                   Pertinent Vitals/ Pain       Pain Assessment Pain Assessment: 0-10 Pain Score: 8  Pain Location: headache Pain Descriptors / Indicators: Discomfort, Grimacing Pain Intervention(s): Premedicated before session, Monitored during session         Frequency  Min 3X/week        Progress Toward Goals  OT Goals(current goals can now be found in the care plan section)  Progress towards OT goals: Progressing toward goals  Acute Rehab OT Goals Patient Stated Goal: to decrease headache pain Time For Goal Achievement: 07/06/22 Potential to Achieve Goals: Good  Plan Discharge plan needs to be updated;Frequency needs to be updated       AM-PAC OT "6 Clicks" Daily Activity     Outcome Measure   Help from another person eating meals?: None Help from another person taking care of personal grooming?: A Little Help from another person toileting, which includes using toliet, bedpan, or urinal?: A Little Help from another person bathing (including washing, rinsing, drying)?: A Little Help from another person to put on and taking off regular upper body clothing?: None Help from another person to put on and taking off regular lower body clothing?: A Little 6 Click Score: 20    End of Session    OT Visit Diagnosis: Unsteadiness on feet (R26.81);Muscle weakness (generalized) (M62.81);Other symptoms and signs involving cognitive function   Activity Tolerance Patient tolerated treatment well   Patient Left in bed;with call bell/phone within reach;with bed alarm set   Nurse Communication Mobility status        Time: 7124-5809 OT Time Calculation (min): 10 min  Charges: OT General Charges $OT Visit: 1 Visit OT Treatments $Self Care/Home Management : 8-22 mins Jackquline Denmark, MS, OTR/L , CBIS ascom [phone]  06/25/22, 1:10 PM

## 2022-06-25 NOTE — TOC Initial Note (Addendum)
Transition of Care [facility]) - Initial/Assessment Note    Patient Details  Name: Anna Lucas MRN: [id number] Date of Birth: September 10, 1971  Transition of Care [facility] CM/SW Contact:    Maree Krabbe, LCSW Phone Number: 06/25/2022, 12:25 PM  Clinical Narrative:       CSW spoke with pt regarding dc plan. Pt states she will be moving [place] with her Mom at [employer]. Pt states she has not had HH before. [facility] can [facility] that area and pt is agreeable. Barbara Cower has sent referral to that branch.         [facility] will accept. Will need HH RN PT and SW orders at dc.  Expected Discharge Plan: Home w Home Health Services Barriers to Discharge: Continued Medical Work up   Patient Goals and CMS Choice Patient states their goals for this hospitalization and ongoing recovery are:: To get better CMS Medicare.gov Compare Post Acute Care list provided to:: Patient Choice offered to / list presented to : Patient  Expected Discharge Plan and Services Expected Discharge Plan: Home w Home Health Services In-house Referral: Clinical Social Work Discharge Planning Services: CM Consult Post Acute Care Choice: Home Health Living arrangements for the past 2 months: Single Family Home                 DME Arranged: N/A DME Agency: NA       HH Arranged: PT HH Agency: [facility] Date HH Agency Contacted: 06/25/22 Time HH Agency Contacted: 1225 Representative spoke with at [facility] Agency: jason  Prior Living Arrangements/Services Living arrangements for the past 2 months: Single Family Home Lives with:: Parents Patient language and need for interpreter reviewed:: Yes Do you feel safe going back to the place where you live?: Yes      Need for Family Participation in Patient Care: Yes (Comment) Care giver support system in place?: Yes (comment)   Criminal Activity/Legal Involvement Pertinent to Current Situation/Hospitalization: No - Comment as needed  Activities of Daily Living Home  Assistive Devices/Equipment: None ADL Screening (condition at time of admission) Patient's cognitive ability adequate to safely complete daily activities?: Yes Is the patient deaf or have difficulty hearing?: No Does the patient have difficulty seeing, even when wearing glasses/contacts?: Yes Does the patient have difficulty concentrating, remembering, or making decisions?: Yes Patient able to express need for assistance with ADLs?: Yes Does the patient have difficulty dressing or bathing?: Yes Independently performs ADLs?: No Communication: Independent Dressing (OT): Needs assistance Is this a change from baseline?: Change from baseline, expected to last <3days Grooming: Needs assistance Is this a change from baseline?: Change from baseline, expected to last <3 days Feeding: Independent Bathing: Needs assistance Is this a change from baseline?: Change from baseline, expected to last <3 days Toileting: Needs assistance Is this a change from baseline?: Change from baseline, expected to last <3 days In/Out Bed: Needs assistance Is this a change from baseline?: Change from baseline, expected to last <3 days Walks in Home: Independent Does the patient have difficulty walking or climbing stairs?: Yes Weakness of Legs: Both Weakness of Arms/Hands: Both  Permission Sought/Granted Permission sought to share information with : Family Supports    Share Information with NAME: Jonny Ruiz     Permission granted to share info w Relationship: father  Permission granted to share info w Contact Information: [phone]  Emotional Assessment Appearance:: Appears stated age Attitude/Demeanor/Rapport: Engaged Affect (typically observed): Accepting Orientation: : Oriented to  Time, Oriented to Place, Oriented to Self,  Oriented to Situation Alcohol / Substance Use: Not Applicable Psych Involvement: No (comment)  Admission diagnosis:  Facial swelling [R22.0] CAP (community acquired pneumonia)  [J18.9] Fall, initial encounter [W19.XXXA] Sepsis (HCC) [A41.9] Community acquired pneumonia, unspecified laterality [J18.9] Sepsis, due to unspecified organism, unspecified whether acute organ dysfunction present [facility]) [A41.9] Patient Active Problem List   Diagnosis Date Noted   Yeast infection 06/24/2022   Obesity (BMI 30-39.9) 06/22/2022   Facial trauma, subsequent encounter    CAP (community acquired pneumonia) 06/17/2022   Toxic shock (HCC) 06/17/2022   Hypokalemia 06/17/2022   Facial swelling    Elevated ferritin 05/13/2022   Hypomagnesemia 05/12/2022   Hypoalbuminemia 05/12/2022   Leukopenia 05/12/2022   Myelosuppression 05/12/2022   Acute hepatitis 05/11/2022   Anxiety and depression 05/11/2022   GERD (gastroesophageal reflux disease) 05/11/2022   Peripheral neuropathy 05/11/2022   Alcohol abuse 05/11/2022   Thrombocytopenia (HCC) 05/11/2022   Colon cancer screening 02/07/2021   Intractable vomiting 08/17/2016   Pyelonephritis 12/19/2015   PCP:  [facility] Pharmacy:   [facility] - [place], [place] - [address] [place] [address] Phone: [phone] Fax: [phone]     Social Determinants of Health (SDOH) Interventions    Readmission Risk Interventions     No data to display

## 2022-06-26 DIAGNOSIS — R220 Localized swelling, mass and lump, head: Secondary | ICD-10-CM | POA: Diagnosis not present

## 2022-06-26 DIAGNOSIS — F10931 Alcohol use, unspecified with withdrawal delirium: Secondary | ICD-10-CM | POA: Diagnosis not present

## 2022-06-26 DIAGNOSIS — A400 Sepsis due to streptococcus, group A: Secondary | ICD-10-CM | POA: Diagnosis not present

## 2022-06-26 DIAGNOSIS — E669 Obesity, unspecified: Secondary | ICD-10-CM | POA: Diagnosis not present

## 2022-06-26 LAB — GLUCOSE, CAPILLARY
Glucose-Capillary: 103 mg/dL — ABNORMAL HIGH (ref 70–99)
Glucose-Capillary: 111 mg/dL — ABNORMAL HIGH (ref 70–99)
Glucose-Capillary: 129 mg/dL — ABNORMAL HIGH (ref 70–99)
Glucose-Capillary: 193 mg/dL — ABNORMAL HIGH (ref 70–99)
Glucose-Capillary: 80 mg/dL (ref 70–99)
Glucose-Capillary: 86 mg/dL (ref 70–99)

## 2022-06-26 LAB — PHOSPHORUS: Phosphorus: 4.3 mg/dL (ref 2.5–4.6)

## 2022-06-26 LAB — MAGNESIUM: Magnesium: 1.9 mg/dL (ref 1.7–2.4)

## 2022-06-26 MED ORDER — HYDROXYZINE HCL 50 MG PO TABS
50.0000 mg | ORAL_TABLET | Freq: Three times a day (TID) | ORAL | Status: DC | PRN
Start: 2022-06-26 — End: 2022-06-28
  Administered 2022-06-26 – 2022-06-27 (×2): 50 mg via ORAL
  Filled 2022-06-26 (×2): qty 1

## 2022-06-26 MED ORDER — OXYCODONE HCL 5 MG PO TABS
5.0000 mg | ORAL_TABLET | Freq: Four times a day (QID) | ORAL | Status: DC | PRN
  Administered 2022-06-26 – 2022-06-28 (×6): 5 mg via ORAL
  Filled 2022-06-26 (×6): qty 1

## 2022-06-26 NOTE — Progress Notes (Incomplete)
[facility] Progress Note  Patient: Anna Lucas    NWG:[id number]  DOA: 06/16/2022    Date of Service: the patient was seen and examined on 06/26/2022  Brief hospital course: 51 year old female with past medical history of PTSD, hypertension and chronic alcohol abuse with secondary thrombocytopenia who presented to the emergency room at [facility] on 6/23 after a fall and at that time had sustained a left occipital condyle fracture as well as right frontal scalp hematoma and right forehead scalp laceration.  Patient was seen and repaired by plastic surgery in the emergency room and discharged on Augmentin.  She presented to [facility] emergency room on 6/24 with worsening periorbital swelling and pain.  Lab work was unremarkable however CT of the chest/abdomen/pelvis noted probable pneumonia patient was admitted to the hospitalist service for sepsis from pneumonia and started on broad-spectrum antibiotics.  Patient was seen by ophthalmology for her facial hematoma recommended ice packs.  Blood cultures at that time came back positive for strep pyogenes, felt to be secondary to facial cellulitis.  Infectious disease consulted.  On the night of 6/25 patient became hypotensive and then confused as well as having fevers and then transferred to the ICU for Precedex drip.  Due to obstructive neck edema patient was intubated for airway protection.  Decompensation for to be secondary to alcohol withdrawal.  Patient able to be successfully extubated on 6/29 and transferred to hospitalist service starting 6/30.  Assessment and Plan: Assessment and Plan: * Sepsis due to Streptococcus pyogenes (HCC)-resolved as of 06/23/2022 Patient met criteria for severe sepsis on admission given leukocytosis, tachycardia, tachypnea with source being facial cellulitis cellulitis, and lactic acidosis.  Infectious disease following.  Sepsis is now stabilized.  Initially continued on penicillin and Zyvox stopped.  Tissue cultures grew out  penicillin resistant strep hominis and epidermis.  Zyvox restarted and penicillin stopped.  Complete 7 days of Zyvox on 7/4.  Toxic shock (HCC)-resolved as of 06/26/2022 Appreciate infectious disease help.  Antitoxin effect given from Zyvox.  Acute metabolic encephalopathy-resolved as of 06/23/2022 Multifactorial, initially from sepsis and then alcohol withdrawal.  Now still with some confusion although patient does answer some questions appropriately and this may be more delirium from sleep deprivation.  This looks to have fully resolved.  Ammonia level normal.  Alcohol abuse Status post alcohol withdrawals.  Was on Precedex drip.  As needed Ativan for now.  Based off of previous hospital records, had some hepatic steatosis and thrombocytopenia, but no evidence of cirrhosis.  On scheduled phenobarbital  Alcohol withdrawal delirium, persistent, hyperactive (HCC)-resolved as of 06/23/2022 Off of Precedex.  Extubated from ventilator, more for airway protection from edema from facial trauma  Facial trauma, subsequent encounter With forehead laceration, periorbital bruising and cellulitis, scalp hematoma.  Last ophthalmology to evaluate eyes his left eyelid appears to have some sloughing..  Improving, now no longer needs inpatient rehab.  For home health.  Anxiety and depression Continue home medications  Peripheral neuropathy Continue Neurontin  Obesity (BMI 30-39.9) Meets criteria BMI greater than 30  Yeast infection-resolved as of 06/26/2022 Patient complained of some burning when urinating and itching in her groin that has started over the last 24 hours.  She states she gets like this whenever she gets yeast infection that occurs pretty much every time she is on antibiotics.  Diflucan x1 on 7/2.    Hypokalemia-resolved as of 06/26/2022 Replacing as needed  Hypomagnesemia-resolved as of 06/26/2022 Replacing as needed       Body mass index  is 25.79 kg/m.  Nutrition Problem: Inadequate  oral intake Etiology: inability to eat     Consultants: Critical Care Ophthalmology ID   Procedures: Ventilator support 6/26-6/29   Antimicrobials: Zyvox 6/27-6/30, 7/2-present IV PCN 6/27-7/2 Diflucan x1 on 7/2   Code Status: Full Code    Subjective:   Objective: Vital signs were reviewed and unremarkable. Vitals:   06/26/22 0742 06/26/22 1556  BP: (!) 160/96 (!) 153/87  Pulse: 75 74  Resp: 18 18  Temp: 98.5 F (36.9 C) 99.1 F (37.3 C)  SpO2: 99% 98%   No intake or output data in the 24 hours ending 06/26/22 1808  Filed Weights   06/23/22 0218 06/24/22 0337 06/26/22 0509  Weight: 85.1 kg 84.8 kg 70.3 kg   Body mass index is 25.79 kg/m.  Exam:  General: Alert and oriented, no acute distress HEENT: Periorbital swelling significantly improving, scalp laceratioon Cardiovascular: Regular rate and rhythm, S1-S2 Respiratory: Clear to auscultation bilaterally Abdomen: soft, non-tender, nondistended, positive bowel sounds  Musculoskeletal: No clubbing or cyanosis, or edema  Skin: bruising as above Psychiatry: Appropriate, no evidence of psychoses Neurology: none  Data Reviewed: Stable labs today  Disposition:  Status is: Inpatient Remains inpatient appropriate because: Completion of antibiotics, setting up home health    Anticipated discharge date: 7/4, discharged home with home health    Family Communication: Left message for family DVT Prophylaxis: enoxaparin (LOVENOX) injection 40 mg Start: 06/21/22 2200 SCDs Start: 06/17/22 0455    Author: Annita Brod ,MD 06/26/2022 6:08 PM  To reach On-call, see care teams to locate the attending and reach out via [email or web address]. Between 7PM-7AM, please contact night-coverage If you still have difficulty reaching the attending provider, please page the [facility] (Director on Call) for [facility] on amion for assistance.

## 2022-06-26 NOTE — Progress Notes (Signed)
[facility] Progress Note  Patient: Anna Lucas    NWG:[id number]  DOA: 06/16/2022    Date of Service: the patient was seen and examined on 06/26/2022  Brief hospital course: 51 year old female with past medical history of PTSD, hypertension and chronic alcohol abuse with secondary thrombocytopenia who presented to the emergency room at [facility] on 6/23 after a fall and at that time had sustained a left occipital condyle fracture as well as right frontal scalp hematoma and right forehead scalp laceration.  Patient was seen and repaired by plastic surgery in the emergency room and discharged on Augmentin.  She presented to [facility] emergency room on 6/24 with worsening periorbital swelling and pain.  Lab work was unremarkable however CT of the chest/abdomen/pelvis noted probable pneumonia patient was admitted to the hospitalist service for sepsis from pneumonia and started on broad-spectrum antibiotics.  Patient was seen by ophthalmology for her facial hematoma recommended ice packs.  Blood cultures at that time came back positive for strep pyogenes, felt to be secondary to facial cellulitis.  Infectious disease consulted.  On the night of 6/25 patient became hypotensive and then confused as well as having fevers and then transferred to the ICU for Precedex drip.  Due to obstructive neck edema patient was intubated for airway protection.  Decompensation for to be secondary to alcohol withdrawal.  Patient able to be successfully extubated on 6/29 and transferred to hospitalist service starting 6/30.  Assessment and Plan: Assessment and Plan: * Sepsis due to Streptococcus pyogenes (HCC)-resolved as of 06/23/2022 Patient met criteria for severe sepsis on admission given leukocytosis, tachycardia, tachypnea with source being facial cellulitis cellulitis, and lactic acidosis.  Infectious disease following.  Sepsis is now stabilized.  Initially continued on penicillin and Zyvox stopped.  Tissue cultures grew out  penicillin resistant strep hominis and epidermis.  Zyvox restarted and penicillin stopped.  Complete 7 days of Zyvox on 7/4.  Toxic shock (HCC)-resolved as of 06/26/2022 Appreciate infectious disease help.  Antitoxin effect given from Zyvox.  Acute metabolic encephalopathy-resolved as of 06/23/2022 Multifactorial, initially from sepsis and then alcohol withdrawal.  Now still with some confusion although patient does answer some questions appropriately and this may be more delirium from sleep deprivation.  This looks to have fully resolved.  Ammonia level normal.  Alcohol abuse Status post alcohol withdrawals.  Was on Precedex drip.  As needed Ativan for now.  Based off of previous hospital records, had some hepatic steatosis and thrombocytopenia, but no evidence of cirrhosis.  On scheduled phenobarbital  Alcohol withdrawal delirium, persistent, hyperactive (HCC)-resolved as of 06/23/2022 Off of Precedex.  Extubated from ventilator, more for airway protection from edema from facial trauma  Facial trauma, subsequent encounter With forehead laceration, periorbital bruising and cellulitis, scalp hematoma.  Last ophthalmology to evaluate eyes his left eyelid appears to have some sloughing..  Improving, now no longer needs inpatient rehab.  For home health.  Anxiety and depression Continue home medications  Peripheral neuropathy Continue Neurontin  Obesity (BMI 30-39.9) Meets criteria BMI greater than 30  Yeast infection-resolved as of 06/26/2022 Patient complained of some burning when urinating and itching in her groin that has started over the last 24 hours.  She states she gets like this whenever she gets yeast infection that occurs pretty much every time she is on antibiotics.  Diflucan x1 on 7/2.    Hypokalemia-resolved as of 06/26/2022 Replacing as needed  Hypomagnesemia-resolved as of 06/26/2022 Replacing as needed       Body mass index  is 25.79 kg/m.  Nutrition Problem: Inadequate  oral intake Etiology: inability to eat     Consultants: Critical Care Ophthalmology ID   Procedures: Ventilator support 6/26-6/29   Antimicrobials: Zyvox 6/27-6/30, 7/2-present IV PCN 6/27-7/2 Diflucan x1 on 7/2   Code Status: Full Code    Subjective: Doing okay, no complaints care  Objective: Vital signs were reviewed and unremarkable. Vitals:   06/26/22 0742 06/26/22 1556  BP: (!) 160/96 (!) 153/87  Pulse: 75 74  Resp: 18 18  Temp: 98.5 F (36.9 C) 99.1 F (37.3 C)  SpO2: 99% 98%   No intake or output data in the 24 hours ending 06/26/22 1810  Filed Weights   06/23/22 0218 06/24/22 0337 06/26/22 0509  Weight: 85.1 kg 84.8 kg 70.3 kg   Body mass index is 25.79 kg/m.  Exam:  General: Alert and oriented, no acute distress HEENT: Periorbital swelling significantly almost fully resolved on right, much improved on left, scalp laceratioon Cardiovascular: Regular rate and rhythm, S1-S2 Respiratory: Clear to auscultation bilaterally Abdomen: soft, non-tender, nondistended, positive bowel sounds  Musculoskeletal: No clubbing or cyanosis, or edema  Skin: bruising as above Psychiatry: Appropriate, no evidence of psychoses Neurology: none  Data Reviewed: No labs today  Disposition:  Status is: Inpatient Remains inpatient appropriate because: Patient is staying with her parents who will be back in town on Thursday, 7/6    Anticipated discharge date: 7/6    Family Communication: Left message for family DVT Prophylaxis: enoxaparin (LOVENOX) injection 40 mg Start: 06/21/22 2200 SCDs Start: 06/17/22 0455    Author: Annita Brod ,MD 06/26/2022 6:10 PM  To reach On-call, see care teams to locate the attending and reach out via [email or web address]. Between 7PM-7AM, please contact night-coverage If you still have difficulty reaching the attending provider, please page the [facility] (Director on Call) for [facility] on amion for assistance.

## 2022-06-26 NOTE — Plan of Care (Signed)

## 2022-06-27 ENCOUNTER — Other Ambulatory Visit (HOSPITAL_COMMUNITY): Payer: Self-pay

## 2022-06-27 ENCOUNTER — Telehealth (HOSPITAL_COMMUNITY): Payer: Self-pay | Admitting: Pharmacy Technician

## 2022-06-27 DIAGNOSIS — A483 Toxic shock syndrome: Secondary | ICD-10-CM | POA: Diagnosis not present

## 2022-06-27 DIAGNOSIS — F1010 Alcohol abuse, uncomplicated: Secondary | ICD-10-CM | POA: Diagnosis not present

## 2022-06-27 DIAGNOSIS — F10931 Alcohol use, unspecified with withdrawal delirium: Secondary | ICD-10-CM | POA: Diagnosis not present

## 2022-06-27 DIAGNOSIS — R220 Localized swelling, mass and lump, head: Secondary | ICD-10-CM | POA: Diagnosis not present

## 2022-06-27 DIAGNOSIS — E669 Obesity, unspecified: Secondary | ICD-10-CM | POA: Diagnosis not present

## 2022-06-27 DIAGNOSIS — L03211 Cellulitis of face: Secondary | ICD-10-CM | POA: Diagnosis not present

## 2022-06-27 DIAGNOSIS — A491 Streptococcal infection, unspecified site: Secondary | ICD-10-CM | POA: Diagnosis not present

## 2022-06-27 DIAGNOSIS — A400 Sepsis due to streptococcus, group A: Secondary | ICD-10-CM | POA: Diagnosis not present

## 2022-06-27 LAB — GLUCOSE, CAPILLARY
Glucose-Capillary: 108 mg/dL — ABNORMAL HIGH (ref 70–99)
Glucose-Capillary: 125 mg/dL — ABNORMAL HIGH (ref 70–99)
Glucose-Capillary: 87 mg/dL (ref 70–99)
Glucose-Capillary: 95 mg/dL (ref 70–99)
Glucose-Capillary: 96 mg/dL (ref 70–99)

## 2022-06-27 LAB — PHOSPHORUS: Phosphorus: 4.8 mg/dL — ABNORMAL HIGH (ref 2.5–4.6)

## 2022-06-27 LAB — MAGNESIUM: Magnesium: 2 mg/dL (ref 1.7–2.4)

## 2022-06-27 NOTE — Progress Notes (Signed)
Physical Therapy Treatment Patient Details Name: Anna Lucas MRN: [id number] DOB: 1971-09-09 Today's Date: 06/27/2022   History of Present Illness Anna Lucas is a 51 y/o F admitted on 06/16/22 after presenting to the ED with c/o worsening periorbital swelling & pain s/p fall on 06/15/22 (where pt found to have L occipital condyle fx). CT showed probable PNA & was started on antibiotics. On 06/17/22 pt was noted to be hypotensive & agitated with worsening mental status so tranferred to ICU. Pt was intubated 06/18/22 & extubated 06/21/22. PMH: EtOH abuse, chronic thrombocytopenia, hyponatremia, anxiety, depression, PTSD, HTN, hepatitis.    PT Comments    Pt reports to continue to be moving better, feeling better. Pt has been AMB multiple times per day, has showered twice. Today she demonstrates >382f AMB without device, lateral and backward AMB without LOB. Pt scores a 56/56 on Berg Balance Test. Pt is now managing her mobility in room. As pt is now independent with basic mobility needs, PT signing off at this time. All goals of therapy met.      Recommendations for follow up therapy are one component of a multi-disciplinary discharge planning process, led by the attending physician.  Recommendations may be updated based on patient status, additional functional criteria and insurance authorization.  Follow Up Recommendations  No PT follow up     Assistance Recommended at Discharge Frequent or constant Supervision/Assistance  Patient can return home with the following     Equipment Recommendations  None recommended by PT    Recommendations for Other Services       Precautions / Restrictions Precautions Precautions: Fall;Cervical Cervical Brace: Soft collar;At all times Restrictions Weight Bearing Restrictions: No     Mobility  Bed Mobility Overal bed mobility: Independent Bed Mobility: Supine to Sit, Sit to Supine     Supine to sit: Independent Sit to supine: Independent         Transfers Overall transfer level: Independent   Transfers: Sit to/from Stand Sit to Stand: Independent                Ambulation/Gait Ambulation/Gait assistance: Independent Gait Distance (Feet): 350 Feet Assistive device: None Gait Pattern/deviations: WFL(Within Functional Limits) Gait velocity: 0.933m         Stairs             Wheelchair Mobility    Modified Rankin (Stroke Patients Only)       Balance                                 Standardized Balance Assessment Standardized Balance Assessment : Berg Balance Test Berg Balance Test Sit to Stand: Able to stand without using hands and stabilize independently Standing Unsupported: Able to stand safely 2 minutes Sitting with Back Unsupported but Feet Supported on Floor or Stool: Able to sit safely and securely 2 minutes Stand to Sit: Sits safely with minimal use of hands Transfers: Able to transfer safely, minor use of hands Standing Unsupported with Eyes Closed: Able to stand 10 seconds safely Standing Ubsupported with Feet Together: Able to place feet together independently and stand 1 minute safely From Standing, Reach Forward with Outstretched Arm: Can reach confidently >25 cm (10") From Standing Position, Pick up Object from Floor: Able to pick up shoe safely and easily From Standing Position, Turn to Look Behind Over each Shoulder: Looks behind from both sides and weight shifts well Turn 360 Degrees: Able to  turn 360 degrees safely in 4 seconds or less Standing Unsupported, Alternately Place Feet on Step/Stool: Able to stand independently and safely and complete 8 steps in 20 seconds Standing Unsupported, One Foot in Front: Able to place foot tandem independently and hold 30 seconds Standing on One Leg: Able to lift leg independently and hold > 10 seconds Total Score: 56        Cognition                                                Exercises Other  Exercises Other Exercises: lateral sidestepping in hallway 1x53fbilat (no difficulty, no LOB) Other Exercises: backwards walking in hallway 1x357f no LOB, mild difficulty    General Comments        Pertinent Vitals/Pain Pain Assessment Pain Assessment: Faces Faces Pain Scale: Hurts little more (HA)    Home Living                          Prior Function            PT Goals (current goals can now be found in the care plan section) Acute Rehab PT Goals Patient Stated Goal: get better PT Goal Formulation: With patient Time For Goal Achievement: 07/06/22 Potential to Achieve Goals: Fair Progress towards PT goals: Goals met/education completed, patient discharged from PT    Frequency    Min 2X/week      PT Plan Frequency needs to be updated;Discharge plan needs to be updated    Co-evaluation              AM-PAC PT "6 Clicks" Mobility   Outcome Measure  Help needed turning from your back to your side while in a flat bed without using bedrails?: None Help needed moving from lying on your back to sitting on the side of a flat bed without using bedrails?: None Help needed moving to and from a bed to a chair (including a wheelchair)?: A Little Help needed standing up from a chair using your arms (e.g., wheelchair or bedside chair)?: A Little Help needed to walk in hospital room?: A Little Help needed climbing 3-5 steps with a railing? : A Little 6 Click Score: 20    End of Session Equipment Utilized During Treatment: Gait belt Activity Tolerance: Patient tolerated treatment well;No increased pain Patient left: with chair alarm set;with call bell/phone within reach;in bed Nurse Communication: Mobility status PT Visit Diagnosis: Unsteadiness on feet (R26.81);Muscle weakness (generalized) (M62.81);Difficulty in walking, not elsewhere classified (R26.2);Other abnormalities of gait and mobility (R26.89)     Time: 147741-2878T Time Calculation (min)  (ACUTE ONLY): 23 min  Charges:  $Neuromuscular Re-education: 23-37 mins                    .2:59 PM, 06/27/22 AlEtta GrandchildPT, DPT Physical Therapist - [phone]     [place] 06/27/2022, 2:51 PM

## 2022-06-27 NOTE — Progress Notes (Signed)
   Date of Admission:  06/16/2022      ID: Anna Lucas is a 51 y.o. female Active Problems:   Anxiety and depression   Peripheral neuropathy   Alcohol abuse   CAP (community acquired pneumonia)   Facial trauma, subsequent encounter   Obesity (BMI 30-39.9)  Antibiotics Day 10 Currently on linezolid Subjective: Feeling better Opening eyes b/l Sitting an talking No distress Medications:   Chlorhexidine Gluconate Cloth  6 each Topical Daily   citalopram  40 mg Oral Daily   docusate sodium  100 mg Oral BID   enoxaparin (LOVENOX) injection  40 mg Subcutaneous QHS   erythromycin   Both Eyes QID   folic acid  1 mg Oral Daily   gabapentin  300 mg Oral Q1200   insulin aspart  0-9 Units Subcutaneous Q4H   linezolid  600 mg Oral Q12H   multivitamin with minerals  1 tablet Oral Daily   pantoprazole  40 mg Oral Daily   predniSONE  20 mg Oral Q breakfast   Followed by   Melene Muller ON 06/29/2022] predniSONE  10 mg Oral Q breakfast   thiamine  100 mg Oral Daily   Or   thiamine  100 mg Intravenous Daily   traZODone  50 mg Oral QHS    Objective: Vital signs in last 24 hours: Temp:  [98.4 F (36.9 C)-99.1 F (37.3 C)] 98.4 F (36.9 C) (07/05 0543) Pulse Rate:  [74-79] 75 (07/05 0543) Resp:  [16-18] 16 (07/05 0543) BP: (117-153)/(80-93) 140/93 (07/05 0543) SpO2:  [98 %-99 %] 99 % (07/05 0543) Weight:  [72.7 kg] 72.7 kg (07/05 0500)    PHYSICAL EXAM:  General: awake and alert, no distress  Swelling of the forehead on the rt side- below the steristrips Left upper lid has erythema, scab - superficial Swelling reduced bl Eyes open 06/27/22   06/25/22    06/20/22   Lungs: b/l air entry Heart: s1s2 Abdomen: Soft, non-tender,not distended. Bowel sounds normal. No masses Extremities: atraumatic, no cyanosis. No edema. No clubbing Skin: No rashes or lesions. Or bruising Lymph: Cervical, supraclavicular normal. Neurologic: Grossly non-focal  Lab Results Recent Labs     06/25/22 0409  WBC 7.5  HGB 9.3*  HCT 28.7*  NA 133*  K 3.9  CL 97*  CO2 27  BUN 12  CREATININE 0.57    Microbiology: 06/17/22- BC GAS 06/19/22 BC- NG 06/19/22 Eye culture staph epi and hominins Studies/Results: No results found.   Assessment/Plan: Facial trauma from a fall Group A strepococcus bacteremia with Toxic shock syndrome, with facial cellulitis with neck cellulitis Wound eyelids Much improved Day 10 of antibiotic Continue linezolid until 07/01/22 -  Ey lid wounds had staph epi and hominis in culture- pathogen VS contaminant- being treate dappropriately with linezolid and improved significantly   Alcohol abuse   Discussed the management with patient- told her not to rub her eyes ID will sign off- call if needed

## 2022-06-27 NOTE — Progress Notes (Signed)
[facility] Progress Note  Patient: Anna Lucas    ELF:[id number]  DOA: 06/16/2022    Date of Service: the patient was seen and examined on 06/27/2022  Brief hospital course: 51 year old female with past medical history of PTSD, hypertension and chronic alcohol abuse with secondary thrombocytopenia who presented to the emergency room at [facility] on 6/23 after a fall and at that time had sustained a left occipital condyle fracture as well as right frontal scalp hematoma and right forehead scalp laceration.  Patient was seen and repaired by plastic surgery in the emergency room and discharged on Augmentin.  She presented to [facility] emergency room on 6/24 with worsening periorbital swelling and pain.  Lab work was unremarkable however CT of the chest/abdomen/pelvis noted probable pneumonia patient was admitted to the hospitalist service for sepsis from pneumonia and started on broad-spectrum antibiotics.  Patient was seen by ophthalmology for her facial hematoma recommended ice packs.  Blood cultures at that time came back positive for strep pyogenes, felt to be secondary to facial cellulitis.  Infectious disease consulted.  On the night of 6/25 patient became hypotensive and then confused as well as having fevers and then transferred to the ICU for Precedex drip.  Due to obstructive neck edema patient was intubated for airway protection.  Decompensation for to be secondary to alcohol withdrawal.  Patient able to be successfully extubated on 6/29 and transferred to hospitalist service starting 6/30.  Assessment and Plan: Assessment and Plan: * Sepsis due to Streptococcus pyogenes (HCC)-resolved as of 06/23/2022 Patient met criteria for severe sepsis on admission given leukocytosis, tachycardia, tachypnea with source being facial cellulitis cellulitis, and lactic acidosis.  Infectious disease following.  Sepsis is now stabilized.  Initially continued on penicillin and Zyvox stopped.  Tissue cultures grew out  penicillin resistant strep hominis and epidermis.  Zyvox restarted and penicillin stopped.  Complete 7 days of Zyvox on 7/4.  Toxic shock (HCC)-resolved as of 06/26/2022 Appreciate infectious disease help.  Antitoxin effect given from Zyvox.  Acute metabolic encephalopathy-resolved as of 06/23/2022 Multifactorial, initially from sepsis and then alcohol withdrawal.  Now still with some confusion although patient does answer some questions appropriately and this may be more delirium from sleep deprivation.  This looks to have fully resolved.  Ammonia level normal.  Alcohol abuse Status post alcohol withdrawals.  Was on Precedex drip.  As needed Ativan for now.  Based off of previous hospital records, had some hepatic steatosis and thrombocytopenia, but no evidence of cirrhosis.  On scheduled phenobarbital  Alcohol withdrawal delirium, persistent, hyperactive (HCC)-resolved as of 06/23/2022 Off of Precedex.  Extubated from ventilator, more for airway protection from edema from facial trauma  Facial trauma, subsequent encounter With forehead laceration, periorbital bruising and cellulitis, scalp hematoma.  Last ophthalmology to evaluate eyes his left eyelid appears to have some sloughing..  Improving, now no longer needs inpatient rehab.  For home health.  Anxiety and depression Continue home medications.  Steroids have been making the patient more anxious.  She still has 2 more days to go, so we will stop it a little early now.  Peripheral neuropathy Continue Neurontin  Obesity (BMI 30-39.9) Meets criteria BMI greater than 30  Yeast infection-resolved as of 06/26/2022 Patient complained of some burning when urinating and itching in her groin that has started over the last 24 hours.  She states she gets like this whenever she gets yeast infection that occurs pretty much every time she is on antibiotics.  Diflucan x1 on 7/2.  Still with a little symptoms.  We will give her 1 final dose  tomorrow.  Hypokalemia-resolved as of 06/26/2022 Replacing as needed  Hypomagnesemia-resolved as of 06/26/2022 Replacing as needed       Body mass index is 26.67 kg/m.  Nutrition Problem: Inadequate oral intake Etiology: inability to eat     Consultants: Critical Care Ophthalmology ID   Procedures: Ventilator support 6/26-6/29   Antimicrobials: Zyvox 6/27-6/30, 7/2-present IV PCN 6/27-7/2 Diflucan x1 on 7/2   Code Status: Full Code    Subjective: Still with some vaginal itching.  Objective: Vital signs were reviewed and unremarkable. Vitals:   06/26/22 2013 06/27/22 0543  BP: 117/80 (!) 140/93  Pulse: 79 75  Resp: 16 16  Temp: 98.6 F (37 C) 98.4 F (36.9 C)  SpO2: 98% 99%    Intake/Output Summary (Last 24 hours) at 06/27/2022 1534 Last data filed at 06/27/2022 1405 Gross per 24 hour  Intake 840 ml  Output --  Net 840 ml    Filed Weights   06/24/22 0337 06/26/22 0509 06/27/22 0500  Weight: 84.8 kg 70.3 kg 72.7 kg   Body mass index is 26.67 kg/m.  Exam:  General: Alert and oriented, no acute distress HEENT: Periorbital swelling significantly almost fully resolved on right, much improved on left, scalp laceratioon Cardiovascular: Regular rate and rhythm, S1-S2 Respiratory: Clear to auscultation bilaterally Abdomen: soft, non-tender, nondistended, positive bowel sounds  Musculoskeletal: No clubbing or cyanosis, or edema  Skin: bruising as above Psychiatry: Appropriate, no evidence of psychoses Neurology: none  Data Reviewed: No labs today  Disposition:  Status is: Inpatient Remains inpatient appropriate because: Patient is staying with her parents who will be back in town on Thursday, 7/6    Anticipated discharge date: 7/6    Family Communication: Left message for family DVT Prophylaxis: enoxaparin (LOVENOX) injection 40 mg Start: 06/21/22 2200 SCDs Start: 06/17/22 0455    Author: Annita Brod ,MD 06/27/2022 3:34 PM  To  reach On-call, see care teams to locate the attending and reach out via [email or web address]. Between 7PM-7AM, please contact night-coverage If you still have difficulty reaching the attending provider, please page the [facility] (Director on Call) for [facility] on amion for assistance.

## 2022-06-27 NOTE — Progress Notes (Signed)
Nutrition Follow-up  DOCUMENTATION CODES:   Not applicable  INTERVENTION:   -Continue liberalized diet of regular -Continue MVI with minerals daily -D/c Ensure Enlive po BID, each supplement provides 350 kcal and 20 grams of protein -Magic cup BID with meals, each supplement provides 290 kcal and 9 grams of protein   NUTRITION DIAGNOSIS:   Inadequate oral intake related to inability to eat as evidenced by NPO status.  Progressing; advanced to PO diet on 06/22/22  GOAL:   Patient will meet greater than or equal to 90% of their needs  Progressing  MONITOR:   PO intake, Supplement acceptance  REASON FOR ASSESSMENT:   Ventilator, Consult Enteral/tube feeding initiation and management  ASSESSMENT:   Pt with significant PMH of chronic EtOH abuse, chronic thrombocytopenia, hyponatremia, anxiety and depression, PTSD, and hypertension who presented with chief complaints of worsening periorbital swelling and pain status post fall on 6/23.  6/29- extubated 6/30- advanced to carb modified diet  Reviewed I/O's: +240 ml x 24 houra and +10.6 L since admission  Pt unavailable at time of visit. Attempted to speak with pt via call to hospital room phone, however, unable to reach.   Pt with improved oral intake. Noted meal completions 100%. Pt is minimally accepting of Ensure Enlive supplements.   Per CIR notes, pt no longer qualifies for inpatient rehab. Plan to discharge home with home health services.   Medications reviewed and include colace, folic acid, prednisone, and thiamine.   Labs reviewed: Na: 133, Phos: 4.8, CBGS: 87-193 (inpatient orders for glycemic control are none).    Diet Order:   Diet Order             Diet regular Room service appropriate? Yes; Fluid consistency: Thin  Diet effective now                   EDUCATION NEEDS:   Not appropriate for education at this time  Skin:  Skin Assessment: Skin Integrity Issues: Skin Integrity Issues:: Other  (Comment) Other: non-pressure wound to rt eye, rt upper forehead laceration  Last BM:  06/25/22  Height:   Ht Readings from Last 1 Encounters:  06/16/22 5\' 5"  (1.651 m)    Weight:   Wt Readings from Last 1 Encounters:  06/27/22 72.7 kg    Ideal Body Weight:  56.8 kg  BMI:  Body mass index is 26.67 kg/m.  Estimated Nutritional Needs:   Kcal:  1700-1900  Protein:  85-100 grams  Fluid:  > 1.7 L    08/28/22, RD, LDN, CDCES Registered Dietitian II Certified Diabetes Care and Education Specialist Please refer to [facility] for RD and/or RD on-call/weekend/after hours pager

## 2022-06-27 NOTE — Telephone Encounter (Signed)
Pharmacy Patient Advocate Encounter  Insurance verification completed.    The patient is not insured  The patient is currently admitted and ran test claims for the following: Linezolid 600 mg tablets.  Copays and coinsurance results were relayed to Inpatient clinical team.

## 2022-06-27 NOTE — Progress Notes (Signed)
Occupational Therapy Treatment Patient Details Name: Anna Lucas MRN: [id number] DOB: [birth year] Today's Date: 06/27/2022   History of present illness Anna Lucas is a 51 y/o F admitted on 06/16/22 after presenting to the ED with c/o worsening periorbital swelling & pain s/p fall on 06/15/22 (where pt found to have L occipital condyle fx). CT showed probable PNA & was started on antibiotics. On 06/17/22 pt was noted to be hypotensive & agitated with worsening mental status so tranferred to ICU. Pt was intubated 06/18/22 & extubated 06/21/22. PMH: EtOH abuse, chronic thrombocytopenia, hyponatremia, anxiety, depression, PTSD, HTN, hepatitis.   OT comments  Chart reviewed, RN cleared pt for participation in OT tx session. Pt unsure where soft collar was when OT entered room, located in laundry bin. Pt donned soft collar with MOD I, amb in room and hallway with supervision-CGA with fair safety awareness. Toileting completed with supervision. Fair safety awareness throughout requiring vcs for safe technique therefore pt would benefit from further OT to address deficits. Pt is left as received, NAD, all needs met. OT will follow acutely.    Recommendations for follow up therapy are one component of a multi-disciplinary discharge planning process, led by the attending physician.  Recommendations may be updated based on patient status, additional functional criteria and insurance authorization.    Follow Up Recommendations  Home health OT    Assistance Recommended at Discharge Intermittent Supervision/Assistance  Patient can return home with the following  A little help with bathing/dressing/bathroom;A little help with walking and/or transfers;Direct supervision/assist for medications management;Direct supervision/assist for financial management;Help with stairs or ramp for entrance   Equipment Recommendations  None recommended by OT    Recommendations for Other Services      Precautions / Restrictions  Precautions Precautions: Fall;Cervical Required Braces or Orthoses: Cervical Brace Cervical Brace: Soft collar;At all times Restrictions Weight Bearing Restrictions: No       Mobility Bed Mobility Overal bed mobility: Modified Independent                  Transfers Overall transfer level: Needs assistance   Transfers: Sit to/from Stand Sit to Stand: Modified independent (Device/Increase time)                 Balance Overall balance assessment: Needs assistance Sitting-balance support: Feet supported, Bilateral upper extremity supported Sitting balance-Leahy Scale: Good     Standing balance support: During functional activity, No upper extremity supported Standing balance-Leahy Scale: Good                             ADL either performed or assessed with clinical judgement   ADL Overall ADL's : Needs assistance/impaired     Grooming: Wash/dry hands;Standing;Supervision/safety Grooming Details (indicate cue type and reason): sink level                 Toilet Transfer: Supervision/safety;Ambulation   Toileting- Clothing Manipulation and Hygiene: Supervision/safety;Sit to/from stand       Functional mobility during ADLs: Supervision/safety;Min guard (approx 240')      Extremity/Trunk Assessment              Vision       Perception     Praxis      Cognition Arousal/Alertness: Awake/alert   Overall Cognitive Status: Impaired/Different from baseline                         Following  Commands: Follows one step commands consistently Safety/Judgement: Decreased awareness of deficits, Decreased awareness of safety Awareness: Emergent            Exercises      Shoulder Instructions       General Comments      Pertinent Vitals/ Pain       Pain Assessment Pain Assessment: Faces Faces Pain Scale: Hurts little more Pain Location: headache Pain Descriptors / Indicators: Aching Pain Intervention(s):  Limited activity within patient's tolerance, Monitored during session  Home Living                                          Prior Functioning/Environment              Frequency  Min 3X/week        Progress Toward Goals  OT Goals(current goals can now be found in the care plan section)  Progress towards OT goals: Progressing toward goals     Plan Discharge plan needs to be updated;Frequency needs to be updated    Co-evaluation                 AM-PAC OT "6 Clicks" Daily Activity     Outcome Measure   Help from another person eating meals?: None Help from another person taking care of personal grooming?: A Little Help from another person toileting, which includes using toliet, bedpan, or urinal?: A Little Help from another person bathing (including washing, rinsing, drying)?: A Little Help from another person to put on and taking off regular upper body clothing?: None Help from another person to put on and taking off regular lower body clothing?: A Little 6 Click Score: 20    End of Session Equipment Utilized During Treatment: Gait belt  OT Visit Diagnosis: Unsteadiness on feet (R26.81);Muscle weakness (generalized) (M62.81);Other symptoms and signs involving cognitive function   Activity Tolerance Patient tolerated treatment well   Patient Left in bed;with call bell/phone within reach;with bed alarm set   Nurse Communication Mobility status        Time: 6948-5462 OT Time Calculation (min): 9 min  Charges: OT General Charges $OT Visit: 1 Visit OT Treatments $Self Care/Home Management : 8-22 mins  Shanon Payor, OTD OTR/L  06/27/22, 3:18 PM

## 2022-06-28 ENCOUNTER — Other Ambulatory Visit: Payer: Self-pay

## 2022-06-28 ENCOUNTER — Other Ambulatory Visit (HOSPITAL_COMMUNITY): Payer: Self-pay

## 2022-06-28 DIAGNOSIS — F10931 Alcohol use, unspecified with withdrawal delirium: Secondary | ICD-10-CM | POA: Diagnosis not present

## 2022-06-28 DIAGNOSIS — G9341 Metabolic encephalopathy: Secondary | ICD-10-CM | POA: Diagnosis not present

## 2022-06-28 DIAGNOSIS — F1010 Alcohol abuse, uncomplicated: Secondary | ICD-10-CM | POA: Diagnosis not present

## 2022-06-28 DIAGNOSIS — A400 Sepsis due to streptococcus, group A: Secondary | ICD-10-CM | POA: Diagnosis not present

## 2022-06-28 MED ORDER — GABAPENTIN 300 MG PO CAPS: 300.0000 mg | ORAL_CAPSULE | Freq: Three times a day (TID) | ORAL | Status: DC

## 2022-06-28 MED ORDER — ERYTHROMYCIN 5 MG/GM OP OINT: TOPICAL_OINTMENT | Freq: Four times a day (QID) | OPHTHALMIC | 0 refills | Status: DC

## 2022-06-28 MED ORDER — OXYCODONE HCL 5 MG PO TABS: 5.0000 mg | ORAL_TABLET | ORAL | 0 refills | Status: AC | PRN

## 2022-06-28 MED ORDER — LINEZOLID 600 MG PO TABS
600.0000 mg | ORAL_TABLET | Freq: Two times a day (BID) | ORAL | 0 refills | Status: DC
  Filled 2022-06-28: qty 5, 3d supply, fill #0

## 2022-06-28 MED ORDER — THIAMINE HCL 100 MG PO TABS: 100.0000 mg | ORAL_TABLET | Freq: Every day | ORAL | 2 refills | Status: DC

## 2022-06-28 MED ORDER — LINEZOLID 600 MG PO TABS: 600.0000 mg | ORAL_TABLET | Freq: Two times a day (BID) | ORAL | 0 refills | Status: DC

## 2022-06-28 MED ORDER — HYDROXYZINE HCL 50 MG PO TABS: 50.0000 mg | ORAL_TABLET | Freq: Three times a day (TID) | ORAL | 0 refills | Status: DC | PRN

## 2022-06-28 MED ORDER — AMLODIPINE BESYLATE 5 MG PO TABS: 5.0000 mg | ORAL_TABLET | Freq: Every day | ORAL | 2 refills | Status: DC

## 2022-06-28 MED ORDER — TRAZODONE HCL 50 MG PO TABS: 50.0000 mg | ORAL_TABLET | Freq: Every day | ORAL | 1 refills | Status: DC

## 2022-06-28 MED ORDER — FOLIC ACID 1 MG PO TABS: 1.0000 mg | ORAL_TABLET | Freq: Every day | ORAL | 2 refills | Status: DC

## 2022-06-28 MED ORDER — FLUCONAZOLE 150 MG PO TABS: 150.0000 mg | ORAL_TABLET | Freq: Once | ORAL | 0 refills | Status: AC

## 2022-06-28 NOTE — Discharge Summary (Signed)
Physician Discharge Summary   Patient: Anna Lucas MRN: [id number] DOB: 1971/01/14  Admit date:     06/16/2022  Discharge date: 06/28/22  Discharge Physician: Annita Brod   PCP: [place]   Recommendations at discharge:   Patient being discharged with home health PT New medication: Diflucan 150 p.o. x1 after completion of antibiotics New medication: Zyvox 600 mg p.o. every 12 hours x5 doses, next dose on night of 7/6 Medication change: Trazodone decreased to 50 mg p.o. nightly New medication: Oxy IR 5 mg every 4 hours as needed for pain.  15 pills given. New medication: Folate 1 mg p.o. daily New medication: Thiamine 100 mg p.o. daily Medication change: Neurontin decreased to 300 mg p.o. daily New medication: Erythromycin ophthalmic ointment placed into both eyes 4 times a day.  Discharge Diagnoses: Active Problems:   Alcohol abuse   Facial trauma, subsequent encounter   Anxiety and depression   Peripheral neuropathy   Obesity (BMI 30-39.9)   CAP (community acquired pneumonia)  Principal Problem (Resolved):   Sepsis due to Streptococcus pyogenes ([place]) Resolved Problems:   Toxic shock ([place])   Acute metabolic encephalopathy   Alcohol withdrawal delirium, persistent, hyperactive ([place])   Hypomagnesemia   Hypokalemia   Yeast infection  Hospital Course: 51 year old female with past medical history of PTSD, hypertension and chronic alcohol abuse with secondary thrombocytopenia who presented to the emergency room at [facility] on 6/23 after a fall and at that time had sustained a left occipital condyle fracture as well as right frontal scalp hematoma and right forehead scalp laceration.  Patient was seen and repaired by plastic surgery in the emergency room and discharged on Augmentin.  She presented to [facility] emergency room on 6/24 with worsening periorbital swelling and pain.  Lab work was unremarkable however CT of the chest/abdomen/pelvis noted probable  pneumonia patient was admitted to the hospitalist service for sepsis from pneumonia and started on broad-spectrum antibiotics.  Patient was seen by ophthalmology for her facial hematoma recommended ice packs.  Blood cultures at that time came back positive for strep pyogenes, felt to be secondary to facial cellulitis.  Infectious disease consulted.  On the night of 6/25 patient became hypotensive and then confused as well as having fevers and then transferred to the ICU for Precedex drip.  Due to obstructive neck edema patient was intubated for airway protection.  Decompensation for to be secondary to alcohol withdrawal.  Patient able to be successfully extubated on 6/29 and transferred to hospitalist service starting 6/30.  Assessment and Plan: * Sepsis due to Streptococcus pyogenes (HCC)-resolved as of 06/23/2022 Patient met criteria for severe sepsis on admission given leukocytosis, tachycardia, tachypnea with source being facial cellulitis cellulitis, and lactic acidosis.  Infectious disease following.  Sepsis is now stabilized.  Initially continued on penicillin and Zyvox stopped.  Tissue cultures grew out penicillin resistant strep hominis and epidermis.  Zyvox restarted and penicillin stopped.  Complete 7 days of Zyvox on 7/8.  Toxic shock (HCC)-resolved as of 06/26/2022 Appreciate infectious disease help.  Antitoxin effect given from Zyvox.  Acute metabolic encephalopathy-resolved as of 06/23/2022 Multifactorial, initially from sepsis and then alcohol withdrawal.  Now still with some confusion although patient does answer some questions appropriately and this may be more delirium from sleep deprivation.  This looks to have fully resolved.  Ammonia level normal.  Alcohol abuse Status post alcohol withdrawals.  Was on Precedex drip.  As needed Ativan for now.  Based off of previous hospital  records, had some hepatic steatosis and thrombocytopenia, but no evidence of cirrhosis.  On scheduled  phenobarbital  Alcohol withdrawal delirium, persistent, hyperactive (HCC)-resolved as of 06/23/2022 Off of Precedex.  Extubated from ventilator, more for airway protection from edema from facial trauma  Facial trauma, subsequent encounter With forehead laceration, periorbital bruising and cellulitis, scalp hematoma.  Last ophthalmology to evaluate eyes his left eyelid appears to have some sloughing..  Improving, now no longer needs inpatient rehab.  For home health.  Anxiety and depression Continue home medications.  Steroids have been making the patient more anxious.  She still has 2 more days to go, so we will stop it a little early now.  Peripheral neuropathy Continue Neurontin  Obesity (BMI 30-39.9) Meets criteria BMI greater than 30  Yeast infection-resolved as of 06/26/2022 Patient complained of some burning when urinating and itching in her groin that has started over the last 24 hours.  She states she gets like this whenever she gets yeast infection that occurs pretty much every time she is on antibiotics.  Diflucan x1 on 7/2.  Still with a little symptoms.  We will give her 1 final dose tomorrow.  Hypokalemia-resolved as of 06/26/2022 Replacing as needed  Hypomagnesemia-resolved as of 06/26/2022 Replacing as needed        Pain control - [facility] Controlled Substance Reporting System database was reviewed. and patient was instructed, not to drive, operate heavy machinery, perform activities at heights, swimming or participation in water activities or provide baby-sitting services while on Pain, Sleep and Anxiety Medications; until their outpatient Physician has advised to do so again. Also recommended to not to take more than prescribed Pain, Sleep and Anxiety Medications.  Consultants: Critical Care Ophthalmology ID    Procedures: Ventilator support 6/26-6/29   Disposition: Relative's home Diet recommendation:  Regular diet DISCHARGE MEDICATION: Allergies as of  06/28/2022       Reactions   Prednisone    Feet swelling        Medication List     STOP taking these medications    amoxicillin-clavulanate 875-125 MG tablet Commonly known as: AUGMENTIN       TAKE these medications    albuterol 108 (90 Base) MCG/ACT inhaler Commonly known as: VENTOLIN HFA Inhale 2 puffs into the lungs every 6 (six) hours as needed for wheezing or shortness of breath.   amLODipine 5 MG tablet Commonly known as: NORVASC Take 1 tablet (5 mg total) by mouth daily.   citalopram 40 MG tablet Commonly known as: CELEXA Take 40 mg by mouth daily.   erythromycin ophthalmic ointment Place into both eyes 4 (four) times daily.   fluconazole 150 MG tablet Commonly known as: Diflucan Take 1 tablet (150 mg total) by mouth once for 1 dose.   folic acid 1 MG tablet Commonly known as: FOLVITE Take 1 tablet (1 mg total) by mouth daily. Start taking on: June 29, 2022   gabapentin 300 MG capsule Commonly known as: NEURONTIN Take 1 capsule (300 mg total) by mouth 3 (three) times daily.   hydrOXYzine 50 MG tablet Commonly known as: ATARAX Take 50 mg by mouth 3 (three) times daily as needed.   linezolid 600 MG tablet Commonly known as: ZYVOX Take 1 tablet (600 mg total) by mouth 2 (two) times daily.   multivitamin with minerals Tabs tablet Take 1 tablet by mouth daily.   omeprazole 40 MG capsule Commonly known as: PRILOSEC Take 40 mg by mouth daily.   oxyCODONE 5 MG immediate  release tablet Commonly known as: Oxy IR/ROXICODONE Take 1 tablet (5 mg total) by mouth every 4 (four) hours as needed for up to 5 days. For up to 5 days. What changed: reasons to take this   thiamine 100 MG tablet Take 1 tablet (100 mg total) by mouth daily. Start taking on: June 29, 2022   traZODone 50 MG tablet Commonly known as: DESYREL Take 1 tablet (50 mg total) by mouth at bedtime. What changed:  medication strength how much to take        Discharge Exam: Filed  Weights   06/24/22 0337 06/26/22 0509 06/27/22 0500  Weight: 84.8 kg 70.3 kg 72.7 kg   General: Alert and oriented x3, no acute distress Cardiovascular: Regular rate and rhythm, S1-S2 Lungs: Clear to auscultation bilaterally  Condition at discharge: good  The results of significant diagnostics from this hospitalization (including imaging, microbiology, ancillary and laboratory) are listed below for reference.   Imaging Studies: DG Chest Port 1 View  Result Date: 06/20/2022 CLINICAL DATA:  Sepsis EXAM: PORTABLE CHEST 1 VIEW COMPARISON:  Chest x-ray dated June 18, 2022 FINDINGS: ETT tip is near the carina, unchanged when compared to prior exam. Enteric tube partially seen coursing below the diaphragm. Cardiac and mediastinal contours are unchanged. Increased bibasilar opacities. Possible small left pleural effusion. No evidence of pneumothorax. IMPRESSION: 1. Increased bibasilar opacities, likely due to atelectasis. Infection or aspiration could appear similar. 2. Possible small left pleural effusion. 3. ETT tip is near the carina, consider retraction for optimal positioning. Electronically Signed   By: Yetta Glassman M.D.   On: 06/20/2022 08:00   CT TEMPORAL BONES WO CONTRAST  Result Date: 06/19/2022 CLINICAL DATA:  Initial evaluation for otitis media. History of fall. EXAM: CT TEMPORAL BONES WITHOUT CONTRAST TECHNIQUE: Axial and coronal plane CT imaging of the petrous temporal bones was performed with thin-collimation image reconstruction. No intravenous contrast was administered. Multiplanar CT image reconstructions were also generated. RADIATION DOSE REDUCTION: This exam was performed according to the departmental dose-optimization program which includes automated exposure control, adjustment of the mA and/or kV according to patient size and/or use of iterative reconstruction technique. COMPARISON:  None available. FINDINGS: RIGHT TEMPORAL BONE External auditory canal: Right EAC is clear.  Tympanic membrane is thin and intact. Middle ear cavity: Middle ear cavity is clear. Tegmen tympani intact. Ossicular chain intact and normally formed. Inner ear structures: Vestibule, semi circular canals, and cochlea within normal limits. Internal auditory and facial nerve canals: Internal auditory canal within normal limits. Facial nerve canal intact and bony covered to the stylomastoid foramen. Mastoid air cells: Clear. LEFT TEMPORAL BONE External auditory canal: Left EAC is clear. Tympanic membrane thin and grossly intact. Middle ear cavity: Minimal soft tissue density within Prussak's space. No erosion of the adjacent scutum. Middle ear cavity otherwise clear. Ossicular chain intact and normally formed. Tegmen tympani intact. Inner ear structures: Vestibule, semi circular canals, and cochlea within normal limits. Internal auditory and facial nerve canals: Internal auditory canal within normal limits. Facial nerve canal intact and bony covered to the stylomastoid foramen. Mastoid air cells: Trace scattered left mastoid effusion. No erosion or coalescence. Sigmoid plate intact. Vascular: No visible abnormality about the sigmoid sinuses or jugular bulbs. Carotid canals intact without abnormality. Limited intracranial:  Advanced atrophy for age. Visible orbits/paranasal sinuses: Globes and orbital soft tissues within normal limits. Mild scattered mucosal thickening noted within the sphenoethmoidal and maxillary sinuses. Fluid seen within the nasopharynx. Patient is intubated. Osteoarthritic changes noted about  the left greater than right TMJs. Soft tissues: Diffuse soft tissue swelling seen throughout the soft tissues of the scalp. Probable superimposed evolving subgaleal hematoma noted about the visualized temporal occipital scalp. Extensive soft tissue swelling extends to involve the periorbital soft tissues and visualized face/neck as well. Few scattered foci of soft tissue emphysema at the right forehead,  which could be related to overlying laceration. No visible radiopaque foreign body. No visible collections. IMPRESSION: 1. Trace scattered left mastoid and middle ear effusion, of uncertain significance, and could be related to intubation. Possible early changes of otomastoiditis could be considered in the correct clinical setting. No coalescence or other complicating features. 2. Normal CT of the right temporal bone. 3. Diffuse soft tissue swelling throughout the soft tissues of the scalp, with extension to involve the periorbital soft tissues and visualized face/neck. Electronically Signed   By: Jeannine Boga M.D.   On: 06/19/2022 04:01   CT SOFT TISSUE NECK WO CONTRAST  Result Date: 06/19/2022 CLINICAL DATA:  Initial evaluation for foreign body suspected. Swelling, fall. EXAM: CT NECK WITHOUT CONTRAST TECHNIQUE: Multidetector CT imaging of the neck was performed following the standard protocol without intravenous contrast. RADIATION DOSE REDUCTION: This exam was performed according to the departmental dose-optimization program which includes automated exposure control, adjustment of the mA and/or kV according to patient size and/or use of iterative reconstruction technique. COMPARISON:  None Available. FINDINGS: Pharynx and larynx: Endotracheal and enteric tubes in place, limiting assessment. Oral cavity within normal limits. No convincing acute inflammatory changes about the dentition. Oropharynx and nasopharynx grossly within normal limits. Trace retropharyngeal effusion, felt to be secondary to the diffuse soft tissue swelling within the face/neck. No loculated collection. Epiglottis and vallecula are effaced by the endotracheal tube, not well assessed. Remainder of the hypopharynx and supraglottic larynx grossly within normal limits. Glottis grossly symmetric and within normal limits. Visualized subglottic airway patent clear. Tip of the endotracheal to is position near the level of the carina.  Retraction by approximately 1.5 cm suggested. No radiopaque foreign body. Salivary glands: Diffuse soft tissue stranding about the face partially surrounds the parotid and submandibular glands. The glands themselves are fairly symmetric and felt to be within normal limits on this noncontrast examination. 6 mm sialolith noted within the distal aspect of the left submandibular duct. No visible ductal dilatation or convincing evidence for acute sialoadenitis. Thyroid: Thyroid within normal limits. Lymph nodes: Shotty subcentimeter lymph nodes noted extending along the cervical chains bilaterally. No enlarged or overt pathologic adenopathy identified. Vascular: Mild atheromatous change about the aortic arch and carotid bifurcations. Evaluation for vascular patency limited by lack of IV contrast. Limited intracranial: Unremarkable. Visualized orbits: Prominent periorbital soft tissue swelling, partially visualized. Visualized globes M cells within normal limits. Visualized postseptal soft tissues unremarkable. Mastoids and visualized paranasal sinuses: Scattered mucosal thickening noted about the visualized ethmoidal air cells, sphenoid sinuses, and maxillary sinuses. Visualized mastoid air cells and middle ear cavities are clear. Skeleton: No discrete or worrisome osseous lesions. Moderate degenerative spondylosis present at C6-7. Degenerative changes noted about the left TMJ. Upper chest: Scattered atelectatic changes noted within the visualized lungs. Visualized upper chest demonstrates no other acute finding. Other: Diffuse soft tissue swelling and stranding seen throughout the soft tissues of the visualized head, face, and neck. Few scattered foci of soft tissue emphysema present at the right forehead, which could be related to laceration. Inferior extension to involve the upper anterior chest wall. Changes are most pronounced within the subcutaneous fat, and are  fairly symmetric in nature. Findings are of uncertain  etiology, and could be posttraumatic in nature or either infectious or inflammatory. No loculated collections. IMPRESSION: 1. Extensive soft tissue swelling and stranding throughout the soft tissues of the visualized head, face, and neck. Findings are of uncertain etiology, and could be related to recent trauma given provided history. A diffuse infectious or inflammatory/allergic process could also be considered. No loculated collections. 2. Few small foci of soft tissue emphysema at the right forehead, which could be related to laceration. Correlation with physical exam recommended. 3. 6 mm stone within the left submandibular duct. No visible ductal dilatation or evidence for acute sialoadenitis. 4. Tip of the endotracheal to positioned near the level of the carina. Retraction by approximately 1.5 cm suggested. Electronically Signed   By: Jeannine Boga M.D.   On: 06/19/2022 03:43   CT HEAD WO CONTRAST (5MM)  Result Date: 06/18/2022 CLINICAL DATA:  Head trauma, GCS<=14 (Ped 0-17y); Head trauma, abnormal mental status (Age 2-64y) EXAM: CT HEAD WITHOUT CONTRAST CT MAXILLOFACIAL WITHOUT CONTRAST TECHNIQUE: Multidetector CT imaging of the head and maxillofacial structures were performed using the standard protocol without intravenous contrast. Multiplanar CT image reconstructions of the maxillofacial structures were also generated. RADIATION DOSE REDUCTION: This exam was performed according to the departmental dose-optimization program which includes automated exposure control, adjustment of the mA and/or kV according to patient size and/or use of iterative reconstruction technique. COMPARISON:  None Available. FINDINGS: CT HEAD FINDINGS Brain: Normal anatomic configuration. No abnormal intra or extra-axial mass lesion or fluid collection. No abnormal mass effect or midline shift. No evidence of acute intracranial hemorrhage or infarct. Ventricular size is normal. Cerebellum unremarkable. Vascular:  Unremarkable Skull: Intact Other: Mastoid air cells and middle ear cavities are clear. Punctate foci of gas are again seen within the scalp anterior to the frontal bone. Large scalp hematoma has migrated dependently to the temporoparietal scalp bilaterally and is again seen extending into the soft tissues of the face and neck suggesting a subgaleal hematoma CT MAXILLOFACIAL FINDINGS Osseous: No fracture or mandibular dislocation. No destructive process. Orbits: Negative. No traumatic or inflammatory finding. Sinuses: There is small layering fluid within the right sphenoid sinus and opacification of several ethmoid air cells bilaterally, nonspecific in the setting of intubation. The paranasal sinuses are otherwise unremarkable. Soft tissues: There is extensive soft tissue swelling involving the facial soft tissues diffusely with moderate bilateral preseptal soft tissue swelling noted. The subcutaneous edema of the facial soft tissues extends into the superior neck. IMPRESSION: 1. No acute intracranial abnormality. No calvarial fracture. 2. No acute facial fracture. 3. Extensive soft tissue swelling involving the facial soft tissues diffusely with extension into the superior neck. Large scalp hematoma has migrated dependently to the temporoparietal scalp bilaterally and is again seen extending into the soft tissues of the face and neck suggesting a subgaleal hematoma. 4. Small layering fluid within the right sphenoid sinus and opacification of several ethmoid air cells bilaterally, nonspecific in the setting of intubation. Electronically Signed   By: Fidela Salisbury M.D.   On: 06/18/2022 23:09   CT MAXILLOFACIAL WO CONTRAST  Result Date: 06/18/2022 CLINICAL DATA:  Head trauma, GCS<=14 (Ped 0-17y); Head trauma, abnormal mental status (Age 34-64y) EXAM: CT HEAD WITHOUT CONTRAST CT MAXILLOFACIAL WITHOUT CONTRAST TECHNIQUE: Multidetector CT imaging of the head and maxillofacial structures were performed using the  standard protocol without intravenous contrast. Multiplanar CT image reconstructions of the maxillofacial structures were also generated. RADIATION DOSE REDUCTION: This exam was performed  according to the departmental dose-optimization program which includes automated exposure control, adjustment of the mA and/or kV according to patient size and/or use of iterative reconstruction technique. COMPARISON:  None Available. FINDINGS: CT HEAD FINDINGS Brain: Normal anatomic configuration. No abnormal intra or extra-axial mass lesion or fluid collection. No abnormal mass effect or midline shift. No evidence of acute intracranial hemorrhage or infarct. Ventricular size is normal. Cerebellum unremarkable. Vascular: Unremarkable Skull: Intact Other: Mastoid air cells and middle ear cavities are clear. Punctate foci of gas are again seen within the scalp anterior to the frontal bone. Large scalp hematoma has migrated dependently to the temporoparietal scalp bilaterally and is again seen extending into the soft tissues of the face and neck suggesting a subgaleal hematoma CT MAXILLOFACIAL FINDINGS Osseous: No fracture or mandibular dislocation. No destructive process. Orbits: Negative. No traumatic or inflammatory finding. Sinuses: There is small layering fluid within the right sphenoid sinus and opacification of several ethmoid air cells bilaterally, nonspecific in the setting of intubation. The paranasal sinuses are otherwise unremarkable. Soft tissues: There is extensive soft tissue swelling involving the facial soft tissues diffusely with moderate bilateral preseptal soft tissue swelling noted. The subcutaneous edema of the facial soft tissues extends into the superior neck. IMPRESSION: 1. No acute intracranial abnormality. No calvarial fracture. 2. No acute facial fracture. 3. Extensive soft tissue swelling involving the facial soft tissues diffusely with extension into the superior neck. Large scalp hematoma has migrated  dependently to the temporoparietal scalp bilaterally and is again seen extending into the soft tissues of the face and neck suggesting a subgaleal hematoma. 4. Small layering fluid within the right sphenoid sinus and opacification of several ethmoid air cells bilaterally, nonspecific in the setting of intubation. Electronically Signed   By: Fidela Salisbury M.D.   On: 06/18/2022 23:09   DG Abd 1 View  Result Date: 06/18/2022 CLINICAL DATA:  Enteric tube placement. EXAM: ABDOMEN - 1 VIEW COMPARISON:  CT abdomen pelvis from yesterday. FINDINGS: Enteric tube tip in the distal stomach near the pylorus. The bowel gas pattern is normal. No radio-opaque calculi or other significant radiographic abnormality are seen. IMPRESSION: 1. Enteric tube tip in the distal stomach near the pylorus. Electronically Signed   By: Titus Dubin M.D.   On: 06/18/2022 16:55   DG Chest 1 View  Result Date: 06/18/2022 CLINICAL DATA:  ETT placement EXAM: CHEST  1 VIEW COMPARISON:  Chest x-ray dated February 02, 2016 FINDINGS: ETT tip is near the carina, approximately 0.5 cm. OG tube partially seen coursing below the diaphragm. Cardiac and mediastinal contours are within normal limits. Mild right-greater-than-left heterogeneous opacities, likely due to atelectasis. No focal consolidation. No large pleural effusion or pneumothorax. IMPRESSION: ETT tip is near the carina, recommend retraction for optimal positioning. Electronically Signed   By: Yetta Glassman M.D.   On: 06/18/2022 16:53   CT L-SPINE NO CHARGE  Result Date: 06/17/2022 CLINICAL DATA:  Fall down stairs, back injury EXAM: CT LUMBAR SPINE WITHOUT CONTRAST TECHNIQUE: Multidetector CT imaging of the lumbar spine was performed without intravenous contrast administration. Multiplanar CT image reconstructions were also generated. RADIATION DOSE REDUCTION: This exam was performed according to the departmental dose-optimization program which includes automated exposure control,  adjustment of the mA and/or kV according to patient size and/or use of iterative reconstruction technique. COMPARISON:  None Available. FINDINGS: Segmentation: 5 lumbar type vertebrae. Alignment: Normal. Vertebrae: No acute fracture or focal pathologic process. Paraspinal and other soft tissues: Negative. Disc levels: No significant neuroforaminal  narrowing or canal stenosis noted at T12-L3. Broad-based disc bulge at L3-4 in combination with mild hypertrophy of the lamina propria results in mild central canal stenosis. No neuroforaminal narrowing. Mild left facet arthrosis. Broad-based disc bulge at L4-5 in combination with hypertrophy of the lamina propria results in mild central canal stenosis. No significant neuroforaminal narrowing. Moderate bilateral facet arthrosis. Mild broad-based disc bulge at L5-S1 without significant canal stenosis. No neuroforaminal narrowing. Mild bilateral facet arthrosis. IMPRESSION: 1. No acute fracture or listhesis. 2. Mild broad-based disc bulges at L3-4 and L4-5 in combination with hypertrophy of the lamina propria results in mild central canal stenosis. Electronically Signed   By: Fidela Salisbury M.D.   On: 06/17/2022 00:19   CT CHEST ABDOMEN PELVIS W CONTRAST  Result Date: 06/17/2022 CLINICAL DATA:  Polytrauma, blunt.  Fall downstairs EXAM: CT CHEST, ABDOMEN, AND PELVIS WITH CONTRAST TECHNIQUE: Multidetector CT imaging of the chest, abdomen and pelvis was performed following the standard protocol during bolus administration of intravenous contrast. RADIATION DOSE REDUCTION: This exam was performed according to the departmental dose-optimization program which includes automated exposure control, adjustment of the mA and/or kV according to patient size and/or use of iterative reconstruction technique. CONTRAST:  72m OMNIPAQUE IOHEXOL 350 MG/ML SOLN COMPARISON:  CT abdomen pelvis 05/11/2022 FINDINGS: CT CHEST FINDINGS Cardiovascular: Extensive multi-vessel coronary artery  calcification. Global cardiac size within normal limits. No pericardial effusion. Central pulmonary arteries are of normal caliber. Thoracic aorta is unremarkable. Mediastinum/Nodes: No enlarged mediastinal, hilar, or axillary lymph nodes. Thyroid gland, trachea, and esophagus demonstrate no significant findings. Lungs/Pleura: Minimal focal ground-glass pulmonary infiltrate within the left lower lobe is nonspecific, possibly infectious or inflammatory in nature. The lungs are otherwise clear. No pneumothorax or pleural effusion. Central airways are widely patent. Musculoskeletal: No chest wall mass or suspicious bone lesions identified. CT ABDOMEN PELVIS FINDINGS Hepatobiliary: No focal liver abnormality is seen. No gallstones, gallbladder wall thickening, or biliary dilatation. Pancreas: Unremarkable Spleen: Unremarkable Adrenals/Urinary Tract: Adrenal glands are unremarkable. Kidneys are normal, without renal calculi, focal lesion, or hydronephrosis. Bladder is unremarkable. Stomach/Bowel: Stomach is within normal limits. Appendix appears normal. No evidence of bowel wall thickening, distention, or inflammatory changes. Vascular/Lymphatic: Aortic atherosclerosis. No enlarged abdominal or pelvic lymph nodes. Reproductive: Uterus and bilateral adnexa are unremarkable. Other: No abdominal wall hernia or abnormality. No abdominopelvic ascites. Musculoskeletal: No acute bone abnormality. No lytic or blastic bone lesion. IMPRESSION: 1. No acute intrathoracic or intra-abdominal pathology identified. 2. Extensive multi-vessel coronary artery calcification. 3. Minimal focal ground-glass pulmonary infiltrate within the left lower lobe, nonspecific, possibly infectious or inflammatory in nature. Aortic Atherosclerosis (ICD10-I70.0). Electronically Signed   By: AFidela SalisburyM.D.   On: 06/17/2022 00:12   CT Head Wo Contrast  Result Date: 06/16/2022 CLINICAL DATA:  Fall down stairs EXAM: CT HEAD WITHOUT CONTRAST CT  MAXILLOFACIAL WITHOUT CONTRAST CT CERVICAL SPINE WITHOUT CONTRAST TECHNIQUE: Multidetector CT imaging of the head, cervical spine, and maxillofacial structures were performed using the standard protocol without intravenous contrast. Multiplanar CT image reconstructions of the cervical spine and maxillofacial structures were also generated. RADIATION DOSE REDUCTION: This exam was performed according to the departmental dose-optimization program which includes automated exposure control, adjustment of the mA and/or kV according to patient size and/or use of iterative reconstruction technique. COMPARISON:  None Available. FINDINGS: CT HEAD FINDINGS Brain: There is no mass, hemorrhage or extra-axial collection. The size and configuration of the ventricles and extra-axial CSF spaces are normal. The brain parenchyma is normal, without evidence of  acute or chronic infarction. Vascular: No abnormal hyperdensity of the major intracranial arteries or dural venous sinuses. No intracranial atherosclerosis. Skull: Large anterior scalp hematoma without skull fracture CT MAXILLOFACIAL FINDINGS Osseous: No acute fracture Orbits: The globes are intact. Normal appearance of the intra- and extraconal fat. Symmetric extraocular muscles and optic nerves. Chronic left orbital floor defect. Periorbital soft tissue swelling. Sinuses: No fluid levels or advanced mucosal thickening. Soft tissues: Normal visualized extracranial soft tissues. CT CERVICAL SPINE FINDINGS Alignment: No static subluxation. Facets are aligned. Occipital condyles and the lateral masses of C1-C2 are aligned. Skull base and vertebrae: No acute fracture. Soft tissues and spinal canal: No prevertebral fluid or swelling. No visible canal hematoma. Disc levels: No advanced spinal canal or neural foraminal stenosis. Upper chest: No pneumothorax, pulmonary nodule or pleural effusion. Other: Normal visualized paraspinal cervical soft tissues. IMPRESSION: 1. No acute  intracranial abnormality. 2. Large anterior scalp and facial hematoma without skull fracture. 3. No acute fracture or static subluxation of the cervical spine. 4. No acute fracture or static subluxation of the facial bones. Electronically Signed   By: Ulyses Jarred M.D.   On: 06/16/2022 22:38   CT Maxillofacial Wo Contrast  Result Date: 06/16/2022 CLINICAL DATA:  Fall down stairs EXAM: CT HEAD WITHOUT CONTRAST CT MAXILLOFACIAL WITHOUT CONTRAST CT CERVICAL SPINE WITHOUT CONTRAST TECHNIQUE: Multidetector CT imaging of the head, cervical spine, and maxillofacial structures were performed using the standard protocol without intravenous contrast. Multiplanar CT image reconstructions of the cervical spine and maxillofacial structures were also generated. RADIATION DOSE REDUCTION: This exam was performed according to the departmental dose-optimization program which includes automated exposure control, adjustment of the mA and/or kV according to patient size and/or use of iterative reconstruction technique. COMPARISON:  None Available. FINDINGS: CT HEAD FINDINGS Brain: There is no mass, hemorrhage or extra-axial collection. The size and configuration of the ventricles and extra-axial CSF spaces are normal. The brain parenchyma is normal, without evidence of acute or chronic infarction. Vascular: No abnormal hyperdensity of the major intracranial arteries or dural venous sinuses. No intracranial atherosclerosis. Skull: Large anterior scalp hematoma without skull fracture CT MAXILLOFACIAL FINDINGS Osseous: No acute fracture Orbits: The globes are intact. Normal appearance of the intra- and extraconal fat. Symmetric extraocular muscles and optic nerves. Chronic left orbital floor defect. Periorbital soft tissue swelling. Sinuses: No fluid levels or advanced mucosal thickening. Soft tissues: Normal visualized extracranial soft tissues. CT CERVICAL SPINE FINDINGS Alignment: No static subluxation. Facets are aligned.  Occipital condyles and the lateral masses of C1-C2 are aligned. Skull base and vertebrae: No acute fracture. Soft tissues and spinal canal: No prevertebral fluid or swelling. No visible canal hematoma. Disc levels: No advanced spinal canal or neural foraminal stenosis. Upper chest: No pneumothorax, pulmonary nodule or pleural effusion. Other: Normal visualized paraspinal cervical soft tissues. IMPRESSION: 1. No acute intracranial abnormality. 2. Large anterior scalp and facial hematoma without skull fracture. 3. No acute fracture or static subluxation of the cervical spine. 4. No acute fracture or static subluxation of the facial bones. Electronically Signed   By: Ulyses Jarred M.D.   On: 06/16/2022 22:38   CT Cervical Spine Wo Contrast  Result Date: 06/16/2022 CLINICAL DATA:  Fall down stairs EXAM: CT HEAD WITHOUT CONTRAST CT MAXILLOFACIAL WITHOUT CONTRAST CT CERVICAL SPINE WITHOUT CONTRAST TECHNIQUE: Multidetector CT imaging of the head, cervical spine, and maxillofacial structures were performed using the standard protocol without intravenous contrast. Multiplanar CT image reconstructions of the cervical spine and maxillofacial structures were also  generated. RADIATION DOSE REDUCTION: This exam was performed according to the departmental dose-optimization program which includes automated exposure control, adjustment of the mA and/or kV according to patient size and/or use of iterative reconstruction technique. COMPARISON:  None Available. FINDINGS: CT HEAD FINDINGS Brain: There is no mass, hemorrhage or extra-axial collection. The size and configuration of the ventricles and extra-axial CSF spaces are normal. The brain parenchyma is normal, without evidence of acute or chronic infarction. Vascular: No abnormal hyperdensity of the major intracranial arteries or dural venous sinuses. No intracranial atherosclerosis. Skull: Large anterior scalp hematoma without skull fracture CT MAXILLOFACIAL FINDINGS Osseous:  No acute fracture Orbits: The globes are intact. Normal appearance of the intra- and extraconal fat. Symmetric extraocular muscles and optic nerves. Chronic left orbital floor defect. Periorbital soft tissue swelling. Sinuses: No fluid levels or advanced mucosal thickening. Soft tissues: Normal visualized extracranial soft tissues. CT CERVICAL SPINE FINDINGS Alignment: No static subluxation. Facets are aligned. Occipital condyles and the lateral masses of C1-C2 are aligned. Skull base and vertebrae: No acute fracture. Soft tissues and spinal canal: No prevertebral fluid or swelling. No visible canal hematoma. Disc levels: No advanced spinal canal or neural foraminal stenosis. Upper chest: No pneumothorax, pulmonary nodule or pleural effusion. Other: Normal visualized paraspinal cervical soft tissues. IMPRESSION: 1. No acute intracranial abnormality. 2. Large anterior scalp and facial hematoma without skull fracture. 3. No acute fracture or static subluxation of the cervical spine. 4. No acute fracture or static subluxation of the facial bones. Electronically Signed   By: Ulyses Jarred M.D.   On: 06/16/2022 22:38    Microbiology: Results for orders placed or performed during the hospital encounter of 06/16/22  Blood culture (routine x 2)     Status: Abnormal   Collection Time: 06/17/22  1:57 AM   Specimen: BLOOD  Result Value Ref Range Status   Specimen Description   Final    BLOOD BLOOD LEFT FOREARM Performed at [facility], [address] [place], [place] [address]    Special Requests   Final    BOTTLES DRAWN AEROBIC AND ANAEROBIC Blood Culture adequate volume Performed at [facility], [address], [place], [place] [address]    Culture  Setup Time   Final    GRAM POSITIVE COCCI IN BOTH AEROBIC AND ANAEROBIC BOTTLES CRITICAL RESULT CALLED TO, READ BACK BY AND VERIFIED WITH: [place] ON 06/17/22 _0  QSD Performed at [facility], [address], [place], [place] [address]    Culture (A)  Final    GROUP A STREP (S.PYOGENES) ISOLATED HEALTH DEPARTMENT NOTIFIED Performed at [facility], [place] [address], [place], [place] [address]    Report Status 06/20/2022 FINAL  Final   Organism ID, Bacteria GROUP A STREP (S.PYOGENES) ISOLATED  Final      Susceptibility   Group a strep (s.pyogenes) isolated - MIC*    PENICILLIN <=0.06 SENSITIVE Sensitive     CEFTRIAXONE <=0.12 SENSITIVE Sensitive     ERYTHROMYCIN <=0.12 SENSITIVE Sensitive     LEVOFLOXACIN 0.5 SENSITIVE Sensitive     VANCOMYCIN 0.5 SENSITIVE Sensitive     * GROUP A STREP (S.PYOGENES) ISOLATED  Blood culture (routine x 2)     Status: Abnormal   Collection Time: 06/17/22  1:57 AM   Specimen: BLOOD  Result Value Ref Range Status   Specimen Description   Final    BLOOD BLOOD LEFT WRIST Performed at [facility], [address], [place],  [address]  Special Requests   Final    BOTTLES DRAWN AEROBIC AND ANAEROBIC Blood Culture adequate volume Performed at [facility], [address] [place], [place] [address]    Culture  Setup Time   Final    GRAM POSITIVE COCCI IN CHAINS AEROBIC BOTTLE ONLY CRITICAL VALUE NOTED.  VALUE IS CONSISTENT WITH PREVIOUSLY REPORTED AND CALLED VALUE.    Culture (A)  Final    GROUP A STREP (S.PYOGENES) ISOLATED SUSCEPTIBILITIES PERFORMED ON PREVIOUS CULTURE WITHIN THE LAST 5 DAYS. Performed at [facility], [place] [address] [place], [place] [address]    Report Status 06/20/2022 FINAL  Final  SARS Coronavirus 2 by RT PCR (hospital order, performed in [facility] hospital lab) *cepheid single result test* Anterior Nasal Swab     Status: None   Collection Time: 06/17/22  1:57 AM   Specimen: Anterior Nasal Swab  Result Value Ref Range Status   SARS Coronavirus 2 by RT PCR NEGATIVE NEGATIVE Final    Comment: (NOTE) SARS-CoV-2 target nucleic acids are NOT DETECTED.  The SARS-CoV-2 RNA is generally detectable  in upper and lower respiratory specimens during the acute phase of infection. The lowest concentration of SARS-CoV-2 viral copies this assay can detect is 250 copies / mL. A negative result does not preclude SARS-CoV-2 infection and should not be used as the sole basis for treatment or other patient management decisions.  A negative result may occur with improper specimen collection / handling, submission of specimen other than nasopharyngeal swab, presence of viral mutation(s) within the areas targeted by this assay, and inadequate number of viral copies (<250 copies / mL). A negative result must be combined with clinical observations, patient history, and epidemiological information.  Fact Sheet for Patients:   https://www.patel.info/  Fact Sheet for Healthcare Providers: https://hall.com/  This test is not yet approved or  cleared by the [place] FDA and has been authorized for detection and/or diagnosis of SARS-CoV-2 by FDA under an Emergency Use Authorization (EUA).  This EUA will remain in effect (meaning this test can be used) for the duration of the COVID-19 declaration under Section 564(b)(1) of the Act, 21 U.S.C. section 360bbb-3(b)(1), unless the authorization is terminated or revoked sooner.  Performed at [facility], [address] [place], [place] [address]   Blood Culture ID Panel (Reflexed)     Status: Abnormal   Collection Time: 06/17/22  1:57 AM  Result Value Ref Range Status   Enterococcus faecalis NOT DETECTED NOT DETECTED Final   Enterococcus Faecium NOT DETECTED NOT DETECTED Final   Listeria monocytogenes NOT DETECTED NOT DETECTED Final   Staphylococcus species NOT DETECTED NOT DETECTED Final   Staphylococcus aureus (BCID) NOT DETECTED NOT DETECTED Final   Staphylococcus epidermidis NOT DETECTED NOT DETECTED Final   Staphylococcus lugdunensis NOT DETECTED NOT DETECTED Final   Streptococcus species  DETECTED (A) NOT DETECTED Final    Comment: CRITICAL RESULT CALLED TO, READ BACK BY AND VERIFIED WITH: RAQUEL RODRIGUEZ GUZMAN ON 06/17/22 AT 1542 QSD    Streptococcus agalactiae NOT DETECTED NOT DETECTED Final   Streptococcus pneumoniae NOT DETECTED NOT DETECTED Final   Streptococcus pyogenes DETECTED (A) NOT DETECTED Final    Comment: CRITICAL RESULT CALLED TO, READ BACK BY AND VERIFIED WITH: [place] ON 06/17/22 AT 1542 QSD    A.calcoaceticus-baumannii NOT DETECTED NOT DETECTED Final   Bacteroides fragilis NOT DETECTED NOT DETECTED Final   Enterobacterales NOT DETECTED NOT DETECTED Final   Enterobacter cloacae complex NOT DETECTED  NOT DETECTED Final   Escherichia coli NOT DETECTED NOT DETECTED Final   Klebsiella aerogenes NOT DETECTED NOT DETECTED Final   Klebsiella oxytoca NOT DETECTED NOT DETECTED Final   Klebsiella pneumoniae NOT DETECTED NOT DETECTED Final   Proteus species NOT DETECTED NOT DETECTED Final   Salmonella species NOT DETECTED NOT DETECTED Final   Serratia marcescens NOT DETECTED NOT DETECTED Final   Haemophilus influenzae NOT DETECTED NOT DETECTED Final   Neisseria meningitidis NOT DETECTED NOT DETECTED Final   Pseudomonas aeruginosa NOT DETECTED NOT DETECTED Final   Stenotrophomonas maltophilia NOT DETECTED NOT DETECTED Final   Candida albicans NOT DETECTED NOT DETECTED Final   Candida auris NOT DETECTED NOT DETECTED Final   Candida glabrata NOT DETECTED NOT DETECTED Final   Candida krusei NOT DETECTED NOT DETECTED Final   Candida parapsilosis NOT DETECTED NOT DETECTED Final   Candida tropicalis NOT DETECTED NOT DETECTED Final   Cryptococcus neoformans/gattii NOT DETECTED NOT DETECTED Final    Comment: Performed at [facility], [address], [place], [place] [address]  Urine Culture     Status: Abnormal   Collection Time: 06/17/22  3:00 AM   Specimen: Urine, Random  Result Value Ref Range Status   Specimen Description   Final     URINE, RANDOM Performed at [facility], [address] [place], [place] [address]    Special Requests   Final    NONE Performed at [facility], [address] [place], [place] [address]    Culture MULTIPLE SPECIES PRESENT, SUGGEST RECOLLECTION (A)  Final   Report Status 06/18/2022 FINAL  Final  MRSA Next Gen by PCR, Nasal     Status: None   Collection Time: 06/18/22  1:24 PM   Specimen: Nasal Mucosa; Nasal Swab  Result Value Ref Range Status   MRSA by PCR Next Gen NOT DETECTED NOT DETECTED Final    Comment: (NOTE) The GeneXpert MRSA Assay (FDA approved for NASAL specimens only), is one component of a comprehensive MRSA colonization surveillance program. It is not intended to diagnose MRSA infection nor to guide or monitor treatment for MRSA infections. Test performance is not FDA approved in patients less than 49 years old. Performed at [facility], [address] [place], [place] [address]   Culture, blood (Routine X 2) w Reflex to ID Panel     Status: None   Collection Time: 06/19/22  5:02 AM   Specimen: BLOOD  Result Value Ref Range Status   Specimen Description BLOOD LEFT [facility]  Final   Special Requests   Final    BOTTLES DRAWN AEROBIC AND ANAEROBIC Blood Culture adequate volume   Culture   Final    NO GROWTH 5 DAYS Performed at [facility], [address] [place],  [address]    Report Status 06/24/2022 FINAL  Final  Culture, blood (Routine X 2) w Reflex to ID Panel     Status: None   Collection Time: 06/19/22  5:02 AM   Specimen: BLOOD  Result Value Ref Range Status   Specimen Description BLOOD RIGHT [facility]  Final   Special Requests   Final    IN PEDIATRIC BOTTLE Blood Culture results may not be optimal due to an excessive volume of blood received in culture bottles   Culture   Final    NO GROWTH 5 DAYS Performed at [facility], [address], [place],  [address]    Report Status 06/24/2022 FINAL   Final  Aerobic Culture w Gram Stain (superficial specimen)     Status: None   Collection Time: 06/19/22  1:37 PM   Specimen: Eye; Wound  Result Value Ref Range Status   Specimen Description   Final    EYE LEFT EYE Performed at [facility], [address] [place], [place] [address]    Special Requests   Final    NONE Performed at [facility], [address] [place], [place] [address]    Gram Stain   Final    RARE WBC PRESENT, PREDOMINANTLY MONONUCLEAR NO ORGANISMS SEEN Performed at [facility], [place] [address], [place], [place] [address]    Culture   Final    RARE STAPHYLOCOCCUS EPIDERMIDIS RARE STAPHYLOCOCCUS HOMINIS    Report Status 06/23/2022 FINAL  Final   Organism ID, Bacteria STAPHYLOCOCCUS EPIDERMIDIS  Final   Organism ID, Bacteria STAPHYLOCOCCUS HOMINIS  Final      Susceptibility   Staphylococcus epidermidis - MIC*    CIPROFLOXACIN <=0.5 SENSITIVE Sensitive     ERYTHROMYCIN >=8 RESISTANT Resistant     GENTAMICIN <=0.5 SENSITIVE Sensitive     OXACILLIN >=4 RESISTANT Resistant     TETRACYCLINE <=1 SENSITIVE Sensitive     VANCOMYCIN 1 SENSITIVE Sensitive     TRIMETH/SULFA <=10 SENSITIVE Sensitive     CLINDAMYCIN <=0.25 SENSITIVE Sensitive     RIFAMPIN <=0.5 SENSITIVE Sensitive     Inducible Clindamycin NEGATIVE Sensitive     * RARE STAPHYLOCOCCUS EPIDERMIDIS   Staphylococcus hominis - MIC*    CIPROFLOXACIN <=0.5 SENSITIVE Sensitive     ERYTHROMYCIN >=8 RESISTANT Resistant     GENTAMICIN <=0.5 SENSITIVE Sensitive     OXACILLIN RESISTANT Resistant     TETRACYCLINE <=1 SENSITIVE Sensitive     VANCOMYCIN 1 SENSITIVE Sensitive     TRIMETH/SULFA 40 SENSITIVE Sensitive     CLINDAMYCIN <=0.25 SENSITIVE Sensitive     RIFAMPIN <=0.5 SENSITIVE Sensitive     Inducible Clindamycin NEGATIVE Sensitive     * RARE STAPHYLOCOCCUS HOMINIS    Labs: CBC: Recent Labs  Lab 06/22/22 0553 06/23/22 0347 06/24/22 0508 06/25/22 0409  WBC 7.7 9.5 8.8  7.5  HGB 8.5* 9.7* 9.3* 9.3*  HCT 26.4* 29.0* 27.9* 28.7*  MCV 99.6 97.3 97.2 97.6  PLT 111* 139* 154 678   Basic Metabolic Panel: Recent Labs  Lab 06/22/22 0553 06/23/22 0347 06/24/22 0508 06/25/22 0409 06/26/22 0438 06/27/22 0330  NA 133* 132* 130* 133*  --   --   K 3.8 3.7 3.8 3.9  --   --   CL 101 100 97* 97*  --   --   CO2 _0 --   --   GLUCOSE 101* 94 108* 97  --   --   BUN _1 --   --   CREATININE 0.55 0.57 0.61 0.57  --   --   CALCIUM 8.3* 7.9* 8.1* 8.8*  --   --   MG 1.7 1.6* 1.7 1.6* 1.9 2.0  PHOS 2.1* 3.7 5.1* 4.8* 4.3 4.8*   Liver Function Tests: No results for input(s): "AST", "ALT", "ALKPHOS", "BILITOT", "PROT", "ALBUMIN" in the last 168 hours. CBG: Recent Labs  Lab 06/27/22 0012 06/27/22 0444 06/27/22 0729 06/27/22 1217 06/27/22 1610  GLUCAP 125* 87 95 108* 96    Discharge time spent: less than 30 minutes.  Signed: Annita Brod, MD [facility] 06/28/2022

## 2022-06-28 NOTE — TOC Progression Note (Addendum)
Transition of Care [facility]) - Progression Note    Patient Details  Name: Anna Lucas MRN: [id number] Date of Birth: [birth year]  Transition of Care [facility]) CM/SW Contact  Truddie Hidden, RN Phone Number: 06/28/2022, 3:42 PM  Clinical Narrative:    Discharge order received. Patient's mother contacted to have her transportation arranged. Nurse notfified    Expected Discharge Plan: Home w Home Health Services Barriers to Discharge: Continued Medical Work up  Expected Discharge Plan and Services Expected Discharge Plan: Home w Home Health Services In-house Referral: Clinical Social Work Discharge Planning Services: CM Consult Post Acute Care Choice: Home Health Living arrangements for the past 2 months: Single Family Home Expected Discharge Date: 06/28/22               DME Arranged: N/A DME Agency: NA       HH Arranged: PT HH Agency: [facility] Date HH Agency Contacted: 06/25/22 Time HH Agency Contacted: 1225 Representative spoke with at [facility] Agency: Ambulance person   Social Determinants of Health (SDOH) Interventions    Readmission Risk Interventions     No data to display

## 2022-06-28 NOTE — Discharge Instructions (Signed)
-  Home Health Physical Therapy no longer required per verification with Dr Chancy Milroy on 06/28/2022. -call [facility] to establish a primary physician per Dr. Kandis Fantasia on 06/28/2022. -see the Orthopedic physician that the [facility] ER referred in the past per Dr. Chancy Milroy on 06/28/2022.

## 2022-06-28 NOTE — TOC Progression Note (Signed)
Transition of Care [facility]) - Progression Note    Patient Details  Name: SAKEENA TEALL MRN: [id number] Date of Birth: 1971-07-05  Transition of Care [facility]) CM/SW Contact  Truddie Hidden, RN Phone Number: 06/28/2022, 10:19 AM  Clinical Narrative:    Spoke with patient's mother. Patient's parent are at [facility]. They are requesting to be called when patient is ready for discharge and they will arrange her transportation. A family friend named Clydie Braun will transport patient home.    Expected Discharge Plan: Home w Home Health Services Barriers to Discharge: Continued Medical Work up  Expected Discharge Plan and Services Expected Discharge Plan: Home w Home Health Services In-house Referral: Clinical Social Work Discharge Planning Services: CM Consult Post Acute Care Choice: Home Health Living arrangements for the past 2 months: Single Family Home                 DME Arranged: N/A DME Agency: NA       HH Arranged: PT HH Agency: [facility] Date HH Agency Contacted: 06/25/22 Time HH Agency Contacted: 1225 Representative spoke with at [facility] Agency: Ambulance person   Social Determinants of Health (SDOH) Interventions    Readmission Risk Interventions     No data to display

## 2022-06-28 NOTE — Progress Notes (Signed)
MD order received in [facility] to discharge pt home today; verbally reviewed AVS with patient; pt verbalized understanding with no questions voiced at this time; pt discharged via wheelchair by nursing to the [facility] entrance

## 2022-06-28 NOTE — Plan of Care (Signed)

## 2022-06-28 NOTE — Progress Notes (Signed)
MD order received in [facility] to discharge pt home today; returned pt's stored medication, "Oxycodone 5mg  tab, 7 total tablets" that were previously locked up in the [facility] pharmacy; pt verbalized that her ride can not pick her up until after 1630; pt ambulating in the hallway and then will take a shower

## 2022-06-28 NOTE — TOC Benefit Eligibility Note (Signed)
Patient Product/process development scientist completed.    The patient is currently admitted and upon discharge could be taking linezolid (Zyvox) 600 mg tablets.  The current 4 day co-pay is, $2.50.   The patient is insured through [employer]    Roland Earl, CPhT Pharmacy Patient Advocate Specialist [facility] Patient Advocate Team Direct Number: [phone]  Fax: [phone]

## 2022-06-29 ENCOUNTER — Encounter: Payer: Self-pay | Admitting: Internal Medicine

## 2022-06-29 NOTE — Progress Notes (Signed)
Patient HH arranged with [facility] PT. Start of care is 07/03/2022. MD aware

## 2022-07-03 ENCOUNTER — Ambulatory Visit: Payer: 59 | Attending: Infectious Diseases | Admitting: Infectious Diseases

## 2022-07-03 DIAGNOSIS — H00034 Abscess of left upper eyelid: Secondary | ICD-10-CM | POA: Insufficient documentation

## 2022-07-03 DIAGNOSIS — L0291 Cutaneous abscess, unspecified: Secondary | ICD-10-CM | POA: Diagnosis not present

## 2022-07-03 DIAGNOSIS — R531 Weakness: Secondary | ICD-10-CM | POA: Insufficient documentation

## 2022-07-03 DIAGNOSIS — I10 Essential (primary) hypertension: Secondary | ICD-10-CM | POA: Insufficient documentation

## 2022-07-03 DIAGNOSIS — R5383 Other fatigue: Secondary | ICD-10-CM | POA: Diagnosis not present

## 2022-07-03 DIAGNOSIS — L03211 Cellulitis of face: Secondary | ICD-10-CM | POA: Insufficient documentation

## 2022-07-03 DIAGNOSIS — A491 Streptococcal infection, unspecified site: Secondary | ICD-10-CM | POA: Diagnosis not present

## 2022-07-03 MED ORDER — SULFAMETHOXAZOLE-TRIMETHOPRIM 800-160 MG PO TABS: 1.0000 | ORAL_TABLET | Freq: Two times a day (BID) | ORAL | 0 refills | Status: DC

## 2022-07-03 MED ORDER — AMOXICILLIN-POT CLAVULANATE 875-125 MG PO TABS: 1.0000 | ORAL_TABLET | Freq: Two times a day (BID) | ORAL | 0 refills | Status: DC

## 2022-07-03 NOTE — Progress Notes (Signed)
NAME: Anna Lucas  DOB: 14-Jun-1971  MRN: [id number]  Date/Time: 07/03/2022 9:15 AM   Subjective:   Anna Lucas is a 51 y.o. female with AUD/HTN ?video visit  Pt is in her group home Provider in th office After recent hospitalization for group A streptococcus pyogenes bacteremia, toxic shock syndrome, infection of the face, eye lids following a fall and cutting her forehea While int he hospital she got IV ceftriaxone, then linezolid, got IVIG X 3 doses- she was intubated for a few days for air way protection because of face and neck swelling She had b/l upperlid swelling, then wound, scabs She is now c/o a swelling left upperlid and some discharge No fever or chills No cough or sob No diarrhea Some weakness and fatigue No urinary symptoms Appetite poor, trying to eat more Has completed linezolid on 07/01/22   Past Medical History:  Diagnosis Date   Acid reflux    Alcohol abuse    Anxiety    Depression    Hypertension    Hypomagnesemia 05/12/2022   Hyponatremia 05/12/2022   Hypophosphatemia 05/12/2022   PTSD (post-traumatic stress disorder)     Past Surgical History:  Procedure Laterality Date   CESAREAN SECTION      Social History   Socioeconomic History   Marital status: Single    Spouse name: Not on file   Number of children: Not on file   Years of education: Not on file   Highest education level: Not on file  Occupational History   Not on file  Tobacco Use   Smoking status: Every Day    Packs/day: 1.00    Types: Cigarettes   Smokeless tobacco: Never  Substance and Sexual Activity   Alcohol use: Yes    Comment: twice a week   Drug use: Never   Sexual activity: Not on file  Other Topics Concern   Not on file  Social History Narrative   ** Merged History Encounter **       Social Determinants of Health   Financial Resource Strain: Not on file  Food Insecurity: Not on file  Transportation Needs: Not on file  Physical Activity: Not on file  Stress:  Not on file  Social Connections: Not on file  Intimate Partner Violence: Not on file    Family History  Problem Relation Age of Onset   CAD Paternal Grandfather    Stroke Paternal Grandfather    Diabetes Mellitus II Maternal Grandmother    Emphysema Maternal Grandmother    Allergies  Allergen Reactions   Prednisone     Feet swelling    I? Current Outpatient Medications  Medication Sig Dispense Refill   albuterol (PROVENTIL HFA;VENTOLIN HFA) 108 (90 Base) MCG/ACT inhaler Inhale 2 puffs into the lungs every 6 (six) hours as needed for wheezing or shortness of breath. 1 Inhaler 0   amLODipine (NORVASC) 5 MG tablet Take 1 tablet (5 mg total) by mouth daily. 30 tablet 2   citalopram (CELEXA) 40 MG tablet Take 40 mg by mouth daily.     erythromycin ophthalmic ointment Place into both eyes 4 (four) times daily. 3.5 g 0   folic acid (FOLVITE) 1 MG tablet Take 1 tablet (1 mg total) by mouth daily. 30 tablet 2   gabapentin (NEURONTIN) 300 MG capsule Take 1 capsule (300 mg total) by mouth 3 (three) times daily.     hydrOXYzine (ATARAX) 50 MG tablet Take 1 tablet (50 mg total) by mouth 3 (three) times daily  as needed. 30 tablet 0   linezolid (ZYVOX) 600 MG tablet Take 1 tablet (600 mg total) by mouth 2 (two) times daily. 5 tablet 0   Multiple Vitamin (MULTIVITAMIN WITH MINERALS) TABS tablet Take 1 tablet by mouth daily.     omeprazole (PRILOSEC) 40 MG capsule Take 40 mg by mouth daily.     oxyCODONE (OXY IR/ROXICODONE) 5 MG immediate release tablet Take 1 tablet (5 mg total) by mouth every 4 (four) hours as needed for up to 5 days. For up to 5 days. 15 tablet 0   thiamine 100 MG tablet Take 1 tablet (100 mg total) by mouth daily. 30 tablet 2   traZODone (DESYREL) 50 MG tablet Take 1 tablet (50 mg total) by mouth at bedtime. 30 tablet 1   No current facility-administered medications for this visit.     Abtx:  Anti-infectives (From admission, onward)    None       REVIEW OF SYSTEMS:   As above Objective:   PHYSICAL EXAM:  General: Alert, cooperative,  Steristrips over the forehead Swelling rt side of the forehead Left upper  lid swelling Localized circumscribed swelling inner side of the left upper lid    Pertinent Labs none ? Impression/Recommendation ? ?recent streptococcus pyogenes bacteremia with toxic shocksyndrome-   b/l upper lid infection, facial cellulitis and hematoma  treated with IV antibiotics, IVIG and was discharged on PO linezolid to complete 2 weeks of treatment, which she finishe don 07/01/22  Also had staph hominis and epidermidis in the eye fluid culture She now has an abscess left upper lid- need urgent opthal referral Spoke to [facility] during the video visit and they will call her  Augmentin 875 mg PO BID + bactrim DS IB  X 2 weeks- sent to pharmacy  Also ithe swelling  on the rt side of the forehead will be looked by opthal- if they cannot do any I/D will refer to surgeon  Follow up 1 week- video Total time spent on the call 20 min? ___________________________________________________ Discussed with patient in detail Will call [facility] again  tomorrow  Note:  This document was prepared using Dragon voice recognition software and may include unintentional dictation errors.

## 2022-07-05 ENCOUNTER — Other Ambulatory Visit
Admission: RE | Admit: 2022-07-05 | Discharge: 2022-07-05 | Disposition: A | Discharge status: Home / Self Care | Payer: 59 | Source: Ambulatory Visit | Attending: Oculoplastics Ophthalmology | Admitting: Oculoplastics Ophthalmology

## 2022-07-05 DIAGNOSIS — H00034 Abscess of left upper eyelid: Secondary | ICD-10-CM | POA: Diagnosis present

## 2022-07-06 ENCOUNTER — Other Ambulatory Visit: Payer: Self-pay

## 2022-07-08 LAB — AEROBIC CULTURE W GRAM STAIN (SUPERFICIAL SPECIMEN): Culture: NO GROWTH

## 2022-07-10 ENCOUNTER — Ambulatory Visit: Payer: 59 | Attending: Infectious Diseases | Admitting: Infectious Diseases

## 2022-07-10 DIAGNOSIS — H00034 Abscess of left upper eyelid: Secondary | ICD-10-CM | POA: Diagnosis not present

## 2022-07-10 DIAGNOSIS — L0201 Cutaneous abscess of face: Secondary | ICD-10-CM | POA: Insufficient documentation

## 2022-07-10 NOTE — Progress Notes (Signed)
The purpose of this virtual visit is to provide medical care while limiting exposure to the novel coronavirus (COVID19) for both patient and office staff.   Consent was obtained for video visit:  Yes.   Answered questions that patient had about telehealth interaction:  Yes.   I discussed the limitations, risks, security and privacy concerns of performing an evaluation and management service by telephone. I also discussed with the patient that there may be a patient responsible charge related to this service. The patient expressed understanding and agreed to proceed.   Patient Location: Home Provider Location: office Follow up to last week's visit when I had referred to Ophthalmologist for abscess She says she saw them on 7/13 and had I/D of the rt forehead abscess and left medial side of the upper lid abscess and culture was sent  I dont have records from that visit- I called [facility] and left message She is continuing on augmentin and bactrim  She recently had Strep pyogenes bacteremia and facial cellulitis and was admitted to [facility]  O/e the abscess on the forehead is much better Left upper eye nodular abscess is present but smaller, not drained completely as it was granular according to her  Follow up with opthal this thursday'will get records from them Continue augmentin and bactrim  Total time spent on the video visit 12 min

## 2022-07-17 ENCOUNTER — Other Ambulatory Visit: Payer: Self-pay

## 2022-07-17 MED ORDER — AMOXICILLIN-POT CLAVULANATE 875-125 MG PO TABS: 1.0000 | ORAL_TABLET | Freq: Two times a day (BID) | ORAL | 0 refills | Status: DC

## 2022-07-17 MED ORDER — SULFAMETHOXAZOLE-TRIMETHOPRIM 800-160 MG PO TABS: 1.0000 | ORAL_TABLET | Freq: Two times a day (BID) | ORAL | 0 refills | Status: DC

## 2022-07-31 ENCOUNTER — Ambulatory Visit: Payer: 59 | Attending: Infectious Diseases | Admitting: Infectious Diseases

## 2022-07-31 DIAGNOSIS — L0201 Cutaneous abscess of face: Secondary | ICD-10-CM

## 2022-07-31 DIAGNOSIS — L0291 Cutaneous abscess, unspecified: Secondary | ICD-10-CM | POA: Insufficient documentation

## 2022-07-31 DIAGNOSIS — B999 Unspecified infectious disease: Secondary | ICD-10-CM | POA: Insufficient documentation

## 2022-07-31 DIAGNOSIS — A491 Streptococcal infection, unspecified site: Secondary | ICD-10-CM

## 2022-07-31 MED ORDER — FLUCONAZOLE 150 MG PO TABS: 150.0000 mg | ORAL_TABLET | Freq: Every day | ORAL | 0 refills | Status: DC

## 2022-07-31 NOTE — Progress Notes (Signed)
The purpose of this virtual visit is to provide medical care while limiting exposure to the novel coronavirus (COVID19) for both patient and office staff.   Consent was obtained for video visit:  Yes.   Answered questions that patient had about telehealth interaction:  Yes.   I discussed the limitations, risks, security and privacy concerns of performing an evaluation and management service by telephone. I also discussed with the patient that there may be a patient responsible charge related to this service. The patient expressed understanding and agreed to proceed.   Patient Location: Home Provider Location: office  Follow up visit after recent group A streptococcal infection with toxic shock syndrome, face cellulitis and abscess over left eye lid and rt forehead She was in [facility] between 6/24-06/28/22 and was in ICU for a short time- needed IVIG X3, IV antibiotics She was discharged on Po augmenitn and bactrim ( staph hominis/epi in left eye lid culture) and after that saw the opthalmology and eyelid surgeon, who drained the abscess on the rt forehead. The left eyelid was more organized and could not be drained- culture was neg from   O/e she is doing much better Swelling lids and forehead almost resolved  07/31/22     07/03/22   ? Impression/Recommendation ? Recent streptococcus pyogenes bacteremia with toxic shocksyndrome-   b/l upper lid infection, facial cellulitis   treated with IV antibiotics, IVIG and was discharged on PO linezolid to complete 2 weeks of treatment, which she finished on 07/01/22   Also had staph hominis and epidermidis in the eye fluid culture She then had an abscess left frontal area and an  abscess left upper lid- seen by ophthalmologist and eyelid surgeon and the rt drained and left was unable to be drained as it was more organized Culture NG ON Augmentin 875 mg PO BID + bactrim DS IB  X 2 weeks- has not been taking it regularly-   Has vulvovaginal candidiasis-  will give fluconazole 150mg  one dose with another in hand   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

## 2022-08-08 ENCOUNTER — Telehealth: Payer: Self-pay

## 2022-08-08 NOTE — Telephone Encounter (Signed)
Patient called office today stating she has completed her antibiotics and would like to know if she can return to work. States she is not sure if provider still wanted to do nasal swab.  Will forward message to MD to advise. Juanita Laster, RMA

## 2022-08-09 NOTE — Telephone Encounter (Signed)
Spoke with provider who would like for patient to be seen ASAP.  Attempted to call her and schedule appointment for 8/24. Left voicemail requesting call back is currently scheduled for 8/24 at 9:15. Juanita Laster, RMA

## 2022-08-14 IMAGING — MR MR ABDOMEN WO/W CM
18 series · 48 of 48 positions shown · IV contrast (6ml Gadavist)
Comparison: CT on 05/11/2022

CLINICAL DATA: Cirrhosis. Elevated ferritin. Elevated liver
function tests. Alcohol abuse. Suspect hemochromatosis.

EXAM:
MRI ABDOMEN WITHOUT AND WITH CONTRAST
TECHNIQUE: Multiplanar multisequence MR imaging of the abdomen was performed
both before and after the administration of intravenous contrast.
CONTRAST:  7.5mL GADAVIST GADOBUTROL 1 MMOL/ML IV SOLN

[Series 3: T2 · coronal · 6.0mm · 1.12mm/px · 2 of 32 slices shown (1 of 2)]
[im 1/32]
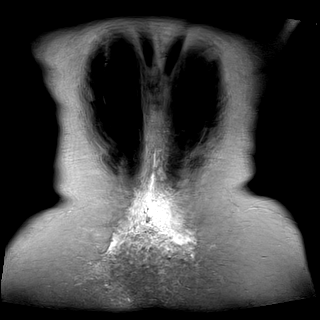
[im 32/32]
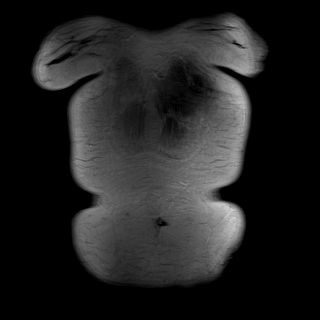

[Series 4: T2 · axial · 6.0mm · 1.12mm/px · z∈[-92,+131]mm · 2 of 32 slices shown (2 of 2)]
[im 1/32]
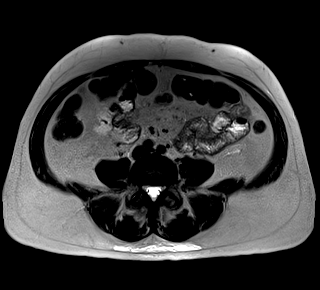
[im 32/32]
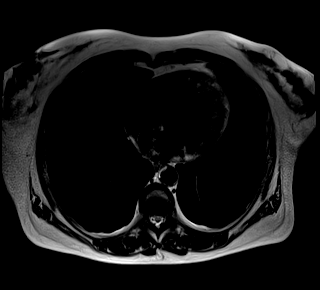

[Series 5: T1 · axial · 3.0mm · 1.12mm/px · z∈[-87,+126]mm · 4 of 72 slices shown (1 of 2)]
[im 1/72]
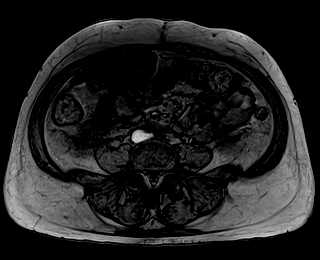
[im 24/72]
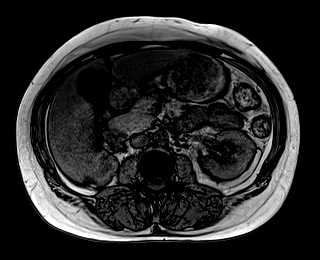
[im 48/72]
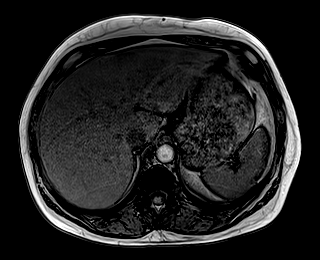
[im 72/72]
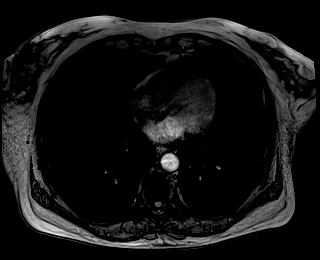

[Series 6: T1 · axial · 3.0mm · 1.12mm/px · z∈[-87,+126]mm · 3 of 72 slices shown (2 of 2)]
[im 1/72]
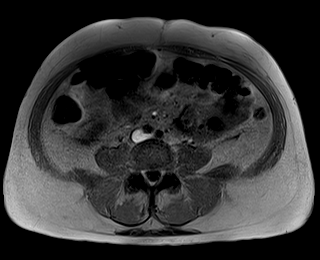
[im 36/72]
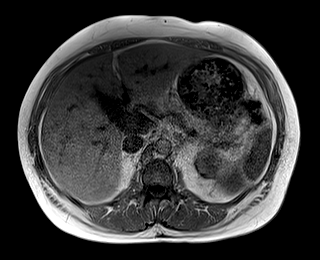
[im 72/72]
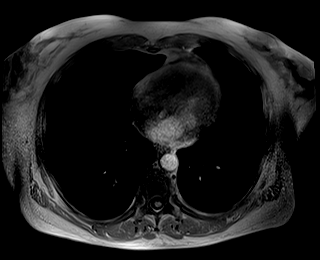

[Series 7: bSSFP · axial · 6.0mm · 0.70mm/px · 1 of 32 slices shown]
[im 1/32]
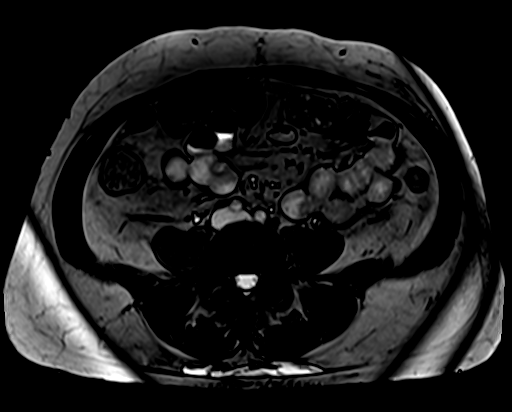

[Series 8: ax dwi_tracew · axial · 6.0mm · 1.34mm/px · z∈[-70,+139]mm · 4 of 90 slices shown]
[im 1/90]
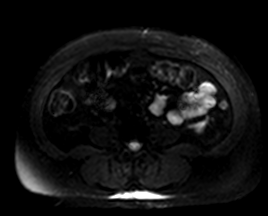
[im 30/90]
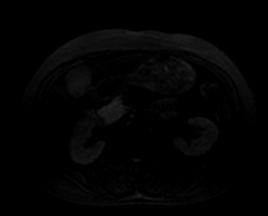
[im 60/90]
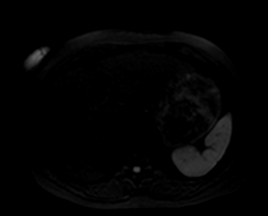
[im 90/90]
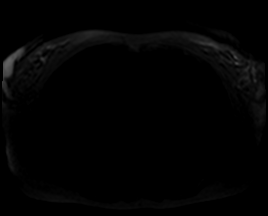

[Series 9: ax dwi_adc · axial · 6.0mm · 1.34mm/px · 1 of 30 slices shown]
[im 1/30]
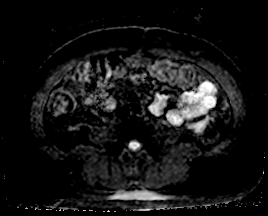

[Series 12: T2 fat-sat · axial · 6.0mm · 1.12mm/px · 1 of 30 slices shown]
[im 1/30]
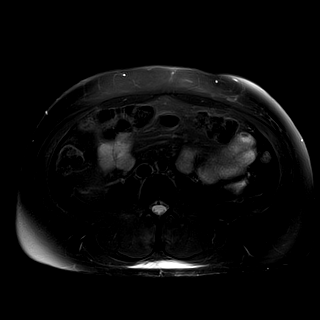

[Series 14: T1 dynamic fat-sat · axial · non-contrast · 3.0mm · 1.12mm/px · z∈[-81,+132]mm · 3 of 72 slices shown (1 of 5)]
[im 1/72]
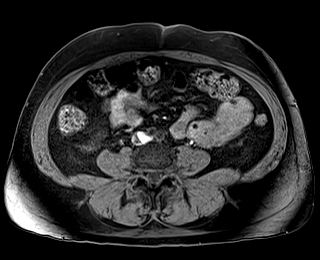
[im 36/72]
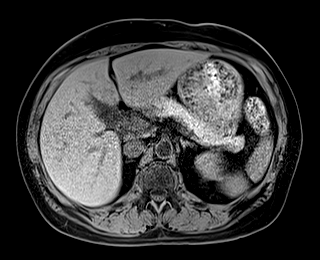
[im 72/72]
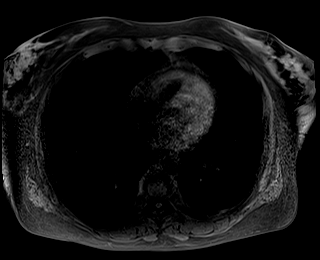

[Series 15: T1 dynamic fat-sat post-contrast · axial · 3.0mm · 1.12mm/px · z∈[-87,+126]mm · 3 of 72 slices shown (1 of 4)]
[im 1/72]
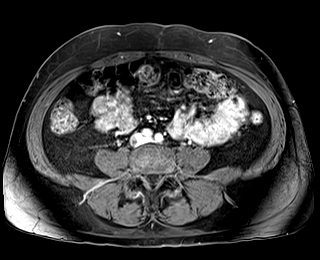
[im 36/72]
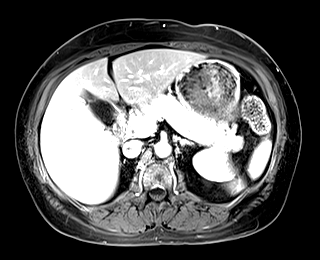
[im 72/72]
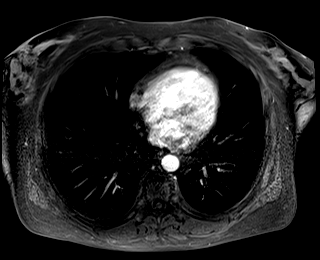

[Series 16: T1 dynamic fat-sat · axial · 3.0mm · 1.12mm/px · z∈[-87,+126]mm · 3 of 72 slices shown (2 of 5)]
[im 1/72]
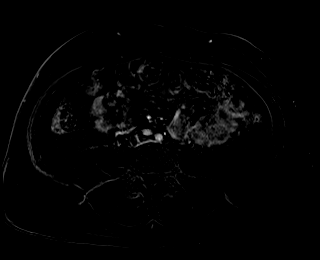
[im 36/72]
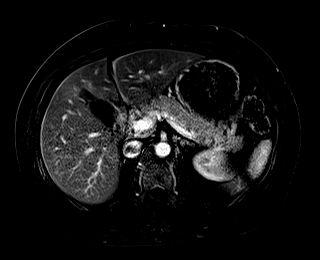
[im 72/72]
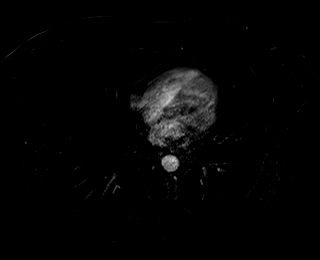

[Series 17: T1 dynamic fat-sat post-contrast · axial · 3.0mm · 1.12mm/px · z∈[-87,+126]mm · 3 of 72 slices shown (2 of 4)]
[im 1/72]
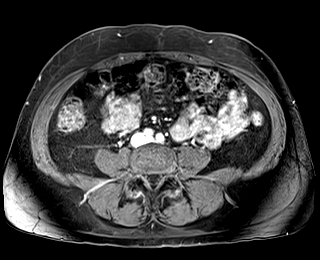
[im 36/72]
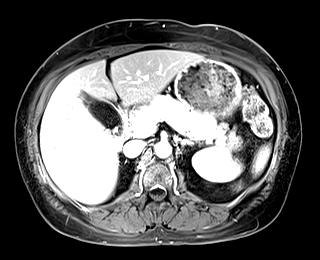
[im 72/72]
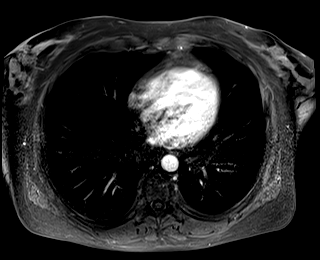

[Series 18: T1 dynamic fat-sat · axial · 3.0mm · 1.12mm/px · z∈[-87,+126]mm · 3 of 72 slices shown (3 of 5)]
[im 1/72]
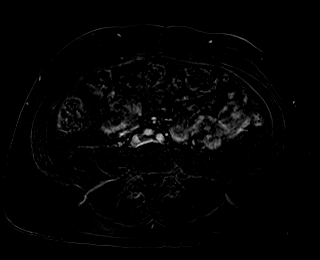
[im 36/72]
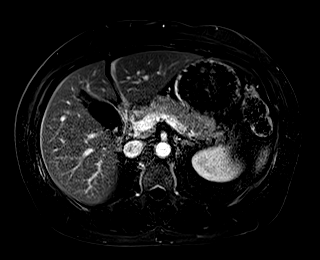
[im 72/72]
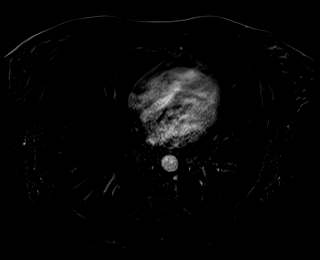

[Series 19: T1 dynamic fat-sat post-contrast · axial · 3.0mm · 1.12mm/px · z∈[-87,+126]mm · 3 of 72 slices shown (3 of 4)]
[im 1/72]
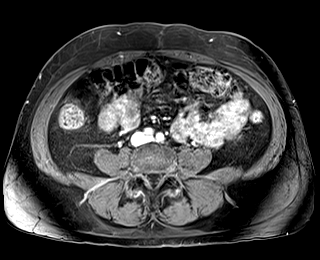
[im 36/72]
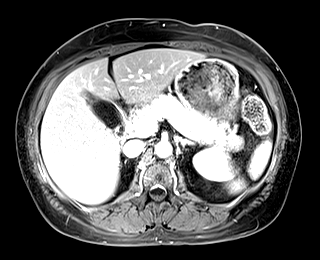
[im 72/72]
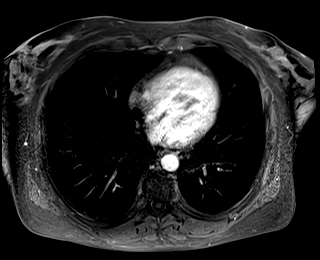

[Series 20: T1 dynamic fat-sat · axial · 3.0mm · 1.12mm/px · z∈[-87,+126]mm · 3 of 72 slices shown (4 of 5)]
[im 1/72]
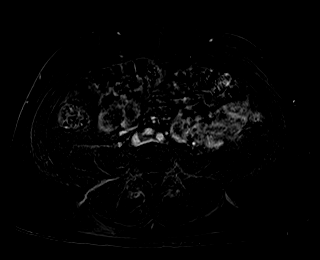
[im 36/72]
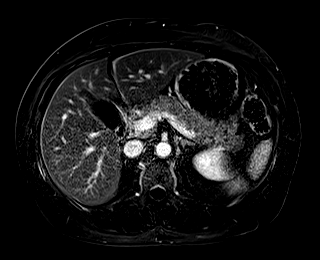
[im 72/72]
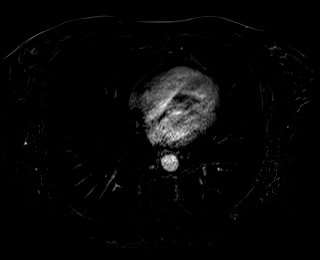

[Series 21: T1 dynamic post-contrast · coronal · 3.0mm · 1.12mm/px · 3 of 72 slices shown]
[im 1/72]
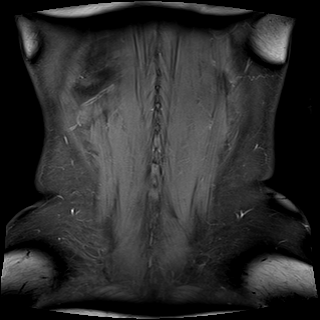
[im 36/72]
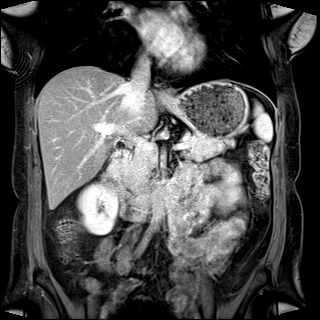
[im 72/72]
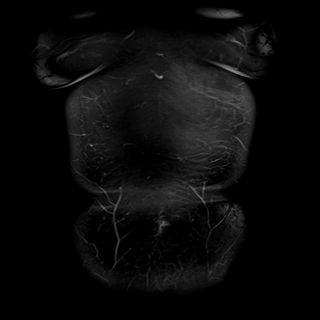

[Series 22: T1 dynamic fat-sat post-contrast · axial · 3.0mm · 1.12mm/px · z∈[-87,+126]mm · 3 of 72 slices shown (4 of 4)]
[im 1/72]
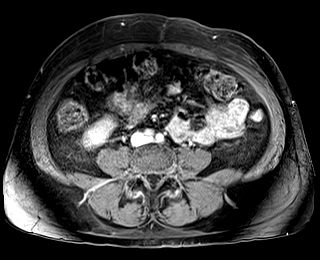
[im 36/72]
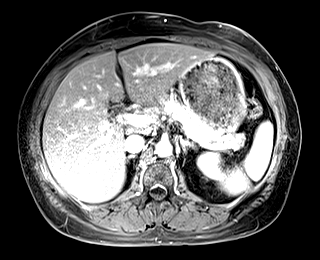
[im 72/72]
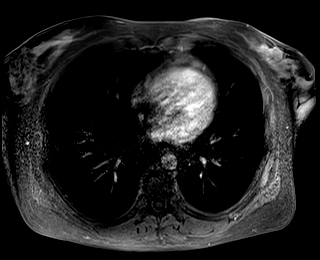

[Series 23: T1 dynamic fat-sat · axial · 3.0mm · 1.12mm/px · z∈[-87,+126]mm · 3 of 72 slices shown (5 of 5)]
[im 1/72]
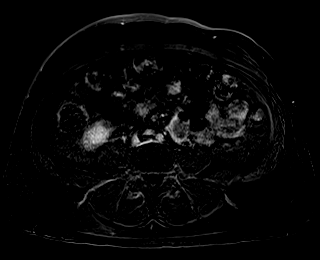
[im 36/72]
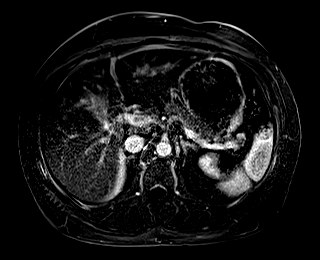
[im 72/72]
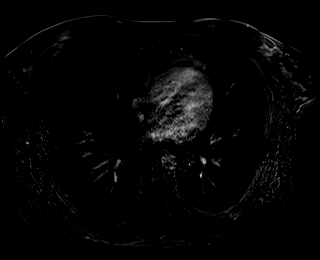

[48 of 48 positions shown; findings below may reference images not displayed]

FINDINGS: Lower chest: No acute findings.

Hepatobiliary: Normal signal intensity of the hepatic parenchyma,
without evidence of iron overload or steatosis. No gross morphologic
changes of cirrhosis noted. No hepatic masses identified.
Gallbladder is unremarkable. No evidence of biliary ductal
dilatation.

Pancreas: No mass or inflammatory changes. No evidence iron
deposition.

Spleen: Within normal limits in size and appearance. Normal T2
signal intensity, without evidence of iron deposition.

Adrenals/Urinary Tract: No masses identified. No evidence of
hydronephrosis.

Stomach/Bowel: Unremarkable.

Vascular/Lymphatic: No pathologically enlarged lymph nodes
identified. No acute vascular findings.

Other:  None.

Musculoskeletal: No suspicious bone lesions identified. No evidence
of excess iron deposition in bone marrow.
IMPRESSION: Negative. No radiographic evidence of hemochromatosis or other
hepatobiliary disease.

## 2022-08-16 ENCOUNTER — Other Ambulatory Visit
Admission: RE | Admit: 2022-08-16 | Discharge: 2022-08-16 | Disposition: A | Discharge status: Home / Self Care | Payer: 59 | Source: Ambulatory Visit | Attending: Infectious Diseases | Admitting: Infectious Diseases

## 2022-08-16 ENCOUNTER — Ambulatory Visit: Payer: 59 | Attending: Infectious Diseases | Admitting: Infectious Diseases

## 2022-08-16 ENCOUNTER — Encounter: Payer: Self-pay | Admitting: Infectious Diseases

## 2022-08-16 VITALS — BP 102/68 | HR 93 | Temp 97.9°F | Ht 65.5 in | Wt 153.0 lb

## 2022-08-16 DIAGNOSIS — B3731 Acute candidiasis of vulva and vagina: Secondary | ICD-10-CM | POA: Insufficient documentation

## 2022-08-16 DIAGNOSIS — A491 Streptococcal infection, unspecified site: Secondary | ICD-10-CM | POA: Insufficient documentation

## 2022-08-16 NOTE — Progress Notes (Signed)
   ID Follow up visit Pt was in the hospital in 06/16/22-06/28/22 for strep pyogenes bacteremia/TSS/face cellulitis and infection of the eye lids Was treated with IV antibiotics, IVIG, was intubated for a short period After discharge she was sent to opthal for the abscess that was developing on her rt forehead and left upperlid nad had I/D- culture neg- she completed 6 weeks of antibiotics She is here today to get clearance for work She works in an asisted living She is doing much better The swelling has resolved-  the left upper lid is itchy PMH       Past Medical History:  Diagnosis Date   Acid reflux     Alcohol abuse     Anxiety     Depression     Hypertension     Hypomagnesemia 05/12/2022   Hyponatremia 05/12/2022   Hypophosphatemia 05/12/2022   PTSD (post-traumatic stress disorder)      PSH- csection  SH_ smoker Used to drink alcohol but not now  FH CAD grand father DM grandmother  ROS No fever or chills No cough or sob Poor dentition is waiting to see dentist No pain abdomen.diarrhea/nausea or vomiting CNS non weakness   O/e looks well BP 102/68   Pulse 93   Temp 97.9 F (36.6 C) (Temporal)   Ht 5' 5.5" (1.664 m)   Wt 153 lb (69.4 kg)   LMP 07/24/2016   BMI 25.07 kg/m    Swelling of the upper lids and forehead reoslevd 2 pinpoint pustules on the left upper lid  08/16/22    07/31/22     07/03/22   ? Impression/Recommendation ? t streptococcus pyogenes bacteremia with toxic shocksyndrome-  resolved  b/l upper lid infection, facial cellulitis  - resolved treated with IV antibiotics, IVIG and was discharged on PO linezolid to complete 2 weeks of treatment, which she finished on 07/01/22     staph hominis and epidermidis in the eye fluid culture She then had an abscess left frontal area and an  abscess left upper lid- seen by ophthalmologist and eyelid surgeon and the rt drained and left was unable to be drained as it was more organized Culture  NG ON Augmentin 875 mg PO BID + bactrim DS IB  X 2 weeks-  Completed treatment and resolved infeciton 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

## 2022-08-16 NOTE — Patient Instructions (Signed)
Today you are for clearance work for streptococcus- will do culture and then clear you

## 2022-08-17 ENCOUNTER — Telehealth: Payer: Self-pay

## 2022-08-17 NOTE — Telephone Encounter (Addendum)
Patient called asking if wound swab results was back. Per Dr.Ravishankar so far wound swab results is negative once she receives final result she will give clearance for patient to go back to work. Patient aware and verbalized her understanding.   Mashal Slavick Lesli Albee, CMA

## 2022-08-18 LAB — AEROBIC CULTURE W GRAM STAIN (SUPERFICIAL SPECIMEN)
Culture: NORMAL
Gram Stain: NONE SEEN

## 2022-08-20 ENCOUNTER — Telehealth: Payer: Self-pay

## 2022-08-20 NOTE — Telephone Encounter (Signed)
Spoke with patient, let her know that she can return to work as swab was negative. She would like this result faxed and will send a MyChart message with the fax number.   Sandie Ano, RN

## 2022-08-20 NOTE — Telephone Encounter (Signed)
-----   Message from Lynn Ito, MD sent at 08/20/2022 11:17 AM EDT ----- Please let her know that she is cleared for work. As the swab was negative  thx ----- Message ----- From: Interface, Lab In [place] Sent: 08/16/2022   8:59 PM EDT To: Lynn Ito, MD

## 2022-08-23 ENCOUNTER — Telehealth: Payer: Self-pay

## 2022-08-23 ENCOUNTER — Other Ambulatory Visit: Payer: Self-pay

## 2022-08-23 NOTE — Telephone Encounter (Signed)
Patient called requesting a letter to return to work. Patient asked that the letter be faxed to [phone]. Letter has been faxed for the patient.  Reem Fleury T Pricilla Loveless

## 2022-09-05 ENCOUNTER — Other Ambulatory Visit: Payer: Self-pay

## 2022-09-05 ENCOUNTER — Inpatient Hospital Stay: Payer: BLUE CROSS/BLUE SHIELD | Attending: Oncology

## 2022-09-05 DIAGNOSIS — D61818 Other pancytopenia: Secondary | ICD-10-CM | POA: Insufficient documentation

## 2022-09-05 DIAGNOSIS — D696 Thrombocytopenia, unspecified: Secondary | ICD-10-CM | POA: Diagnosis not present

## 2022-09-05 LAB — TSH: TSH: 1.234 u[IU]/mL (ref 0.350–4.500)

## 2022-09-05 LAB — CBC WITH DIFFERENTIAL/PLATELET
Abs Immature Granulocytes: 0.02 10*3/uL (ref 0.00–0.07)
Basophils Absolute: 0 10*3/uL (ref 0.0–0.1)
Basophils Relative: 0 %
Eosinophils Absolute: 0.2 10*3/uL (ref 0.0–0.5)
Eosinophils Relative: 2 %
HCT: 34.7 % — ABNORMAL LOW (ref 36.0–46.0)
Hemoglobin: 10.9 g/dL — ABNORMAL LOW (ref 12.0–15.0)
Immature Granulocytes: 0 %
Lymphocytes Relative: 24 %
Lymphs Abs: 1.7 10*3/uL (ref 0.7–4.0)
MCH: 32 pg (ref 26.0–34.0)
MCHC: 31.4 g/dL (ref 30.0–36.0)
MCV: 101.8 fL — ABNORMAL HIGH (ref 80.0–100.0)
Monocytes Absolute: 0.5 10*3/uL (ref 0.1–1.0)
Monocytes Relative: 7 %
Neutro Abs: 4.7 10*3/uL (ref 1.7–7.7)
Neutrophils Relative %: 67 %
Platelets: 230 10*3/uL (ref 150–400)
RBC: 3.41 MIL/uL — ABNORMAL LOW (ref 3.87–5.11)
RDW: 13.1 % (ref 11.5–15.5)
WBC: 7 10*3/uL (ref 4.0–10.5)
nRBC: 0 % (ref 0.0–0.2)

## 2022-09-05 LAB — VITAMIN B12: Vitamin B-12: 169 pg/mL — ABNORMAL LOW (ref 180–914)

## 2022-09-05 LAB — FOLATE: Folate: 20 ng/mL (ref >5.9)

## 2022-09-05 LAB — FERRITIN: Ferritin: 158 ng/mL (ref 11–307)

## 2022-09-10 ENCOUNTER — Other Ambulatory Visit: Payer: Self-pay

## 2022-09-10 ENCOUNTER — Telehealth: Payer: Self-pay

## 2022-09-10 MED ORDER — B-12 COMPLIANCE INJECTION 1000 MCG/ML IJ KIT: 1000.0000 ug | PACK | INTRAMUSCULAR | 11 refills | Status: DC

## 2022-09-10 MED ORDER — "SYRINGE 25G X 1"" 3 ML MISC": 3.0000 mL | 11 refills | Status: DC

## 2022-09-10 NOTE — Telephone Encounter (Signed)
Pt stated she will like to self-administer b12 injections at home. Have sent prescription for approval and will be available for pt sometime today or tomorrow. Pt verbalized understanding.

## 2022-09-10 NOTE — Telephone Encounter (Signed)
-----   Message from Sindy Guadeloupe, MD sent at 09/06/2022 11:39 AM EDT ----- Needs to take po b12 or b12 injections at cancer center or self administer

## 2022-09-24 ENCOUNTER — Telehealth: Payer: Self-pay

## 2022-09-24 ENCOUNTER — Telehealth: Payer: Self-pay | Admitting: Oncology

## 2022-09-24 ENCOUNTER — Other Ambulatory Visit: Payer: Self-pay

## 2022-09-24 DIAGNOSIS — D61818 Other pancytopenia: Secondary | ICD-10-CM

## 2022-09-24 NOTE — Telephone Encounter (Signed)
Pt thinks her iron is low and requests advice on whether she can start iron injections again.

## 2022-09-24 NOTE — Telephone Encounter (Signed)
Patient left vm regarding iron infusions. She states her iron is at 33 now and would like to know what can be added to her infusions to help get that number beyond 38. She also complains of fatigue.

## 2022-09-24 NOTE — Telephone Encounter (Signed)
Orders have been entered for appt.

## 2022-09-24 NOTE — Telephone Encounter (Signed)
Please have her come for labs CBC ferritin and iron studies first before we can decide if she needs IV iron or not

## 2022-09-24 NOTE — Telephone Encounter (Signed)
Patient advised to reach out to oncology. They are monitoring her iron not Dr. Gwenevere Ghazi office

## 2022-09-26 ENCOUNTER — Inpatient Hospital Stay: Payer: BLUE CROSS/BLUE SHIELD | Attending: Oncology

## 2022-09-26 DIAGNOSIS — D61818 Other pancytopenia: Secondary | ICD-10-CM | POA: Diagnosis present

## 2022-09-26 DIAGNOSIS — D649 Anemia, unspecified: Secondary | ICD-10-CM | POA: Insufficient documentation

## 2022-09-26 LAB — CBC WITH DIFFERENTIAL/PLATELET
Abs Immature Granulocytes: 0 10*3/uL (ref 0.00–0.07)
Basophils Absolute: 0 10*3/uL (ref 0.0–0.1)
Basophils Relative: 0 %
Eosinophils Absolute: 0.4 10*3/uL (ref 0.0–0.5)
Eosinophils Relative: 11 %
HCT: 36.9 % (ref 36.0–46.0)
Hemoglobin: 12.3 g/dL (ref 12.0–15.0)
Immature Granulocytes: 0 %
Lymphocytes Relative: 39 %
Lymphs Abs: 1.5 10*3/uL (ref 0.7–4.0)
MCH: 32.9 pg (ref 26.0–34.0)
MCHC: 33.3 g/dL (ref 30.0–36.0)
MCV: 98.7 fL (ref 80.0–100.0)
Monocytes Absolute: 0.3 10*3/uL (ref 0.1–1.0)
Monocytes Relative: 8 %
Neutro Abs: 1.5 10*3/uL — ABNORMAL LOW (ref 1.7–7.7)
Neutrophils Relative %: 42 %
Platelets: 170 10*3/uL (ref 150–400)
RBC: 3.74 MIL/uL — ABNORMAL LOW (ref 3.87–5.11)
RDW: 13 % (ref 11.5–15.5)
WBC: 3.7 10*3/uL — ABNORMAL LOW (ref 4.0–10.5)
nRBC: 0 % (ref 0.0–0.2)

## 2022-09-26 LAB — IRON AND TIBC
Iron: 61 ug/dL (ref 28–170)
Saturation Ratios: 19 % (ref 10.4–31.8)
TIBC: 328 ug/dL (ref 250–450)
UIBC: 267 ug/dL

## 2022-09-26 LAB — FERRITIN: Ferritin: 118 ng/mL (ref 11–307)

## 2022-10-04 ENCOUNTER — Telehealth: Payer: Self-pay | Admitting: Oncology

## 2022-10-04 NOTE — Telephone Encounter (Signed)
pt called in requesting that Nurse for Dr. Janese Banks give her a call.Marland KitchenKJ

## 2022-10-05 ENCOUNTER — Other Ambulatory Visit: Payer: Self-pay | Admitting: *Deleted

## 2022-10-05 ENCOUNTER — Telehealth: Payer: Self-pay | Admitting: *Deleted

## 2022-10-05 MED ORDER — CYANOCOBALAMIN 1000 MCG/ML IJ SOLN: 1000.0000 ug | INTRAMUSCULAR | 0 refills | Status: DC

## 2022-10-05 MED ORDER — "SYRINGE 25G X 1"" 3 ML MISC": 10.0000 | 11 refills | Status: DC

## 2022-10-05 NOTE — Telephone Encounter (Signed)
Pt called about her labs and I spoke to Janese Banks and she says she does not need  iron infusion. Her b12 level is low. Pt. Was taking b12 inj. And she can feel she is more tired and went and got b12 and gave it 7 days early. I spoke to Janese Banks and she said to give her the b12 inj weekly for 4 weeks and then go back to monthly after 4 weeks. She wanted me to send the RX to [facility] and I could not find it. I called her back and she said that she can't find it either and told me to send it to [facility] [place] location and ordered it and sent to pharmacy and pt aware.

## 2022-10-08 ENCOUNTER — Telehealth: Payer: Self-pay | Admitting: *Deleted

## 2022-10-08 NOTE — Telephone Encounter (Signed)
Called the pt on Friday night 10/13 I had spoke to pt and she is on b12 shots and she missed one and she has been very tired. She went to the pharmacy and got b12 inj. That was her last one. She wanted to know if you can give her b12 inj more often. When I spoke to Janese Banks and she said that she can have it b12 weekly x 4 and then back to once a month.. the pt. Wanted me to send it to [facility]. I called her back and she could not figure out which pharmacy it is so she says to send it [facility] [place]. I sent the RX to her pharmacy . Pt is agreeable to this

## 2022-10-27 ENCOUNTER — Other Ambulatory Visit: Payer: Self-pay | Admitting: Oncology

## 2022-12-05 ENCOUNTER — Inpatient Hospital Stay: Payer: BLUE CROSS/BLUE SHIELD | Admitting: Oncology

## 2022-12-05 ENCOUNTER — Inpatient Hospital Stay: Payer: BLUE CROSS/BLUE SHIELD

## 2022-12-11 ENCOUNTER — Inpatient Hospital Stay: Payer: BLUE CROSS/BLUE SHIELD

## 2022-12-11 ENCOUNTER — Inpatient Hospital Stay: Payer: BLUE CROSS/BLUE SHIELD | Admitting: Oncology

## 2022-12-11 ENCOUNTER — Encounter: Payer: Self-pay | Admitting: Oncology

## 2022-12-12 ENCOUNTER — Inpatient Hospital Stay: Payer: BLUE CROSS/BLUE SHIELD | Attending: Oncology | Admitting: Oncology

## 2022-12-12 ENCOUNTER — Encounter: Payer: Self-pay | Admitting: Oncology

## 2022-12-12 ENCOUNTER — Inpatient Hospital Stay: Payer: BLUE CROSS/BLUE SHIELD

## 2022-12-12 VITALS — BP 125/87 | HR 76 | Temp 97.1°F | Resp 18 | Ht 65.5 in | Wt 162.0 lb

## 2022-12-12 DIAGNOSIS — D539 Nutritional anemia, unspecified: Secondary | ICD-10-CM | POA: Diagnosis not present

## 2022-12-12 DIAGNOSIS — F17210 Nicotine dependence, cigarettes, uncomplicated: Secondary | ICD-10-CM | POA: Diagnosis not present

## 2022-12-12 DIAGNOSIS — Z79899 Other long term (current) drug therapy: Secondary | ICD-10-CM | POA: Diagnosis not present

## 2022-12-12 DIAGNOSIS — D61818 Other pancytopenia: Secondary | ICD-10-CM

## 2022-12-12 LAB — CBC WITH DIFFERENTIAL/PLATELET
Abs Immature Granulocytes: 0.01 10*3/uL (ref 0.00–0.07)
Basophils Absolute: 0 10*3/uL (ref 0.0–0.1)
Basophils Relative: 1 %
Eosinophils Absolute: 0 10*3/uL (ref 0.0–0.5)
Eosinophils Relative: 1 %
HCT: 37.5 % (ref 36.0–46.0)
Hemoglobin: 12.5 g/dL (ref 12.0–15.0)
Immature Granulocytes: 0 %
Lymphocytes Relative: 54 %
Lymphs Abs: 1.7 10*3/uL (ref 0.7–4.0)
MCH: 33.1 pg (ref 26.0–34.0)
MCHC: 33.3 g/dL (ref 30.0–36.0)
MCV: 99.2 fL (ref 80.0–100.0)
Monocytes Absolute: 0.4 10*3/uL (ref 0.1–1.0)
Monocytes Relative: 11 %
Neutro Abs: 1 10*3/uL — ABNORMAL LOW (ref 1.7–7.7)
Neutrophils Relative %: 33 %
Platelets: 141 10*3/uL — ABNORMAL LOW (ref 150–400)
RBC: 3.78 MIL/uL — ABNORMAL LOW (ref 3.87–5.11)
RDW: 13.6 % (ref 11.5–15.5)
WBC: 3.1 10*3/uL — ABNORMAL LOW (ref 4.0–10.5)
nRBC: 0 % (ref 0.0–0.2)

## 2022-12-12 NOTE — Progress Notes (Signed)
Hematology/Oncology Consult note [facility]  [phone] [phone]  Patient Care Team: [facility] as PCP - General Jim Like, RN as Registered Nurse Scarlett Presto, RN (Inactive) as Registered Nurse   Name of the patient: Anna Lucas  [id number]  [birth year]   Date of visit: 12/12/22  Diagnosis- pancytopenia likely transient secondary to acute viral infection Macrocytic anemia possibly secondary to alcohol use  Chief complaint/ Reason for visit-routine follow-up of pancytopenia  Heme/Onc history: Patient is a 51 year old female presenting with abnormal LFTs secondary to acute hepatitis the cause of which was unclear.  Patient does have a history of chronic alcohol abuse but the acute liver failure was not attributed to it.  As far as her LFTs go her AST was elevated at 4577 and ALT 1859 on 05/10/2022 and presently at 2036 and 1017 respectively.  Bilirubin has remained stable to mildly increased at 2.6.  Hematology has been consulted for her pancytopenia.  Patient had a normal white cell count of 6.2 with an H&H of 14.7/43.7 with a platelet count of 93 on 05/10/2022.  Her white count has progressively gone down to 1.2 with an H&H of 11.4/34 and a platelet count of 37.  At baseline patient's platelet count fluctuated between 50s to 90s at least since 2016.  White cell count and hemoglobin has historically been normal.    Interval history-she has cut down on her alcohol intake and overall feels better.  Denies any acute complaints at this time  ECOG PS- 1 Pain scale- 0   Review of systems- Review of Systems  Constitutional:  Positive for malaise/fatigue. Negative for chills, fever and weight loss.  HENT:  Negative for congestion, ear discharge and nosebleeds.   Eyes:  Negative for blurred vision.  Respiratory:  Negative for cough, hemoptysis, sputum production, shortness of breath and wheezing.   Cardiovascular:   Negative for chest pain, palpitations, orthopnea and claudication.  Gastrointestinal:  Negative for abdominal pain, blood in stool, constipation, diarrhea, heartburn, melena, nausea and vomiting.  Genitourinary:  Negative for dysuria, flank pain, frequency, hematuria and urgency.  Musculoskeletal:  Negative for back pain, joint pain and myalgias.  Skin:  Negative for rash.  Neurological:  Negative for dizziness, tingling, focal weakness, seizures, weakness and headaches.  Endo/Heme/Allergies:  Does not bruise/bleed easily.  Psychiatric/Behavioral:  Negative for depression and suicidal ideas. The patient does not have insomnia.       Allergies  Allergen Reactions   Prednisone     Feet swelling      Past Medical History:  Diagnosis Date   Acid reflux    Alcohol abuse    Anxiety    Depression    Hypertension    Hypomagnesemia 05/12/2022   Hyponatremia 05/12/2022   Hypophosphatemia 05/12/2022   PTSD (post-traumatic stress disorder)      Past Surgical History:  Procedure Laterality Date   CESAREAN SECTION      Social History   Socioeconomic History   Marital status: Single    Spouse name: Not on file   Number of children: Not on file   Years of education: Not on file   Highest education level: Not on file  Occupational History   Not on file  Tobacco Use   Smoking status: Every Day    Packs/day: 1.00    Types: Cigarettes   Smokeless tobacco: Never  Substance and Sexual Activity   Alcohol use: Yes    Comment: twice a  week   Drug use: Never   Sexual activity: Not on file  Other Topics Concern   Not on file  Social History Narrative   ** Merged History Encounter **       Social Determinants of Health   Financial Resource Strain: Not on file  Food Insecurity: Not on file  Transportation Needs: Not on file  Physical Activity: Not on file  Stress: Not on file  Social Connections: Not on file  Intimate Partner Violence: Not on file    Family History   Problem Relation Age of Onset   CAD Paternal Grandfather    Stroke Paternal Grandfather    Diabetes Mellitus II Maternal Grandmother    Emphysema Maternal Grandmother      Current Outpatient Medications:    albuterol (PROVENTIL HFA;VENTOLIN HFA) 108 (90 Base) MCG/ACT inhaler, Inhale 2 puffs into the lungs every 6 (six) hours as needed for wheezing or shortness of breath., Disp: 1 Inhaler, Rfl: 0   amLODipine (NORVASC) 5 MG tablet, Take 1 tablet (5 mg total) by mouth daily., Disp: 30 tablet, Rfl: 2   citalopram (CELEXA) 40 MG tablet, Take 40 mg by mouth daily., Disp: , Rfl:    cyanocobalamin (VITAMIN B12) 1000 MCG/ML injection, Inject 1 mL (1,000 mcg total) into the muscle every 30 (thirty) days., Disp: 1 mL, Rfl: 10   gabapentin (NEURONTIN) 300 MG capsule, Take 1 capsule (300 mg total) by mouth 3 (three) times daily., Disp: , Rfl:    hydrOXYzine (ATARAX) 50 MG tablet, Take 1 tablet (50 mg total) by mouth 3 (three) times daily as needed., Disp: 30 tablet, Rfl: 0   lisinopril (ZESTRIL) 20 MG tablet, Take 20 mg by mouth every morning., Disp: , Rfl:    meloxicam (MOBIC) 7.5 MG tablet, Take 7.5 mg by mouth 2 (two) times daily., Disp: , Rfl:    omeprazole (PRILOSEC) 40 MG capsule, Take 40 mg by mouth daily., Disp: , Rfl:    Syringe/Needle, Disp, (SYRINGE 3CC/25GX1") 25G X 1" 3 ML MISC, 10 Syringes by Does not apply route as directed. Starting 10/14 weekly b12 injections for 4 weeks, then once monthy for 6 months, Disp: 1 each, Rfl: 11   traZODone (DESYREL) 50 MG tablet, Take 1 tablet (50 mg total) by mouth at bedtime., Disp: 30 tablet, Rfl: 1   Multiple Vitamin (MULTIVITAMIN WITH MINERALS) TABS tablet, Take 1 tablet by mouth daily. (Patient not taking: Reported on 12/12/2022), Disp: , Rfl:    thiamine 100 MG tablet, Take 1 tablet (100 mg total) by mouth daily. (Patient not taking: Reported on 12/12/2022), Disp: 30 tablet, Rfl: 2  Physical exam:  Vitals:   12/12/22 0926  BP: 125/87  Pulse: 76   Resp: 18  Temp: (!) 97.1 F (36.2 C)  TempSrc: Tympanic  SpO2: 99%  Weight: 162 lb (73.5 kg)  Height: 5' 5.5" (1.664 m)   Physical Exam Constitutional:      General: She is not in acute distress. Cardiovascular:     Rate and Rhythm: Normal rate and regular rhythm.     Heart sounds: Normal heart sounds.  Pulmonary:     Effort: Pulmonary effort is normal.     Breath sounds: Normal breath sounds.  Abdominal:     General: Bowel sounds are normal.     Palpations: Abdomen is soft.  Skin:    General: Skin is warm and dry.  Neurological:     Mental Status: She is alert and oriented to person, place, and time.  Latest Ref Rng & Units 06/25/2022    4:09 AM  CMP  Glucose 70 - 99 mg/dL 97   BUN 6 - 20 mg/dL 12   Creatinine 3.49 - 1.00 mg/dL 1.79   Sodium 150 - 569 mmol/L 133   Potassium 3.5 - 5.1 mmol/L 3.9   Chloride 98 - 111 mmol/L 97   CO2 22 - 32 mmol/L 27   Calcium 8.9 - 10.3 mg/dL 8.8       Latest Ref Rng & Units 12/12/2022    9:01 AM  CBC  WBC 4.0 - 10.5 K/uL 3.1   Hemoglobin 12.0 - 15.0 g/dL 79.4   Hematocrit 80.1 - 46.0 % 37.5   Platelets 150 - 400 K/uL 141      Assessment and plan- Patient is a 51 y.o. female here for routine follow-up of microcytic anemia and pancytopenia  Patient has mild waxing and waning leukopenia/neutropenia which I will continue to follow.  Hemoglobin has improved from 10.93 months ago presently to 12.5.  Mild chronic thrombocytopenia over the last 1 and half year likely secondary to alcohol intake.  Given the stability of her counts I will plan to get CBC ferritin and iron studies B12 and folate in 3 months in 6 months and see her back in 6 months   Visit Diagnosis 1. Pancytopenia (HCC)   2. Macrocytic anemia      Dr. Owens Shark, MD, MPH [facility] [phone] 12/12/2022 4:19 PM

## 2023-01-31 DIAGNOSIS — N951 Menopausal and female climacteric states: Secondary | ICD-10-CM | POA: Insufficient documentation

## 2023-02-24 ENCOUNTER — Other Ambulatory Visit: Payer: Self-pay | Admitting: Internal Medicine

## 2023-02-25 ENCOUNTER — Other Ambulatory Visit: Payer: Self-pay | Admitting: Oncology

## 2023-03-06 ENCOUNTER — Other Ambulatory Visit: Payer: Self-pay | Admitting: Internal Medicine

## 2023-03-14 ENCOUNTER — Inpatient Hospital Stay: Payer: BLUE CROSS/BLUE SHIELD | Attending: Obstetrics and Gynecology

## 2023-04-25 DIAGNOSIS — M170 Bilateral primary osteoarthritis of knee: Secondary | ICD-10-CM

## 2023-04-25 HISTORY — DX: Bilateral primary osteoarthritis of knee: M17.0

## 2023-05-16 DIAGNOSIS — F331 Major depressive disorder, recurrent, moderate: Secondary | ICD-10-CM | POA: Insufficient documentation

## 2023-06-14 ENCOUNTER — Encounter: Payer: Self-pay | Admitting: Oncology

## 2023-06-14 ENCOUNTER — Inpatient Hospital Stay: Payer: Medicaid Other | Admitting: Nurse Practitioner

## 2023-06-14 ENCOUNTER — Inpatient Hospital Stay: Payer: Medicaid Other | Attending: Oncology

## 2023-07-08 ENCOUNTER — Ambulatory Visit: Payer: BLUE CROSS/BLUE SHIELD | Admitting: Psychiatry

## 2023-07-16 ENCOUNTER — Encounter: Payer: Self-pay | Admitting: Oncology

## 2023-07-16 ENCOUNTER — Inpatient Hospital Stay: Payer: BLUE CROSS/BLUE SHIELD

## 2023-07-16 ENCOUNTER — Inpatient Hospital Stay: Payer: BLUE CROSS/BLUE SHIELD | Attending: Oncology

## 2023-07-16 ENCOUNTER — Inpatient Hospital Stay (HOSPITAL_BASED_OUTPATIENT_CLINIC_OR_DEPARTMENT_OTHER): Payer: BLUE CROSS/BLUE SHIELD | Admitting: Oncology

## 2023-07-16 VITALS — BP 105/87 | HR 61 | Temp 97.3°F | Resp 18 | Ht 65.5 in | Wt 141.3 lb

## 2023-07-16 DIAGNOSIS — D61818 Other pancytopenia: Secondary | ICD-10-CM

## 2023-07-16 DIAGNOSIS — D539 Nutritional anemia, unspecified: Secondary | ICD-10-CM

## 2023-07-16 LAB — CBC WITH DIFFERENTIAL/PLATELET
Abs Immature Granulocytes: 0.01 10*3/uL (ref 0.00–0.07)
Basophils Absolute: 0 10*3/uL (ref 0.0–0.1)
Basophils Relative: 1 %
Eosinophils Absolute: 0.1 10*3/uL (ref 0.0–0.5)
Eosinophils Relative: 1 %
HCT: 31.9 % — ABNORMAL LOW (ref 36.0–46.0)
Hemoglobin: 10.2 g/dL — ABNORMAL LOW (ref 12.0–15.0)
Immature Granulocytes: 0 %
Lymphocytes Relative: 51 %
Lymphs Abs: 1.8 10*3/uL (ref 0.7–4.0)
MCH: 31.9 pg (ref 26.0–34.0)
MCHC: 32 g/dL (ref 30.0–36.0)
MCV: 99.7 fL (ref 80.0–100.0)
Monocytes Absolute: 0.3 10*3/uL (ref 0.1–1.0)
Monocytes Relative: 7 %
Neutro Abs: 1.4 10*3/uL — ABNORMAL LOW (ref 1.7–7.7)
Neutrophils Relative %: 40 %
Platelets: 165 10*3/uL (ref 150–400)
RBC: 3.2 MIL/uL — ABNORMAL LOW (ref 3.87–5.11)
RDW: 13 % (ref 11.5–15.5)
WBC: 3.5 10*3/uL — ABNORMAL LOW (ref 4.0–10.5)
nRBC: 0 % (ref 0.0–0.2)

## 2023-07-16 LAB — IRON AND TIBC
Iron: 96 ug/dL (ref 28–170)
Saturation Ratios: 33 % — ABNORMAL HIGH (ref 10.4–31.8)
TIBC: 288 ug/dL (ref 250–450)
UIBC: 192 ug/dL

## 2023-07-16 LAB — FERRITIN: Ferritin: 253 ng/mL (ref 11–307)

## 2023-07-16 LAB — VITAMIN B12: Vitamin B-12: 534 pg/mL (ref 180–914)

## 2023-07-16 LAB — FOLATE: Folate: 7.8 ng/mL (ref >5.9)

## 2023-07-16 MED ORDER — "SYRINGE 25G X 1"" 3 ML MISC": 1.0000 | 0 refills | Status: DC

## 2023-07-16 MED ORDER — CYANOCOBALAMIN 1000 MCG/ML IJ SOLN
1000.0000 ug | Freq: Once | INTRAMUSCULAR | Status: AC
  Administered 2023-07-16: 1000 ug via INTRAMUSCULAR
  Filled 2023-07-16: qty 1

## 2023-07-21 NOTE — Progress Notes (Signed)
Hematology/Oncology Consult note [facility]  [phone] [phone]  Patient Care Team: [facility] as PCP - General Jim Like, RN as Registered Nurse Scarlett Presto, RN (Inactive) as Registered Nurse   Name of the patient: Anna Lucas  [id number]  [birth year]   Date of visit: 07/21/23  Diagnosis- pancytopenia likely transient secondary to acute viral infection Macrocytic anemia possibly secondary to alcohol use  Chief complaint/ Reason for visit- routine f/u of pancytopenia  Heme/Onc history: Patient is a 52 year old female presenting with abnormal LFTs secondary to acute hepatitis the cause of which was unclear.  Patient does have a history of chronic alcohol abuse but the acute liver failure was not attributed to it.  As far as her LFTs go her AST was elevated at 4577 and ALT 1859 on 05/10/2022 and presently at 2036 and 1017 respectively.  Bilirubin has remained stable to mildly increased at 2.6.  Hematology has been consulted for her pancytopenia.  Patient had a normal white cell count of 6.2 with an H&H of 14.7/43.7 with a platelet count of 93 on 05/10/2022.  Her white count has progressively gone down to 1.2 with an H&H of 11.4/34 and a platelet count of 37.  At baseline patient's platelet count fluctuated between 50s to 90s at least since 2016.  White cell count and hemoglobin has historically been normal.   Interval history- she continues to cut down on her alcohol intake  ECOG PS- 1 Pain scale- 0  Review of systems- Review of Systems  Constitutional:  Positive for malaise/fatigue. Negative for chills, fever and weight loss.  HENT:  Negative for congestion, ear discharge and nosebleeds.   Eyes:  Negative for blurred vision.  Respiratory:  Negative for cough, hemoptysis, sputum production, shortness of breath and wheezing.   Cardiovascular:  Negative for chest pain, palpitations, orthopnea and claudication.   Gastrointestinal:  Negative for abdominal pain, blood in stool, constipation, diarrhea, heartburn, melena, nausea and vomiting.  Genitourinary:  Negative for dysuria, flank pain, frequency, hematuria and urgency.  Musculoskeletal:  Negative for back pain, joint pain and myalgias.  Skin:  Negative for rash.  Neurological:  Negative for dizziness, tingling, focal weakness, seizures, weakness and headaches.  Endo/Heme/Allergies:  Does not bruise/bleed easily.  Psychiatric/Behavioral:  Negative for depression and suicidal ideas. The patient does not have insomnia.       Allergies  Allergen Reactions   Prednisone     Feet swelling      Past Medical History:  Diagnosis Date   Acid reflux    Alcohol abuse    Anxiety    Depression    Hypertension    Hypomagnesemia 05/12/2022   Hyponatremia 05/12/2022   Hypophosphatemia 05/12/2022   PTSD (post-traumatic stress disorder)      Past Surgical History:  Procedure Laterality Date   CESAREAN SECTION      Social History   Socioeconomic History   Marital status: Single    Spouse name: Not on file   Number of children: Not on file   Years of education: Not on file   Highest education level: Not on file  Occupational History   Not on file  Tobacco Use   Smoking status: Every Day    Current packs/day: 1.00    Types: Cigarettes   Smokeless tobacco: Never  Substance and Sexual Activity   Alcohol use: Yes    Comment: twice a week   Drug use: Never   Sexual activity:  Not on file  Other Topics Concern   Not on file  Social History Narrative   ** Merged History Encounter **       Social Determinants of Health   Financial Resource Strain: Not on file  Food Insecurity: Not on file  Transportation Needs: Not on file  Physical Activity: Not on file  Stress: Not on file  Social Connections: Not on file  Intimate Partner Violence: Not on file    Family History  Problem Relation Age of Onset   CAD Paternal Grandfather     Stroke Paternal Grandfather    Diabetes Mellitus II Maternal Grandmother    Emphysema Maternal Grandmother      Current Outpatient Medications:    albuterol (PROVENTIL HFA;VENTOLIN HFA) 108 (90 Base) MCG/ACT inhaler, Inhale 2 puffs into the lungs every 6 (six) hours as needed for wheezing or shortness of breath., Disp: 1 Inhaler, Rfl: 0   amLODipine (NORVASC) 5 MG tablet, Take 1 tablet (5 mg total) by mouth daily., Disp: 30 tablet, Rfl: 2   citalopram (CELEXA) 40 MG tablet, Take 40 mg by mouth daily., Disp: , Rfl:    cyanocobalamin (VITAMIN B12) 1000 MCG/ML injection, Inject 1 mL (1,000 mcg total) into the muscle every 30 (thirty) days., Disp: 1 mL, Rfl: 10   gabapentin (NEURONTIN) 300 MG capsule, Take 1 capsule (300 mg total) by mouth 3 (three) times daily., Disp: , Rfl:    hydrOXYzine (ATARAX) 50 MG tablet, Take 1 tablet (50 mg total) by mouth 3 (three) times daily as needed., Disp: 30 tablet, Rfl: 0   lisinopril (ZESTRIL) 20 MG tablet, Take 20 mg by mouth every morning., Disp: , Rfl:    meloxicam (MOBIC) 7.5 MG tablet, TAKE 1 TABLET BY MOUTH TWICE DAILY FOR 15 DAYS, Disp: 15 tablet, Rfl: 0   omeprazole (PRILOSEC) 40 MG capsule, Take 40 mg by mouth daily., Disp: , Rfl:    Syringe/Needle, Disp, (SYRINGE 3CC/25GX1") 25G X 1" 3 ML MISC, 1 Syringe by Does not apply route every 30 (thirty) days., Disp: 12 each, Rfl: 0   traZODone (DESYREL) 50 MG tablet, Take 1 tablet (50 mg total) by mouth at bedtime., Disp: 30 tablet, Rfl: 1   Multiple Vitamin (MULTIVITAMIN WITH MINERALS) TABS tablet, Take 1 tablet by mouth daily. (Patient not taking: Reported on 12/12/2022), Disp: , Rfl:    thiamine 100 MG tablet, Take 1 tablet (100 mg total) by mouth daily. (Patient not taking: Reported on 12/12/2022), Disp: 30 tablet, Rfl: 2  Physical exam:  Vitals:   07/16/23 1501  BP: 105/87  Pulse: 61  Resp: 18  Temp: (!) 97.3 F (36.3 C)  TempSrc: Tympanic  SpO2: 100%  Weight: 141 lb 4.8 oz (64.1 kg)  Height: 5'  5.5" (1.664 m)   Physical Exam Cardiovascular:     Rate and Rhythm: Normal rate and regular rhythm.     Heart sounds: Normal heart sounds.  Pulmonary:     Effort: Pulmonary effort is normal.     Breath sounds: Normal breath sounds.  Abdominal:     General: Bowel sounds are normal.     Palpations: Abdomen is soft.  Skin:    General: Skin is warm and dry.  Neurological:     Mental Status: She is alert and oriented to person, place, and time.         Latest Ref Rng & Units 06/25/2022    4:09 AM  CMP  Glucose 70 - 99 mg/dL 97   BUN 6 -  20 mg/dL 12   Creatinine 1.61 - 1.00 mg/dL 0.96   Sodium 045 - 409 mmol/L 133   Potassium 3.5 - 5.1 mmol/L 3.9   Chloride 98 - 111 mmol/L 97   CO2 22 - 32 mmol/L 27   Calcium 8.9 - 10.3 mg/dL 8.8       Latest Ref Rng & Units 07/16/2023    2:50 PM  CBC  WBC 4.0 - 10.5 K/uL 3.5   Hemoglobin 12.0 - 15.0 g/dL 81.1   Hematocrit 91.4 - 46.0 % 31.9   Platelets 150 - 400 K/uL 165     Assessment and plan- Patient is a 52 y.o. female here for routine f/u of pancytopenia  Patient has mild leukopenia with a white cell count that has remained stable between 3-4 for the last 1 year.  Platelets are normal at 165.  Her hemoglobin presently is 10.2.  Iron studies are normal folate and B12 is also normal.  I am inclined to monitor her anemia with repeat CBC in 3 months in 6 months and I will see her back in 6 months.  I will also check myeloma panel free light chains ferritin and iron studies again in 3 months.   Visit Diagnosis 1. Pancytopenia (HCC)      Dr. Owens Shark, MD, MPH [facility] [phone] 07/21/2023 3:17 PM

## 2023-08-12 NOTE — Progress Notes (Signed)
Psychiatric Initial Adult Assessment   Patient Identification: Anna Lucas MRN:  [id number] Date of Evaluation:  08/12/2023 Referral Source: *** Chief Complaint:  No chief complaint on file.  Visit Diagnosis: No diagnosis found.  History of Present Illness:   Anna Lucas is a 52 y.o. year old female with a history of depression, anxiety, alcohol use, hypertension, pancytopenia, likely transient secondary to active viral infection, who is referred for PTSD.   Citalopram 40 mg daily, gabaentin 300 mg tid, hydroxyzine 50 mg  Trazodone 50 mg  DT?       Associated Signs/Symptoms: Depression Symptoms:  {DEPRESSION SYMPTOMS:20000} (Hypo) Manic Symptoms:  {BHH MANIC SYMPTOMS:22872} Anxiety Symptoms:  {BHH ANXIETY SYMPTOMS:22873} Psychotic Symptoms:  {BHH PSYCHOTIC SYMPTOMS:22874} PTSD Symptoms: {BHH PTSD SYMPTOMS:22875}  Past Psychiatric History:  Outpatient:  Psychiatry admission:  Previous suicide attempt:  Past trials of medication:  History of violence:  History of head injury:   Previous Psychotropic Medications: {YES/NO:21197}  Substance Abuse History in the last 12 months:  {yes no:314532}  Consequences of Substance Abuse: {BHH CONSEQUENCES OF SUBSTANCE ABUSE:22880}  Past Medical History:  Past Medical History:  Diagnosis Date   Acid reflux    Alcohol abuse    Anxiety    Depression    Hypertension    Hypomagnesemia 05/12/2022   Hyponatremia 05/12/2022   Hypophosphatemia 05/12/2022   PTSD (post-traumatic stress disorder)     Past Surgical History:  Procedure Laterality Date   CESAREAN SECTION      Family Psychiatric History: ***  Family History:  Family History  Problem Relation Age of Onset   CAD Paternal Grandfather    Stroke Paternal Grandfather    Diabetes Mellitus II Maternal Grandmother    Emphysema Maternal Grandmother     Social History:   Social History   Socioeconomic History   Marital status: Single    Spouse name: Not on file    Number of children: Not on file   Years of education: Not on file   Highest education level: Not on file  Occupational History   Not on file  Tobacco Use   Smoking status: Every Day    Current packs/day: 1.00    Types: Cigarettes   Smokeless tobacco: Never  Substance and Sexual Activity   Alcohol use: Yes    Comment: twice a week   Drug use: Never   Sexual activity: Not on file  Other Topics Concern   Not on file  Social History Narrative   ** Merged History Encounter **       Social Determinants of Health   Financial Resource Strain: Not on file  Food Insecurity: Not on file  Transportation Needs: Not on file  Physical Activity: Not on file  Stress: Not on file  Social Connections: Not on file    Additional Social History: ***  Allergies:   Allergies  Allergen Reactions   Prednisone     Feet swelling     Metabolic Disorder Labs: Lab Results  Component Value Date   HGBA1C 5.1 06/20/2022   MPG 100 06/20/2022   No results found for: "PROLACTIN" Lab Results  Component Value Date   TRIG 98 06/21/2022   Lab Results  Component Value Date   TSH 1.234 09/05/2022    Therapeutic Level Labs: No results found for: "LITHIUM" No results found for: "CBMZ" No results found for: "VALPROATE"  Current Medications: Current Outpatient Medications  Medication Sig Dispense Refill   albuterol (PROVENTIL HFA;VENTOLIN HFA) 108 (90 Base) MCG/ACT inhaler  Inhale 2 puffs into the lungs every 6 (six) hours as needed for wheezing or shortness of breath. 1 Inhaler 0   amLODipine (NORVASC) 5 MG tablet Take 1 tablet (5 mg total) by mouth daily. 30 tablet 2   citalopram (CELEXA) 40 MG tablet Take 40 mg by mouth daily.     cyanocobalamin (VITAMIN B12) 1000 MCG/ML injection Inject 1 mL (1,000 mcg total) into the muscle every 30 (thirty) days. 1 mL 10   gabapentin (NEURONTIN) 300 MG capsule Take 1 capsule (300 mg total) by mouth 3 (three) times daily.     hydrOXYzine (ATARAX) 50 MG  tablet Take 1 tablet (50 mg total) by mouth 3 (three) times daily as needed. 30 tablet 0   lisinopril (ZESTRIL) 20 MG tablet Take 20 mg by mouth every morning.     meloxicam (MOBIC) 7.5 MG tablet TAKE 1 TABLET BY MOUTH TWICE DAILY FOR 15 DAYS 15 tablet 0   Multiple Vitamin (MULTIVITAMIN WITH MINERALS) TABS tablet Take 1 tablet by mouth daily. (Patient not taking: Reported on 12/12/2022)     omeprazole (PRILOSEC) 40 MG capsule Take 40 mg by mouth daily.     Syringe/Needle, Disp, (SYRINGE 3CC/25GX1") 25G X 1" 3 ML MISC 1 Syringe by Does not apply route every 30 (thirty) days. 12 each 0   thiamine 100 MG tablet Take 1 tablet (100 mg total) by mouth daily. (Patient not taking: Reported on 12/12/2022) 30 tablet 2   traZODone (DESYREL) 50 MG tablet Take 1 tablet (50 mg total) by mouth at bedtime. 30 tablet 1   No current facility-administered medications for this visit.    Musculoskeletal: Strength & Muscle Tone: within normal limits Gait & Station: normal Patient leans: N/A  Psychiatric Specialty Exam: Review of Systems  Last menstrual period 07/24/2016.There is no height or weight on file to calculate BMI.  General Appearance: {Appearance:22683}  Eye Contact:  {BHH EYE CONTACT:22684}  Speech:  Clear and Coherent  Volume:  Normal  Mood:  {BHH MOOD:22306}  Affect:  {Affect (PAA):22687}  Thought Process:  Coherent  Orientation:  Full (Time, Place, and Person)  Thought Content:  Logical  Suicidal Thoughts:  {ST/HT (PAA):22692}  Homicidal Thoughts:  {ST/HT (PAA):22692}  Memory:  Immediate;   Good  Judgement:  {Judgement (PAA):22694}  Insight:  {Insight (PAA):22695}  Psychomotor Activity:  Normal  Concentration:  Concentration: Good and Attention Span: Good  Recall:  Good  Fund of Knowledge:Good  Language: Good  Akathisia:   No  Handed:  Right  AIMS (if indicated):  not done  Assets:  Communication Skills Desire for Improvement  ADL's:  Intact  Cognition: WNL  Sleep:  {BHH  GOOD/FAIR/POOR:22877}   Screenings: PHQ2-9    Flowsheet Row Video Visit from 07/10/2022 in [facility] Video Visit from 07/03/2022 in [facility]  PHQ-2 Total Score 0 0      Flowsheet Row ED to Hosp-Admission (Discharged) from 06/16/2022 in [facility] 1C MEDICAL TELEMETRY ED to Hosp-Admission (Discharged) from 05/10/2022 in [facility]  C-SSRS RISK CATEGORY No Risk No Risk       Assessment and Plan:  Assessment  Plan   The patient demonstrates the following risk factors for suicide: Chronic risk factors for suicide include: {Chronic Risk Factors for WUJWJXB:14782956}. Acute risk factors for suicide include: {Acute Risk Factors for OZHYQMV:78469629}. Protective factors for this patient include: {Protective Factors for Suicide BMWU:13244010}. Considering these factors, the overall suicide risk at this point  appears to be {Desc; low/moderate/high:110033}. Patient {ACTION; IS/IS OVF:64332951} appropriate for outpatient follow up.   Collaboration of Care: {BH OP Collaboration of Care:21014065}  Patient/Guardian was advised Release of Information must be obtained prior to any record release in order to collaborate their care with an outside provider. Patient/Guardian was advised if they have not already done so to contact the registration department to sign all necessary forms in order for [place] to release information regarding their care.   Consent: Patient/Guardian gives verbal consent for treatment and assignment of benefits for services provided during this visit. Patient/Guardian expressed understanding and agreed to proceed.   Neysa Hotter, MD 8/19/20242:40 PM

## 2023-08-19 ENCOUNTER — Ambulatory Visit (INDEPENDENT_AMBULATORY_CARE_PROVIDER_SITE_OTHER): Payer: BLUE CROSS/BLUE SHIELD | Admitting: Psychiatry

## 2023-08-19 DIAGNOSIS — Z91199 Patient's noncompliance with other medical treatment and regimen due to unspecified reason: Secondary | ICD-10-CM

## 2023-10-16 ENCOUNTER — Inpatient Hospital Stay: Payer: BLUE CROSS/BLUE SHIELD | Attending: Oncology

## 2023-10-24 ENCOUNTER — Emergency Department: Payer: BLUE CROSS/BLUE SHIELD

## 2023-10-24 ENCOUNTER — Emergency Department
Admission: EM | Admit: 2023-10-24 | Discharge: 2023-10-24 | Disposition: A | Discharge status: Home / Self Care | Payer: BLUE CROSS/BLUE SHIELD | Attending: Emergency Medicine | Admitting: Emergency Medicine

## 2023-10-24 DIAGNOSIS — I10 Essential (primary) hypertension: Secondary | ICD-10-CM | POA: Diagnosis not present

## 2023-10-24 DIAGNOSIS — I959 Hypotension, unspecified: Secondary | ICD-10-CM | POA: Insufficient documentation

## 2023-10-24 DIAGNOSIS — R1084 Generalized abdominal pain: Secondary | ICD-10-CM | POA: Insufficient documentation

## 2023-10-24 DIAGNOSIS — R55 Syncope and collapse: Secondary | ICD-10-CM | POA: Diagnosis present

## 2023-10-24 DIAGNOSIS — Z20822 Contact with and (suspected) exposure to covid-19: Secondary | ICD-10-CM | POA: Diagnosis not present

## 2023-10-24 LAB — URINALYSIS, W/ REFLEX TO CULTURE (INFECTION SUSPECTED)
Bilirubin Urine: NEGATIVE
Glucose, UA: NEGATIVE mg/dL
Ketones, ur: NEGATIVE mg/dL
Leukocytes,Ua: NEGATIVE
Nitrite: NEGATIVE
Protein, ur: NEGATIVE mg/dL
Specific Gravity, Urine: 1.004 — ABNORMAL LOW (ref 1.005–1.030)
pH: 6 (ref 5.0–8.0)

## 2023-10-24 LAB — CBC WITH DIFFERENTIAL/PLATELET
Abs Immature Granulocytes: 0.07 10*3/uL (ref 0.00–0.07)
Basophils Absolute: 0 10*3/uL (ref 0.0–0.1)
Basophils Relative: 0 %
Eosinophils Absolute: 0 10*3/uL (ref 0.0–0.5)
Eosinophils Relative: 0 %
HCT: 37.5 % (ref 36.0–46.0)
Hemoglobin: 12.2 g/dL (ref 12.0–15.0)
Immature Granulocytes: 1 %
Lymphocytes Relative: 9 %
Lymphs Abs: 1.2 10*3/uL (ref 0.7–4.0)
MCH: 32.4 pg (ref 26.0–34.0)
MCHC: 32.5 g/dL (ref 30.0–36.0)
MCV: 99.7 fL (ref 80.0–100.0)
Monocytes Absolute: 0.9 10*3/uL (ref 0.1–1.0)
Monocytes Relative: 6 %
Neutro Abs: 11.7 10*3/uL — ABNORMAL HIGH (ref 1.7–7.7)
Neutrophils Relative %: 84 %
Platelets: 211 10*3/uL (ref 150–400)
RBC: 3.76 MIL/uL — ABNORMAL LOW (ref 3.87–5.11)
RDW: 15 % (ref 11.5–15.5)
WBC: 13.9 10*3/uL — ABNORMAL HIGH (ref 4.0–10.5)
nRBC: 0 % (ref 0.0–0.2)

## 2023-10-24 LAB — COMPREHENSIVE METABOLIC PANEL
ALT: 24 U/L (ref 0–44)
AST: 20 U/L (ref 15–41)
Albumin: 4.6 g/dL (ref 3.5–5.0)
Alkaline Phosphatase: 86 U/L (ref 38–126)
Anion gap: 13 (ref 5–15)
BUN: 12 mg/dL (ref 6–20)
CO2: 24 mmol/L (ref 22–32)
Calcium: 9.2 mg/dL (ref 8.9–10.3)
Chloride: 101 mmol/L (ref 98–111)
Creatinine, Ser: 1 mg/dL (ref 0.44–1.00)
GFR, Estimated: >60 mL/min (ref >60)
Glucose, Bld: 92 mg/dL (ref 70–99)
Potassium: 3.9 mmol/L (ref 3.5–5.1)
Sodium: 138 mmol/L (ref 135–145)
Total Bilirubin: 0.7 mg/dL (ref 0.3–1.2)
Total Protein: 8.2 g/dL — ABNORMAL HIGH (ref 6.5–8.1)

## 2023-10-24 LAB — RESP PANEL BY RT-PCR (RSV, FLU A&B, COVID)  RVPGX2
Influenza A by PCR: NEGATIVE
Influenza B by PCR: NEGATIVE
Resp Syncytial Virus by PCR: NEGATIVE
SARS Coronavirus 2 by RT PCR: NEGATIVE

## 2023-10-24 LAB — SALICYLATE LEVEL: Salicylate Lvl: <7 mg/dL — ABNORMAL LOW (ref 7.0–30.0)

## 2023-10-24 LAB — URINE DRUG SCREEN, QUALITATIVE (ARMC ONLY)
Amphetamines, Ur Screen: NOT DETECTED
Barbiturates, Ur Screen: NOT DETECTED
Benzodiazepine, Ur Scrn: NOT DETECTED
Cannabinoid 50 Ng, Ur ~~LOC~~: NOT DETECTED
Cocaine Metabolite,Ur ~~LOC~~: NOT DETECTED
MDMA (Ecstasy)Ur Screen: NOT DETECTED
Methadone Scn, Ur: NOT DETECTED
Opiate, Ur Screen: NOT DETECTED
Phencyclidine (PCP) Ur S: NOT DETECTED
Tricyclic, Ur Screen: NOT DETECTED

## 2023-10-24 LAB — PROTIME-INR
INR: 1 (ref 0.8–1.2)
Prothrombin Time: 13.4 s (ref 11.4–15.2)

## 2023-10-24 LAB — LACTIC ACID, PLASMA
Lactic Acid, Venous: 2.2 mmol/L (ref 0.5–1.9)
Lactic Acid, Venous: 1.8 mmol/L (ref 0.5–1.9)

## 2023-10-24 LAB — ACETAMINOPHEN LEVEL: Acetaminophen (Tylenol), Serum: <10 ug/mL — ABNORMAL LOW (ref 10–30)

## 2023-10-24 LAB — APTT: aPTT: 28 s (ref 24–36)

## 2023-10-24 MED ORDER — ACETAMINOPHEN 500 MG PO TABS
1000.0000 mg | ORAL_TABLET | Freq: Once | ORAL | Status: AC
  Administered 2023-10-24: 1000 mg via ORAL
  Filled 2023-10-24: qty 2

## 2023-10-24 MED ORDER — SODIUM CHLORIDE 0.9 % IV BOLUS (SEPSIS)
1000.0000 mL | Freq: Once | INTRAVENOUS | Status: AC
  Administered 2023-10-24: 1000 mL via INTRAVENOUS

## 2023-10-24 MED ORDER — LACTATED RINGERS IV SOLN: INTRAVENOUS | Status: DC

## 2023-10-24 MED ORDER — SODIUM CHLORIDE 0.9 % IV SOLN
2.0000 g | INTRAVENOUS | Status: DC
  Administered 2023-10-24: 2 g via INTRAVENOUS
  Filled 2023-10-24: qty 20

## 2023-10-24 NOTE — ED Notes (Addendum)
Nurse in room with pt aware of Evaluated HR 100

## 2023-10-24 NOTE — ED Notes (Signed)
Provider notified about unable to get 2nd set of blood cultures. Provider ok with not drawing the 2nd set and not starting the LR fluids.

## 2023-10-24 NOTE — ED Notes (Signed)
Pt up to the bedside commode to provide urine sample. Pt back to bed and monitor is on. Call light within reach.

## 2023-10-24 NOTE — ED Triage Notes (Signed)
Pt BIBA from [facility]. Pt had a near syncopal episode with BP 80/60. Unclear why she was checking into [facility]. Pt stating "I have to go to work tomorrow. I'll be fine." Provider at bedside. CBG 137.

## 2023-10-24 NOTE — Sepsis Progress Note (Signed)
Sepsis protocol monitored by eLink ?

## 2023-10-24 NOTE — ED Notes (Signed)
Unable to get IV started. Provider notified. Moved [place] to bedside. Provider will do [place] IV.

## 2023-10-24 NOTE — ED Provider Notes (Signed)
[facility] Provider Note   Event Date/Time   First MD Initiated Contact with Patient 10/24/23 1525     (approximate) History  Abdominal Pain and Near Syncope  HPI Anna Lucas is a 52 y.o. female with a stated past medical history of hypertension, depression Celexa anxiety, PTSD, alcohol abuse, and obesity who presents via EMS from [facility] for presyncopal symptoms as well as hypotension.  Patient denies any complaints other than chronic headaches as well as abdominal pain that started today the patient states is because she is not "pooping correctly".  Patient arrives stating "I have to go to work tomorrow" ROS: Patient currently denies any vision changes, tinnitus, difficulty speaking, facial droop, sore throat, chest pain, shortness of breath, nausea/vomiting/diarrhea, dysuria, or weakness/numbness/paresthesias in any extremity   Physical Exam  Triage Vital Signs: ED Triage Vitals  Encounter Vitals Group     BP      Systolic BP Percentile      Diastolic BP Percentile      Pulse      Resp      Temp      Temp src      SpO2      Weight      Height      Head Circumference      Peak Flow      Pain Score      Pain Loc      Pain Education      Exclude from Growth Chart    Most recent vital signs: Vitals:   10/24/23 2000 10/24/23 2030  BP: 112/72 94/69  Pulse: 92 92  Resp: (!) 21 16  Temp:    SpO2: 96% 93%   General: Awake, oriented x4. CV:  Good peripheral perfusion.  Resp:  Normal effort.  Abd:  No distention.  Other:  Middle-aged obese Caucasian female resting comfortably in no acute distress ED Results / Procedures / Treatments  Labs (all labs ordered are listed, but only abnormal results are displayed) Labs Reviewed  LACTIC ACID, PLASMA - Abnormal; Notable for the following components:      Result Value   Lactic Acid, Venous 2.2 (*)    All other components within normal limits  COMPREHENSIVE METABOLIC PANEL - Abnormal; Notable for the  following components:   Total Protein 8.2 (*)    All other components within normal limits  CBC WITH DIFFERENTIAL/PLATELET - Abnormal; Notable for the following components:   WBC 13.9 (*)    RBC 3.76 (*)    Neutro Abs 11.7 (*)    All other components within normal limits  BLOOD GAS, VENOUS - Abnormal; Notable for the following components:   pO2, Ven <31 (*)    All other components within normal limits  URINALYSIS, W/ REFLEX TO CULTURE (INFECTION SUSPECTED) - Abnormal; Notable for the following components:   Color, Urine STRAW (*)    APPearance CLEAR (*)    Specific Gravity, Urine 1.004 (*)    Hgb urine dipstick SMALL (*)    Bacteria, UA RARE (*)    All other components within normal limits  ACETAMINOPHEN LEVEL - Abnormal; Notable for the following components:   Acetaminophen (Tylenol), Serum <10 (*)    All other components within normal limits  SALICYLATE LEVEL - Abnormal; Notable for the following components:   Salicylate Lvl <7.0 (*)    All other components within normal limits  RESP PANEL BY RT-PCR (RSV, FLU A&B, COVID)  RVPGX2  CULTURE, BLOOD (ROUTINE X  2)  CULTURE, BLOOD (ROUTINE X 2)  LACTIC ACID, PLASMA  PROTIME-INR  APTT  URINE DRUG SCREEN, QUALITATIVE ([facility] ONLY)  ETHANOL   EKG ED ECG REPORT I, Merwyn Katos, the attending physician, personally viewed and interpreted this ECG. Date: 10/24/2023 EKG Time: 1528 Rate: 95 Rhythm: normal sinus rhythm QRS Axis: normal Intervals: normal ST/T Wave abnormalities: normal Narrative Interpretation: no evidence of acute ischemia RADIOLOGY ED MD interpretation: One-view portable chest x-ray interpreted by me shows no evidence of acute abnormalities including no pneumonia, pneumothorax, or widened mediastinum -Agree with radiology assessment Official radiology report(s): DG Chest Port 1 View  Result Date: 10/24/2023 CLINICAL DATA:  Sepsis. EXAM: PORTABLE CHEST 1 VIEW COMPARISON:  June 20, 2022. FINDINGS: The heart size  and mediastinal contours are within normal limits. Both lungs are clear. The visualized skeletal structures are unremarkable. IMPRESSION: No active disease. Electronically Signed   By: Lupita Raider M.D.   On: 10/24/2023 17:20   PROCEDURES: Critical Care performed: No .1-3 Lead EKG Interpretation  Performed by: Merwyn Katos, MD Authorized by: Merwyn Katos, MD     Interpretation: normal     ECG rate:  88   ECG rate assessment: normal     Rhythm: sinus rhythm     Ectopy: none     Conduction: normal    MEDICATIONS ORDERED IN ED: Medications  cefTRIAXone (ROCEPHIN) 2 g in sodium chloride 0.9 % 100 mL IVPB (0 g Intravenous Stopped 10/24/23 1720)  sodium chloride 0.9 % bolus 1,000 mL (0 mLs Intravenous Stopped 10/24/23 1812)    And  sodium chloride 0.9 % bolus 1,000 mL (0 mLs Intravenous Stopped 10/24/23 1812)  acetaminophen (TYLENOL) tablet 1,000 mg (1,000 mg Oral Given 10/24/23 1950)   IMPRESSION / MDM / ASSESSMENT AND PLAN / ED COURSE  I reviewed the triage vital signs and the nursing notes.                             The patient is on the cardiac monitor to evaluate for evidence of arrhythmia and/or significant heart rate changes. Patient's presentation is most consistent with acute presentation with potential threat to life or bodily function. Patient's symptoms not typical for emergent causes of abdominal pain such as, but not limited to, appendicitis, abdominal aortic aneurysm, surgical biliary disease, pancreatitis, SBO, mesenteric ischemia, serious intra-abdominal bacterial illness. Presentation also not typical of gynecologic emergencies such as TOA, Ovarian Torsion, PID. Not Ectopic. Doubt atypical ACS.  Pt tolerating PO. Disposition: Patient will be discharged with strict return precautions and follow up with primary MD within 12-24 hours for further evaluation. Patient understands that this still may have an early presentation of an emergent medical condition such as  appendicitis that will require a recheck.   FINAL CLINICAL IMPRESSION(S) / ED DIAGNOSES   Final diagnoses:  Generalized abdominal pain  Hypotension, unspecified hypotension type   Rx / DC Orders   ED Discharge Orders     None      Note:  This document was prepared using Dragon voice recognition software and may include unintentional dictation errors.   Merwyn Katos, MD 10/24/23 2150

## 2023-10-25 LAB — BLOOD GAS, VENOUS
Acid-base deficit: 0.8 mmol/L (ref 0.0–2.0)
Bicarbonate: 26.2 mmol/L (ref 20.0–28.0)
O2 Saturation: 24.3 %
Patient temperature: 37
pCO2, Ven: 52 mm[Hg] (ref 44–60)
pH, Ven: 7.31 (ref 7.25–7.43)

## 2023-10-29 LAB — CULTURE, BLOOD (ROUTINE X 2): Culture: NO GROWTH

## 2023-12-11 ENCOUNTER — Telehealth: Payer: Self-pay | Admitting: Oncology

## 2023-12-11 NOTE — Telephone Encounter (Signed)
Please look into this

## 2023-12-11 NOTE — Telephone Encounter (Signed)
Pt needs syringes for b12, she stated she is due for them and wants to be able to pick this up before they close on Friday.  Pharmacy is at [facility] on [address] phone number is [phone]

## 2023-12-17 ENCOUNTER — Encounter: Payer: Self-pay | Admitting: *Deleted

## 2023-12-19 ENCOUNTER — Encounter: Payer: Self-pay | Admitting: *Deleted

## 2023-12-19 ENCOUNTER — Telehealth: Payer: Self-pay | Admitting: *Deleted

## 2023-12-19 NOTE — Telephone Encounter (Signed)
Called several times and I am to early or too late or the holidays . I spoke to pharmacy and they asked me to fax it and I faxed it to [phone]. The fax went through.

## 2024-01-17 ENCOUNTER — Ambulatory Visit: Payer: Self-pay

## 2024-01-17 NOTE — Telephone Encounter (Addendum)
  Chief Complaint: pain to right lateral thight with 2 knots Symptoms: knots present for years but started  hurting in past month Frequency: see above Pertinent Negatives: Patient denies redness or warmth to the area of pain  Disposition: [] ED /[x] Urgent Care (no appt availability in office) / [] Appointment(In office/virtual)/ []  [place] [facility]/ [] Home Care/ [] Refused Recommended Disposition /[] [facility]/ []  Follow-up with PCP Additional Notes: Pt no longer wants to go back to her previous practice. Made NP appt with Dr Evelene Croon for NP appt. Advised pt to go to UC. Reset password for MyChart. Reason for Disposition  [1] MODERATE pain (e.g., interferes with normal activities, limping) AND [2] present > 3 days  Answer Assessment - Initial Assessment Questions 1. ONSET: "When did the pain start?"      Knots years  but hurting for 1 month 2. LOCATION: "Where is the pain located?"      Side of thigh 2 knots 3. PAIN: "How bad is the pain?"    (Scale 1-10; or mild, moderate, severe)   -  MILD (1-3): doesn't interfere with normal activities    -  MODERATE (4-7): interferes with normal activities (e.g., work or school) or awakens from sleep, limping    -  SEVERE (8-10): excruciating pain, unable to do any normal activities, unable to walk     Side of thigh 7-8 at night  goes down toes  4. WORK OR EXERCISE: "Has there been any recent work or exercise that involved this part of the body?"      no 5. CAUSE: "What do you think is causing the leg pain?"     unknown 6. OTHER SYMPTOMS: "Do you have any other symptoms?" (e.g., chest pain, back pain, breathing difficulty, swelling, rash, fever, numbness, weakness)     Knots, sciatica, right lateral thigh pain 7. PREGNANCY: "Is there any chance you are pregnant?" "When was your last menstrual period?"     N/a  Protocols used: Leg Pain-A-AH

## 2024-01-21 ENCOUNTER — Inpatient Hospital Stay: Payer: BLUE CROSS/BLUE SHIELD | Admitting: Oncology

## 2024-01-21 ENCOUNTER — Inpatient Hospital Stay: Payer: BLUE CROSS/BLUE SHIELD

## 2024-01-27 ENCOUNTER — Other Ambulatory Visit: Payer: Self-pay | Admitting: *Deleted

## 2024-01-27 DIAGNOSIS — D539 Nutritional anemia, unspecified: Secondary | ICD-10-CM

## 2024-01-27 NOTE — Progress Notes (Signed)
 c

## 2024-01-28 ENCOUNTER — Inpatient Hospital Stay: Payer: BLUE CROSS/BLUE SHIELD | Admitting: Oncology

## 2024-01-28 ENCOUNTER — Encounter: Payer: Self-pay | Admitting: Oncology

## 2024-01-28 ENCOUNTER — Inpatient Hospital Stay: Payer: BLUE CROSS/BLUE SHIELD | Attending: Program of All-Inclusive Care for the Elderly (PACE) Provider Organization

## 2024-03-11 ENCOUNTER — Ambulatory Visit: Payer: MEDICAID | Admitting: Pediatrics

## 2024-03-11 ENCOUNTER — Encounter: Payer: Self-pay | Admitting: Pediatrics

## 2024-03-11 VITALS — BP 78/46 | HR 85 | Temp 98.7°F | Resp 17 | Ht 65.08 in | Wt 131.2 lb

## 2024-03-11 DIAGNOSIS — F1010 Alcohol abuse, uncomplicated: Secondary | ICD-10-CM | POA: Diagnosis not present

## 2024-03-11 DIAGNOSIS — J449 Chronic obstructive pulmonary disease, unspecified: Secondary | ICD-10-CM | POA: Diagnosis not present

## 2024-03-11 DIAGNOSIS — N951 Menopausal and female climacteric states: Secondary | ICD-10-CM | POA: Diagnosis not present

## 2024-03-11 DIAGNOSIS — L219 Seborrheic dermatitis, unspecified: Secondary | ICD-10-CM

## 2024-03-11 DIAGNOSIS — R42 Dizziness and giddiness: Secondary | ICD-10-CM

## 2024-03-11 DIAGNOSIS — Z1330 Encounter for screening examination for mental health and behavioral disorders, unspecified: Secondary | ICD-10-CM

## 2024-03-11 DIAGNOSIS — Z7689 Persons encountering health services in other specified circumstances: Secondary | ICD-10-CM

## 2024-03-11 DIAGNOSIS — Z131 Encounter for screening for diabetes mellitus: Secondary | ICD-10-CM

## 2024-03-11 DIAGNOSIS — Z13220 Encounter for screening for lipoid disorders: Secondary | ICD-10-CM

## 2024-03-11 DIAGNOSIS — I10 Essential (primary) hypertension: Secondary | ICD-10-CM

## 2024-03-11 DIAGNOSIS — Z862 Personal history of diseases of the blood and blood-forming organs and certain disorders involving the immune mechanism: Secondary | ICD-10-CM | POA: Insufficient documentation

## 2024-03-11 MED ORDER — ALBUTEROL SULFATE HFA 108 (90 BASE) MCG/ACT IN AERS: 2.0000 | INHALATION_SPRAY | Freq: Four times a day (QID) | RESPIRATORY_TRACT | 0 refills | Status: DC | PRN

## 2024-03-11 MED ORDER — NALTREXONE HCL 50 MG PO TABS: ORAL_TABLET | ORAL | 0 refills | Status: DC

## 2024-03-11 MED ORDER — KETOCONAZOLE 1 % EX SHAM: MEDICATED_SHAMPOO | CUTANEOUS | 0 refills | Status: DC

## 2024-03-11 MED ORDER — SYRINGE 25G X 1" 3 ML MISC: 1.0000 | 0 refills | Status: DC

## 2024-03-11 NOTE — Patient Instructions (Addendum)
 Stop lisinopril - your blood pressure was too low  For alcohol: - start naltrexone 1/2 tab for 7 days then take the full tab - this will help reduce cravings  For your breathing: - I gave you a breztri twice day inhaler - albuterol sent for rescue   I sent the needles to your pharmacy as well  Good to meet you! Welcome to [facility]!  As your primary care doctor, I look forward to working with you to help you reach your health goals.  Please be aware of a couple of logistical items: - If you message me on mychart, it may take me 1-2 business days to get back to you. This is for non-urgent messaging.  - If you require urgent clinical attention, please call the clinic or present to urgent care/emergency room - If you have labs, I typically will send a message about them in 1-2 business days. - I am not here on Mondays, otherwise will be available from Tuesday-Friday during 8a-5pm.
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
## 2024-03-12 ENCOUNTER — Other Ambulatory Visit: Payer: Self-pay | Admitting: Pediatrics

## 2024-03-12 DIAGNOSIS — F1010 Alcohol abuse, uncomplicated: Secondary | ICD-10-CM

## 2024-03-12 DIAGNOSIS — Z862 Personal history of diseases of the blood and blood-forming organs and certain disorders involving the immune mechanism: Secondary | ICD-10-CM

## 2024-03-12 LAB — CBC WITH DIFFERENTIAL/PLATELET
Basophils Absolute: 0 10*3/uL (ref 0.0–0.2)
Basos: 0 %
EOS (ABSOLUTE): 0.1 10*3/uL (ref 0.0–0.4)
Eos: 2 %
Hematocrit: 30.6 % — ABNORMAL LOW (ref 34.0–46.6)
Hemoglobin: 10.5 g/dL — ABNORMAL LOW (ref 11.1–15.9)
Immature Grans (Abs): 0 10*3/uL (ref 0.0–0.1)
Immature Granulocytes: 0 %
Lymphocytes Absolute: 1.7 10*3/uL (ref 0.7–3.1)
Lymphs: 53 %
MCH: 32.8 pg (ref 26.6–33.0)
MCHC: 34.3 g/dL (ref 31.5–35.7)
MCV: 96 fL (ref 79–97)
Monocytes Absolute: 0.2 10*3/uL (ref 0.1–0.9)
Monocytes: 6 %
Neutrophils Absolute: 1.2 10*3/uL — ABNORMAL LOW (ref 1.4–7.0)
Neutrophils: 39 %
Platelets: 115 10*3/uL — ABNORMAL LOW (ref 150–450)
RBC: 3.2 x10E6/uL — ABNORMAL LOW (ref 3.77–5.28)
RDW: 14.2 % (ref 11.7–15.4)
WBC: 3.2 10*3/uL — ABNORMAL LOW (ref 3.4–10.8)

## 2024-03-12 LAB — COMPREHENSIVE METABOLIC PANEL
ALT: 44 IU/L — ABNORMAL HIGH (ref 0–32)
AST: 90 IU/L — ABNORMAL HIGH (ref 0–40)
Albumin: 4 g/dL (ref 3.9–4.9)
Alkaline Phosphatase: 149 IU/L — ABNORMAL HIGH (ref 44–121)
BUN/Creatinine Ratio: 11 (ref 9–23)
BUN: 12 mg/dL (ref 6–24)
Bilirubin Total: 0.3 mg/dL (ref 0.0–1.2)
CO2: 24 mmol/L (ref 20–29)
Calcium: 8.3 mg/dL — ABNORMAL LOW (ref 8.7–10.2)
Chloride: 89 mmol/L — ABNORMAL LOW (ref 96–106)
Creatinine, Ser: 1.11 mg/dL — ABNORMAL HIGH (ref 0.57–1.00)
Globulin, Total: 2.2 g/dL (ref 1.5–4.5)
Glucose: 78 mg/dL (ref 70–99)
Potassium: 3.9 mmol/L (ref 3.5–5.2)
Sodium: 129 mmol/L — ABNORMAL LOW (ref 134–144)
Total Protein: 6.2 g/dL (ref 6.0–8.5)
eGFR: 61 mL/min/{1.73_m2} (ref >59)

## 2024-03-12 LAB — LIPID PANEL
Chol/HDL Ratio: 2.2 ratio (ref 0.0–4.4)
Cholesterol, Total: 227 mg/dL — ABNORMAL HIGH (ref 100–199)
HDL: 105 mg/dL (ref >39)
LDL Chol Calc (NIH): 102 mg/dL — ABNORMAL HIGH (ref 0–99)
Triglycerides: 120 mg/dL (ref 0–149)
VLDL Cholesterol Cal: 20 mg/dL (ref 5–40)

## 2024-03-12 LAB — THYROID PANEL WITH TSH
Free Thyroxine Index: 1.6 (ref 1.2–4.9)
T3 Uptake Ratio: 27 % (ref 24–39)
T4, Total: 6.1 ug/dL (ref 4.5–12.0)
TSH: 1.05 u[IU]/mL (ref 0.450–4.500)

## 2024-03-12 LAB — HEMOGLOBIN A1C
Est. average glucose Bld gHb Est-mCnc: 97 mg/dL
Hgb A1c MFr Bld: 5 % (ref 4.8–5.6)

## 2024-03-12 NOTE — Telephone Encounter (Signed)
 Copied from CRM [id number]. Topic: Clinical - Medication Refill >> Mar 12, 2024  4:11 PM Geneva B wrote: Most Recent Primary Care Visit:  Provider: Jackolyn Confer  Department: [facility]  Visit Type: NEW PATIENT  Date: 03/11/2024  Medication: Syringe/Needle, Disp, (SYRINGE 3CC/25GX1") 25G X 1" 3 ML MISC  naltrexone (DEPADE) 50 MG tablet    Has the patient contacted their pharmacy? Yes (Agent: If no, request that the patient contact the pharmacy for the refill. If patient does not wish to contact the pharmacy document the reason why and proceed with request.) (Agent: If yes, when and what did the pharmacy advise?)  Is this the correct pharmacy for this prescription? Yes If no, delete pharmacy and type the correct one.  This is the patient's preferred pharmacy:  [facility] [address] [place], [place], [place] [address] or call [phone].   Has the prescription been filled recently? Yes  Is the patient out of the medication? Yes  Has the patient been seen for an appointment in the last year OR does the patient have an upcoming appointment? Yes  Can we respond through MyChart? No  Agent: Please be advised that Rx refills may take up to 3 business days. We ask that you follow-up with your pharmacy.

## 2024-03-12 NOTE — Telephone Encounter (Signed)
 Requested medication (s) are due for refill today: Yes  Requested medication (s) are on the active medication list: Yes  Last refill:  03/11/24  Future visit scheduled: Yes  Notes to clinic:  Unable to refill per protocol, cannot delegate, no protocol. Resend to a different pharmacy.     Requested Prescriptions  Pending Prescriptions Disp Refills   naltrexone (DEPADE) 50 MG tablet 25 tablet 0    Sig: Take 0.5 tablets (25 mg total) by mouth daily for 7 days, THEN 1 tablet (50 mg total) daily for 21 days.     Not Delegated - Psychiatry: Drug Dependence Therapy - naltrexone Failed - 03/12/2024  4:49 PM      Failed - This refill cannot be delegated      Failed - Valid encounter within last 6 months    Recent Outpatient Visits   None            Passed - Completed PHQ-2 or PHQ-9 in the last 360 days       Syringe/Needle, Disp, (SYRINGE 3CC/25GX1") 25G X 1" 3 ML MISC 12 each 0    Sig: 1 Syringe by Does not apply route every 30 (thirty) days.     There is no refill protocol information for this order

## 2024-03-13 ENCOUNTER — Other Ambulatory Visit: Payer: Self-pay | Admitting: Pediatrics

## 2024-03-13 DIAGNOSIS — J449 Chronic obstructive pulmonary disease, unspecified: Secondary | ICD-10-CM

## 2024-03-13 DIAGNOSIS — F1010 Alcohol abuse, uncomplicated: Secondary | ICD-10-CM

## 2024-03-13 DIAGNOSIS — Z862 Personal history of diseases of the blood and blood-forming organs and certain disorders involving the immune mechanism: Secondary | ICD-10-CM

## 2024-03-13 NOTE — Telephone Encounter (Signed)
 Copied from CRM [id number]. Topic: Clinical - Medication Refill >> Mar 13, 2024  9:31 AM Higinio Roger wrote: Most Recent Primary Care Visit:  Provider: Jackolyn Confer  Department: [facility]  Visit Type: NEW PATIENT  Date: 03/11/2024  Medication: naltrexone (DEPADE) 50 MG tablet  albuterol (VENTOLIN HFA) 108 (90 Base) MCG/ACT inhaler  Syringe/Needle, Disp, (SYRINGE 3CC/25GX1") 25G X 1" 3 ML MISC   Has the patient contacted their pharmacy? Yes (Agent: If no, request that the patient contact the pharmacy for the refill. If patient does not wish to contact the pharmacy document the reason why and proceed with request.) (Agent: If yes, when and what did the pharmacy advise?) Have not received it and contact Dr.  Is this the correct pharmacy for this prescription? Yes If no, delete pharmacy and type the correct one.  This is the patient's preferred pharmacy:   [facility] [address] [place] - [address] [place]) [place] [address] Phone: [phone] Fax: [phone]  Has the prescription been filled recently? Yes  Is the patient out of the medication? Yes  Has the patient been seen for an appointment in the last year OR does the patient have an upcoming appointment? Yes  Can we respond through MyChart? Yes  Agent: Please be advised that Rx refills may take up to 3 business days. We ask that you follow-up with your pharmacy.

## 2024-03-16 NOTE — Progress Notes (Signed)
 Appt scheduled

## 2024-03-17 MED ORDER — SYRINGE 25G X 1" 3 ML MISC: 1.0000 | 0 refills | Status: AC

## 2024-03-17 MED ORDER — NALTREXONE HCL 50 MG PO TABS
ORAL_TABLET | ORAL | 0 refills | Status: AC
Start: 2024-03-17 — End: 2024-04-14

## 2024-03-17 MED ORDER — ALBUTEROL SULFATE HFA 108 (90 BASE) MCG/ACT IN AERS
2.0000 | INHALATION_SPRAY | Freq: Four times a day (QID) | RESPIRATORY_TRACT | 0 refills | Status: AC | PRN
Start: 2024-03-17 — End: ?

## 2024-03-18 ENCOUNTER — Encounter: Payer: Self-pay | Admitting: Pediatrics

## 2024-03-18 NOTE — Assessment & Plan Note (Addendum)
 Current hypotension likely due to dehydration from lisinopril. Suspect dizziness from this. Positive orthostatic vitals - Hold lisinopril temporarily. - Monitor blood pressure closely.

## 2024-03-18 NOTE — Assessment & Plan Note (Signed)
 Due for vitamin B12 injection. Assistance provided to reschedule appointment with the cancer center. - Provide syringe for vitamin B12 injection. - Assist with rescheduling appointment with the cancer center for further management.

## 2024-03-18 NOTE — Assessment & Plan Note (Signed)
 Symptoms consistent with COPD. Breztri inhaler recommended for better symptom control. - Prescribe Breztri inhaler for daily use. - Continue albuterol inhaler for rescue use.

## 2024-03-18 NOTE — Assessment & Plan Note (Signed)
 Excessive alcohol consumption with withdrawal symptoms. Interested in reducing alcohol use. - Prescribe naltrexone to reduce alcohol cravings.

## 2024-03-18 NOTE — Assessment & Plan Note (Signed)
 Frequent hot flashes with sweating. Blood work needed to rule out thyroid issues. - Order blood work to assess thyroid function and other relevant levels.

## 2024-03-19 ENCOUNTER — Encounter: Payer: Self-pay | Admitting: Pediatrics

## 2024-03-19 ENCOUNTER — Ambulatory Visit: Payer: MEDICAID | Admitting: Pediatrics

## 2024-03-19 VITALS — BP 124/83 | HR 84 | Temp 97.5°F | Wt 126.2 lb

## 2024-03-19 DIAGNOSIS — J449 Chronic obstructive pulmonary disease, unspecified: Secondary | ICD-10-CM

## 2024-03-19 DIAGNOSIS — F32A Depression, unspecified: Secondary | ICD-10-CM

## 2024-03-19 DIAGNOSIS — Z862 Personal history of diseases of the blood and blood-forming organs and certain disorders involving the immune mechanism: Secondary | ICD-10-CM | POA: Diagnosis not present

## 2024-03-19 DIAGNOSIS — F419 Anxiety disorder, unspecified: Secondary | ICD-10-CM

## 2024-03-19 DIAGNOSIS — I10 Essential (primary) hypertension: Secondary | ICD-10-CM

## 2024-03-19 DIAGNOSIS — Z5941 Food insecurity: Secondary | ICD-10-CM

## 2024-03-19 DIAGNOSIS — Z59819 Housing instability, housed unspecified: Secondary | ICD-10-CM

## 2024-03-19 NOTE — Patient Instructions (Signed)
 Stop buspar I placed a referral for a specialist

## 2024-03-19 NOTE — Progress Notes (Unsigned)
 Office Visit  BP 124/83   Pulse 84   Temp (!) 97.5 F (36.4 C) (Oral)   Wt 126 lb 3.2 oz (57.2 kg)   LMP 07/24/2016   SpO2 98%   BMI 20.95 kg/m    Subjective:    Patient ID: Anna Lucas, female    DOB: Apr 23, 1971, 53 y.o.   MRN: [id number]  HPI: Anna Lucas is a 53 y.o. female  Chief Complaint  Patient presents with   Medical Management of Chronic Issues    Discussed the use of AI scribe software for clinical note transcription with the patient, who gave verbal consent to proceed.  History of Present Illness   Anna Lucas "Ander Slade" is a 53 year old female who presents with concerns about weight loss and appetite issues.  She has experienced significant weight loss, with her current weight at 121 pounds, which she describes as much lower than usual. This weight loss is attributed to a lack of appetite, which she links to her stressful living situation. Her appetite improves when she is at work, but otherwise, she struggles to maintain adequate nutrition.  She experiences significant emotional distress, feeling isolated from her family and having a strained relationship with her mother. She desires to see her sister and is dissatisfied with her current therapist, whom she describes as unprofessional. She is seeking a new therapist and mentions involvement with a peer support person for housing assistance, which she finds unhelpful.  Her living situation is stressful, as she resides with a man she refers to as 'some weird old man' and does not consider him a friend. She feels unsafe and trapped in her current housing, which exacerbates her stress and impacts her eating habits. She feels isolated, with her daughter living four hours away, and reports having no friends and feeling used by past acquaintances. She has a small dog, which she describes as her 'everything'.  She is currently taking citalopram 40 mg, gabapentin 300 mg twice a day (though prescribed three times a day),  hydroxyzine 50 mg three times a day, and buspirone at night, which causes dizziness. She also mentions naltrexone, which she has not yet picked up from the pharmacy. No use of lisinopril as previously instructed.  No wheezing and her blood pressure is better.      Relevant past medical, surgical, family and social history reviewed and updated as indicated. Interim medical history since our last visit reviewed. Allergies and medications reviewed and updated.  ROS per HPI unless specifically indicated above     Objective:    BP 124/83   Pulse 84   Temp (!) 97.5 F (36.4 C) (Oral)   Wt 126 lb 3.2 oz (57.2 kg)   LMP 07/24/2016   SpO2 98%   BMI 20.95 kg/m   Wt Readings from Last 3 Encounters:  03/19/24 126 lb 3.2 oz (57.2 kg)  03/11/24 131 lb 3.2 oz (59.5 kg)  10/24/23 132 lb (59.9 kg)     Physical Exam Constitutional:      Appearance: Normal appearance.  Pulmonary:     Effort: Pulmonary effort is normal.  Musculoskeletal:        General: Normal range of motion.  Skin:    Comments: Normal skin color  Neurological:     General: No focal deficit present.     Mental Status: She is alert. Mental status is at baseline.  Psychiatric:        Mood and Affect: Mood normal.  Behavior: Behavior normal.        Thought Content: Thought content normal.         03/11/2024   10:24 AM 07/10/2022   10:38 AM 07/03/2022    9:10 AM  Depression screen PHQ 2/9  Decreased Interest 0 0 0  Down, Depressed, Hopeless 0 0 0  PHQ - 2 Score 0 0 0  Altered sleeping 0    Tired, decreased energy 0    Change in appetite 0    Feeling bad or failure about yourself  0    Trouble concentrating 0    Moving slowly or fidgety/restless 0    Suicidal thoughts 0    PHQ-9 Score 0         03/11/2024   10:24 AM  GAD 7 : Generalized Anxiety Score  Nervous, Anxious, on Edge 1  Control/stop worrying 1  Worry too much - different things 0  Trouble relaxing 0  Restless 0  Easily annoyed or  irritable 0  Afraid - awful might happen 0  Total GAD 7 Score 2       Assessment & Plan:  Assessment & Plan   Primary hypertension Assessment & Plan: Blood pressure well-controlled without lisinopril. - Do not resume lisinopril.  Orders: -     Comprehensive metabolic panel with GFR  Anxiety and depression Assessment & Plan: Significant emotional distress due to living situation and family issues. Dissatisfaction with current mental health providers. Discontinued buspirone due to side effects. - Refer to a new therapist and psychiatrist. - Provide social work support for housing issues.  Orders: -     Ambulatory referral to Psychiatry  History of anemia due to vitamin B12 deficiency Assessment & Plan: Has not obtained syringes for B12 injections. Plan to check B12 levels. - Check B12 levels.  Orders: -     CBC with Differential/Platelet -     Vitamin B12 -     Folate -     Iron, TIBC and Ferritin Panel  Food insecurity Housing insecurity Weight loss to 121 pounds due to poor appetite and inadequate intake, worsened by living conditions. - Provide food pantry support. -     AMB Referral [facility] Care Management  Chronic obstructive pulmonary disease, unspecified COPD type (HCC) Assessment & Plan: Improved symptoms with breztri inhaler, reduced wheezing. - Continue current inhaler regimen twice daily.  Orders: -     Breztri Aerosphere; Inhale 2 puffs into the lungs 2 (two) times daily.  Dispense: 10.7 g; Refill: 11     Follow up plan: Return in about 3 weeks (around 04/09/2024) for HTN.  Jackolyn Confer, MD

## 2024-03-20 ENCOUNTER — Telehealth: Payer: Self-pay

## 2024-03-20 LAB — IRON,TIBC AND FERRITIN PANEL
Ferritin: 942 ng/mL — ABNORMAL HIGH (ref 15–150)
Iron Saturation: 43 % (ref 15–55)
Iron: 134 ug/dL (ref 27–159)
Total Iron Binding Capacity: 314 ug/dL (ref 250–450)
UIBC: 180 ug/dL (ref 131–425)

## 2024-03-20 NOTE — Progress Notes (Signed)
   Telephone encounter was:  Unsuccessful.  03/20/2024 Name: Anna Lucas MRN: [id number] DOB: [birth year]  Unsuccessful outbound call made today to assist with:  Transportation Needs , Food Insecurity, and Financial Difficulties related to Financial strain  [employer] Attempt:  1st Attempt  A HIPAA compliant voice message was left requesting a return call.  Instructed patient to call back    Lenard Forth [facility] Guide, Phone: [phone] Fax: [phone] Website: [email or web address]

## 2024-03-21 LAB — CBC WITH DIFFERENTIAL/PLATELET
Basophils Absolute: 0 10*3/uL (ref 0.0–0.2)
Basos: 1 %
EOS (ABSOLUTE): 0 10*3/uL (ref 0.0–0.4)
Eos: 0 %
Hematocrit: 38.1 % (ref 34.0–46.6)
Hemoglobin: 12.4 g/dL (ref 11.1–15.9)
Immature Grans (Abs): 0 10*3/uL (ref 0.0–0.1)
Immature Granulocytes: 0 %
Lymphocytes Absolute: 2.2 10*3/uL (ref 0.7–3.1)
Lymphs: 65 %
MCH: 32.4 pg (ref 26.6–33.0)
MCHC: 32.5 g/dL (ref 31.5–35.7)
MCV: 100 fL — ABNORMAL HIGH (ref 79–97)
Monocytes Absolute: 0.3 10*3/uL (ref 0.1–0.9)
Monocytes: 8 %
Neutrophils Absolute: 0.9 10*3/uL — ABNORMAL LOW (ref 1.4–7.0)
Neutrophils: 26 %
Platelets: 212 10*3/uL (ref 150–450)
RBC: 3.83 x10E6/uL (ref 3.77–5.28)
RDW: 14.1 % (ref 11.7–15.4)
WBC: 3.4 10*3/uL (ref 3.4–10.8)

## 2024-03-21 LAB — COMPREHENSIVE METABOLIC PANEL WITH GFR
ALT: 31 IU/L (ref 0–32)
AST: 49 IU/L — ABNORMAL HIGH (ref 0–40)
Albumin: 4.6 g/dL (ref 3.9–4.9)
Alkaline Phosphatase: 135 IU/L — ABNORMAL HIGH (ref 44–121)
BUN/Creatinine Ratio: 8 — ABNORMAL LOW (ref 9–23)
BUN: 7 mg/dL (ref 6–24)
Bilirubin Total: <0.2 mg/dL (ref 0.0–1.2)
CO2: 25 mmol/L (ref 20–29)
Calcium: 9.1 mg/dL (ref 8.7–10.2)
Chloride: 104 mmol/L (ref 96–106)
Creatinine, Ser: 0.86 mg/dL (ref 0.57–1.00)
Globulin, Total: 2.7 g/dL (ref 1.5–4.5)
Glucose: 76 mg/dL (ref 70–99)
Potassium: 4.7 mmol/L (ref 3.5–5.2)
Sodium: 144 mmol/L (ref 134–144)
Total Protein: 7.3 g/dL (ref 6.0–8.5)
eGFR: 82 mL/min/{1.73_m2} (ref >59)

## 2024-03-21 LAB — FOLATE: Folate: <2 ng/mL — ABNORMAL LOW (ref >3.0)

## 2024-03-21 LAB — VITAMIN B12: Vitamin B-12: 961 pg/mL (ref 232–1245)

## 2024-03-23 ENCOUNTER — Telehealth: Payer: Self-pay | Admitting: Oncology

## 2024-03-23 ENCOUNTER — Telehealth: Payer: Self-pay

## 2024-03-23 NOTE — Progress Notes (Signed)
   Telephone encounter was:  Unsuccessful.  03/23/2024 Name: Anna Lucas MRN: [id number] DOB: Jan 23, 1971  Unsuccessful outbound call made today to assist with:  Transportation Needs , Food Insecurity, and Financial Difficulties related to Financial strain  [employer] Attempt:  2nd Attempt  A HIPAA compliant voice message was left requesting a return call.  Instructed patient to call back   Lenard Forth [facility] Guide, Phone: [phone] Fax: [phone] Website: [email or web address]

## 2024-03-23 NOTE — Telephone Encounter (Signed)
 Patient called to reschedule missed appointment. Appointment was already rescheduled. Gave details and she states she will be here.   She asked in the future to be called of appointments.

## 2024-03-24 ENCOUNTER — Telehealth: Payer: Self-pay

## 2024-03-24 NOTE — Progress Notes (Signed)
   Telephone encounter was:  Unsuccessful.  03/24/2024 Name: Anna Lucas MRN: [id number] DOB: Sep 29, 1971  Unsuccessful outbound call made today to assist with:  Food Insecurity and Financial Difficulties related to Financial strain  Outreach Attempt:  3rd Attempt.  Referral closed unable to contact patient. Unable to laeve a message    Lenard Forth [facility]  [facility] Guide, Phone: [phone] Fax: [phone] Website: [email or web address]

## 2024-03-24 NOTE — Progress Notes (Signed)
 Complex Care Management Note Care Guide Note  03/24/2024 Name: Anna Lucas MRN: [id number] DOB: [birth year]   Complex Care Management Outreach Attempts: An unsuccessful telephone outreach was attempted today to offer the patient information about available complex care management services.  Follow Up Plan:  Additional outreach attempts will be made to offer the patient complex care management information and services.   Encounter Outcome:  No Answer  Penne Lash , RMA       [facility], [facility] Guide  Direct Dial: [phone]  Website: [email or web address]

## 2024-03-25 ENCOUNTER — Encounter: Payer: Self-pay | Admitting: Pediatrics

## 2024-03-25 MED ORDER — BREZTRI AEROSPHERE 160-9-4.8 MCG/ACT IN AERO: 2.0000 | INHALATION_SPRAY | Freq: Two times a day (BID) | RESPIRATORY_TRACT | 11 refills | Status: AC

## 2024-03-25 NOTE — Assessment & Plan Note (Signed)
 Significant emotional distress due to living situation and family issues. Dissatisfaction with current mental health providers. Discontinued buspirone due to side effects. - Refer to a new therapist and psychiatrist. - Provide social work support for housing issues.

## 2024-03-25 NOTE — Assessment & Plan Note (Signed)
 Blood pressure well-controlled without lisinopril. - Do not resume lisinopril.

## 2024-03-25 NOTE — Assessment & Plan Note (Signed)
 Improved symptoms with breztri inhaler, reduced wheezing. - Continue current inhaler regimen twice daily.

## 2024-03-25 NOTE — Assessment & Plan Note (Signed)
 Has not obtained syringes for B12 injections. Plan to check B12 levels. - Check B12 levels.

## 2024-03-30 ENCOUNTER — Ambulatory Visit (INDEPENDENT_AMBULATORY_CARE_PROVIDER_SITE_OTHER): Payer: MEDICAID | Admitting: Family Medicine

## 2024-03-30 ENCOUNTER — Inpatient Hospital Stay: Payer: MEDICAID | Admitting: Oncology

## 2024-03-30 ENCOUNTER — Encounter: Payer: Self-pay | Admitting: Family Medicine

## 2024-03-30 ENCOUNTER — Ambulatory Visit: Payer: Self-pay

## 2024-03-30 VITALS — BP 137/92 | HR 111 | Ht 65.0 in | Wt 121.0 lb

## 2024-03-30 DIAGNOSIS — R300 Dysuria: Secondary | ICD-10-CM | POA: Diagnosis not present

## 2024-03-30 DIAGNOSIS — N3090 Cystitis, unspecified without hematuria: Secondary | ICD-10-CM

## 2024-03-30 DIAGNOSIS — I10 Essential (primary) hypertension: Secondary | ICD-10-CM

## 2024-03-30 LAB — POCT URINALYSIS DIPSTICK
Bilirubin, UA: NEGATIVE
Glucose, UA: NEGATIVE
Ketones, UA: NEGATIVE
Nitrite, UA: NEGATIVE
Protein, UA: NEGATIVE
Spec Grav, UA: 1.01 (ref 1.010–1.025)
Urobilinogen, UA: 0.2 U/dL
pH, UA: 6 (ref 5.0–8.0)

## 2024-03-30 MED ORDER — NITROFURANTOIN MONOHYD MACRO 100 MG PO CAPS
100.0000 mg | ORAL_CAPSULE | Freq: Two times a day (BID) | ORAL | 0 refills | Status: AC
Start: 2024-03-30 — End: 2024-04-06

## 2024-03-30 NOTE — Telephone Encounter (Signed)
 Copied from CRM [id number]. Topic: Clinical - Red Word Triage >> Mar 30, 2024  9:31 AM Tiffany B wrote: Red Word that prompted transfer to Nurse Triage: Patient experiencing frequent urination and abdominal pain.  Chief Complaint: urination pain Symptoms: pain with urination, abd pain, urinary frequency & urgency, low back pain, hot and cold flashes Frequency: x 2 days Pertinent Negatives: Patient denies bloody urine Disposition: [] ED /[] Urgent Care (no appt availability in office) / [x] Appointment(In office/virtual)/ []  [facility]/ [] Home Care/ [] Refused Recommended Disposition /[] [facility]/ []  Follow-up with PCP Additional Notes: hot and cold flashes,  Frequency, pain with urination, abd pain, urgency, low back pain  Reason for Disposition  [1] Painful urination AND [2] EITHER frequency or urgency AND [3] has on-call doctor  Answer Assessment - Initial Assessment Questions 1. SEVERITY: "How bad is the pain?"  (e.g., Scale 1-10; mild, moderate, or severe)   - MILD (1-3): complains slightly about urination hurting   - MODERATE (4-7): interferes with normal activities     - SEVERE (8-10): excruciating, unwilling or unable to urinate because of the pain      severe 2. FREQUENCY: "How many times have you had painful urination today?"      Each time urination 3. PATTERN: "Is pain present every time you urinate or just sometimes?"      yes 4. ONSET: "When did the painful urination start?"      X 2 days 5. FEVER: "Do you have a fever?" If Yes, ask: "What is your temperature, how was it measured, and when did it start?"     unknown 6. PAST UTI: "Have you had a urine infection before?" If Yes, ask: "When was the last time?" and "What happened that time?"      Frequency, pain with urination, abd pain, urgency, low back pain 7. CAUSE: "What do you think is causing the painful urination?"  (e.g., UTI, scratch, Herpes sore)     unknown 8. OTHER SYMPTOMS: "Do you have any  other symptoms?" (e.g., blood in urine, flank pain,  Frequency, pain with urination, abd pain, urgency, low back pain, genital sores, urgency, vaginal discharge)     Flank pain,  9. PREGNANCY: "Is there any chance you are pregnant?" "When was your last menstrual period?"     N/a  Protocols used: Urination Pain - Female-A-AH

## 2024-03-30 NOTE — Progress Notes (Signed)
 Complex Care Management Note Care Guide Note  03/30/2024 Name: Anna Lucas MRN: [id number] DOB: 1971-03-19   Complex Care Management Outreach Attempts: A second unsuccessful outreach was attempted today to offer the patient with information about available complex care management services.  Follow Up Plan:  No further outreach attempts will be made at this time. We have been unable to contact the patient to offer or enroll patient in complex care management services.  Encounter Outcome:  No Answer  Penne Lash , RMA     [place]  [facility], [facility] Guide  Direct Dial: [phone]  Website: [email or web address]

## 2024-03-30 NOTE — Progress Notes (Signed)
 Date:  03/30/2024   Name:  Anna Lucas   DOB:  04-Apr-1971   MRN:  [id number]   Chief Complaint: Dysuria (Present a couple days)  Dysuria  This is a new problem. The current episode started in the past 7 days. The problem occurs every urination. The problem has been gradually worsening. The quality of the pain is described as burning. The pain is moderate. There has been no fever. She is Sexually active. There is No history of pyelonephritis. Associated symptoms include frequency, hematuria, hesitancy and urgency. Pertinent negatives include no chills, discharge, flank pain, nausea, possible pregnancy, sweats or vomiting. She has tried increased fluids for the symptoms. Her past medical history is significant for recurrent UTIs.    Lab Results  Component Value Date   NA 144 03/19/2024   K 4.7 03/19/2024   CO2 25 03/19/2024   GLUCOSE 76 03/19/2024   BUN 7 03/19/2024   CREATININE 0.86 03/19/2024   CALCIUM 9.1 03/19/2024   EGFR 82 03/19/2024   GFRNONAA >60 10/24/2023   Lab Results  Component Value Date   CHOL 227 (H) 03/11/2024   HDL 105 03/11/2024   LDLCALC 102 (H) 03/11/2024   TRIG 120 03/11/2024   CHOLHDL 2.2 03/11/2024   Lab Results  Component Value Date   TSH 1.050 03/11/2024   Lab Results  Component Value Date   HGBA1C 5.0 03/11/2024   Lab Results  Component Value Date   WBC 3.4 03/19/2024   HGB 12.4 03/19/2024   HCT 38.1 03/19/2024   MCV 100 (H) 03/19/2024   PLT 212 03/19/2024   Lab Results  Component Value Date   ALT 31 03/19/2024   AST 49 (H) 03/19/2024   ALKPHOS 135 (H) 03/19/2024   BILITOT <0.2 03/19/2024   No results found for: "25OHVITD2", "25OHVITD3", "VD25OH"   Review of Systems  Constitutional:  Negative for chills and fever.  Respiratory:  Negative for cough, chest tightness, shortness of breath and wheezing.   Cardiovascular:  Negative for chest pain, palpitations and leg swelling.  Gastrointestinal:  Positive for abdominal pain. Negative  for blood in stool, nausea and vomiting.  Endocrine: Negative for polydipsia and polyuria.  Genitourinary:  Positive for dysuria, frequency, hematuria, hesitancy and urgency. Negative for flank pain.    Patient Active Problem List   Diagnosis Date Noted   Chronic obstructive pulmonary disease (HCC) 03/11/2024   Seborrheic dermatitis 03/11/2024   History of anemia due to vitamin B12 deficiency 03/11/2024   Symptomatic menopausal or female climacteric states 01/31/2023   Obesity (BMI 30-39.9) 06/22/2022   Anxiety and depression 05/11/2022   GERD (gastroesophageal reflux disease) 05/11/2022   Peripheral neuropathy 05/11/2022   Alcohol abuse 05/11/2022   Thrombocytopenia (HCC) 05/11/2022   Menopausal symptoms 05/08/2022   Hypertension 09/03/2017   Intractable vomiting 08/17/2016   Pyelonephritis 12/19/2015    Allergies  Allergen Reactions   Prednisone     Feet swelling     Past Surgical History:  Procedure Laterality Date   CESAREAN SECTION      Social History   Tobacco Use   Smoking status: Every Day    Current packs/day: 0.75    Average packs/day: 0.7 packs/day for 36.3 years (27.2 ttl pk-yrs)    Types: Cigarettes    Start date: 12/25/1987   Smokeless tobacco: Never  Vaping Use   Vaping status: Never Used  Substance Use Topics   Alcohol use: Yes    Comment: twice a week   Drug use: Never  Medication list has been reviewed and updated.  Current Meds  Medication Sig   albuterol (VENTOLIN HFA) 108 (90 Base) MCG/ACT inhaler Inhale 2 puffs into the lungs every 6 (six) hours as needed for wheezing or shortness of breath.   budeson-glycopyrrolate-formoterol (BREZTRI AEROSPHERE) 160-9-4.8 MCG/ACT AERO Inhale 2 puffs into the lungs 2 (two) times daily.   citalopram (CELEXA) 40 MG tablet Take 40 mg by mouth daily.   gabapentin (NEURONTIN) 300 MG capsule Take 1 capsule (300 mg total) by mouth 3 (three) times daily.   hydrOXYzine (ATARAX) 50 MG tablet Take 1 tablet (50  mg total) by mouth 3 (three) times daily as needed.   KETOCONAZOLE, TOPICAL, 1 % SHAM Use once or twice daily in the shower for 2 weeks   meloxicam (MOBIC) 7.5 MG tablet TAKE 1 TABLET BY MOUTH TWICE DAILY FOR 15 DAYS   naltrexone (DEPADE) 50 MG tablet Take 0.5 tablets (25 mg total) by mouth daily for 7 days, THEN 1 tablet (50 mg total) daily for 21 days.   Syringe/Needle, Disp, (SYRINGE 3CC/25GX1") 25G X 1" 3 ML MISC 1 Syringe by Does not apply route every 30 (thirty) days.   traZODone (DESYREL) 50 MG tablet Take 1 tablet (50 mg total) by mouth at bedtime.       03/11/2024   10:24 AM  GAD 7 : Generalized Anxiety Score  Nervous, Anxious, on Edge 1  Control/stop worrying 1  Worry too much - different things 0  Trouble relaxing 0  Restless 0  Easily annoyed or irritable 0  Afraid - awful might happen 0  Total GAD 7 Score 2       03/11/2024   10:24 AM 07/10/2022   10:38 AM 07/03/2022    9:10 AM  Depression screen PHQ 2/9  Decreased Interest 0 0 0  Down, Depressed, Hopeless 0 0 0  PHQ - 2 Score 0 0 0  Altered sleeping 0    Tired, decreased energy 0    Change in appetite 0    Feeling bad or failure about yourself  0    Trouble concentrating 0    Moving slowly or fidgety/restless 0    Suicidal thoughts 0    PHQ-9 Score 0      BP Readings from Last 3 Encounters:  03/30/24 (!) 162/98  03/19/24 124/83  03/11/24 (!) 78/46    Physical Exam Vitals and nursing note reviewed.  Constitutional:      General: She is not in acute distress.    Appearance: She is not diaphoretic.  HENT:     Head: Normocephalic and atraumatic.     Right Ear: Tympanic membrane and external ear normal.     Left Ear: Tympanic membrane and external ear normal.     Nose: Nose normal.     Mouth/Throat:     Mouth: Mucous membranes are moist.  Eyes:     Conjunctiva/sclera: Conjunctivae normal.     Pupils: Pupils are equal, round, and reactive to light.  Neck:     Thyroid: No thyromegaly.     Vascular:  No JVD.  Cardiovascular:     Rate and Rhythm: Normal rate and regular rhythm.     Heart sounds: Normal heart sounds. No murmur heard.    No friction rub. No gallop.  Pulmonary:     Effort: Pulmonary effort is normal.     Breath sounds: Normal breath sounds. No wheezing, rhonchi or rales.  Abdominal:     General: Bowel sounds are normal.  Palpations: Abdomen is soft. There is no hepatomegaly, splenomegaly or mass.     Tenderness: There is abdominal tenderness in the suprapubic area. There is no right CVA tenderness, left CVA tenderness, guarding or rebound.  Musculoskeletal:        General: Normal range of motion.     Cervical back: Neck supple.  Lymphadenopathy:     Cervical: No cervical adenopathy.  Skin:    General: Skin is warm and dry.  Neurological:     Mental Status: She is alert.     Wt Readings from Last 3 Encounters:  03/30/24 121 lb (54.9 kg)  03/19/24 126 lb 3.2 oz (57.2 kg)  03/11/24 131 lb 3.2 oz (59.5 kg)    BP (!) 162/98   Pulse (!) 111   Ht 5\' 5"  (1.651 m)   Wt 121 lb (54.9 kg)   LMP 07/24/2016   SpO2 98%   BMI 20.14 kg/m   Assessment and Plan: 1. Dysuria (Primary) New onset.  Persistent.  Stable.  Without fever or chills but is symptomatic with dysuria hesitancy urgency as well as frequency.  We will check urine dipstick for evaluation and send for culture if positive for leukocytes.  Dipstick is positive for leukocytes and we will send for culture examination proceeds below - POCT urinalysis dipstick - Urine Culture  2. Cystitis New onset.  Persistent.  Symptomatology includes dysuria urgency frequency as well as possible hematuria.  Patient also has no CVA tenderness but is tender but soft of the suprapubic area.  Sam is consistent with cystitis and we will treat with Macrobid twice a day for 7 days and await culture results.  Patient denies that there is a likelihood STD with a recent contact but we will keep this in mind when we reviewed culture  results.  3. Primary hypertension Chronic.  Persistent.  Recently had medications discontinued due to hypotension that was noted.  Patient has had resolution of symptomatology blood pressure is elevated initially more so than the repeat at 137/92.  I have instructed the patient to call back to [facility] for evaluation and perhaps resumption of one of the 2 medications that she was on as a combination.  In the meantime patient has been instructed to limit sodium intake and we will recheck on an as-needed basis.    Elizabeth Sauer, MD

## 2024-03-31 ENCOUNTER — Inpatient Hospital Stay
Payer: MEDICAID | Attending: Program of All-Inclusive Care for the Elderly (PACE) Provider Organization | Admitting: Oncology

## 2024-03-31 ENCOUNTER — Encounter: Payer: Self-pay | Admitting: Oncology

## 2024-03-31 VITALS — BP 140/95 | HR 97 | Temp 99.3°F | Resp 19 | Ht 65.0 in | Wt 126.3 lb

## 2024-03-31 DIAGNOSIS — D7589 Other specified diseases of blood and blood-forming organs: Secondary | ICD-10-CM

## 2024-03-31 DIAGNOSIS — E538 Deficiency of other specified B group vitamins: Secondary | ICD-10-CM | POA: Diagnosis not present

## 2024-03-31 DIAGNOSIS — Z862 Personal history of diseases of the blood and blood-forming organs and certain disorders involving the immune mechanism: Secondary | ICD-10-CM | POA: Diagnosis not present

## 2024-03-31 DIAGNOSIS — D61818 Other pancytopenia: Secondary | ICD-10-CM | POA: Insufficient documentation

## 2024-03-31 DIAGNOSIS — D539 Nutritional anemia, unspecified: Secondary | ICD-10-CM | POA: Insufficient documentation

## 2024-03-31 MED ORDER — FOLIC ACID 1 MG PO TABS: 1.0000 mg | ORAL_TABLET | Freq: Every day | ORAL | 3 refills | Status: DC

## 2024-04-02 LAB — URINE CULTURE

## 2024-04-03 ENCOUNTER — Encounter: Payer: Self-pay | Admitting: Family Medicine

## 2024-04-05 NOTE — Progress Notes (Unsigned)
 Hematology/Oncology Consult note [facility]  [phone] [phone]  Patient Care Team: Hadassah Letters, MD as PCP - General (Family Medicine) Burnie Cartwright, RN as Registered Nurse Arlette Benders, RN (Inactive) as Registered Nurse Avonne Boettcher, MD as Consulting Physician (Oncology)   Name of the patient: Anna Lucas  [id number]  1971-09-14   Date of visit: 04/05/24  Diagnosis- pancytopenia likely transient secondary to acute viral infection Macrocytic anemia possibly secondary to alcohol use  Chief complaint/ Reason for visit- routine f/u of pancytopenia  Heme/Onc history: Patient is a 53 year old female presenting with abnormal LFTs secondary to acute hepatitis the cause of which was unclear.  Patient does have a history of chronic alcohol abuse but the acute liver failure was not attributed to it.  As far as her LFTs go her AST was elevated at 4577 and ALT 1859 on 05/10/2022 and presently at 2036 and 1017 respectively.  Bilirubin has remained stable to mildly increased at 2.6.  Hematology has been consulted for her pancytopenia.  Patient had a normal white cell count of 6.2 with an H&H of 14.7/43.7 with a platelet count of 93 on 05/10/2022.  Her white count has progressively gone down to 1.2 with an H&H of 11.4/34 and a platelet count of 37.  At baseline patient's platelet count fluctuated between 50s to 90s at least since 2016.  White cell count and hemoglobin has historically been normal.   Interval history- `  ECOG PS- 1 Pain scale- 0   Review of systems- ROS    Allergies  Allergen Reactions   Prednisone     Feet swelling      Past Medical History:  Diagnosis Date   Abscessed tooth 01/02/2021   Acid reflux    Acute hepatitis 05/11/2022   Alcohol abuse    Anxiety    Atopic dermatitis, unspecified 12/03/2018   Bilateral primary osteoarthritis of knee 04/25/2023   Depression    Gout 02/17/2021   Hypertension     Hypomagnesemia 05/12/2022   Hyponatremia 05/12/2022   Hypophosphatemia 05/12/2022   Myelosuppression 05/12/2022   PTSD (post-traumatic stress disorder)      Past Surgical History:  Procedure Laterality Date   CESAREAN SECTION      Social History   Socioeconomic History   Marital status: Single    Spouse name: Not on file   Number of children: 2   Years of education: Not on file   Highest education level: Associate degree: academic program  Occupational History   Not on file  Tobacco Use   Smoking status: Every Day    Current packs/day: 0.75    Average packs/day: 0.7 packs/day for 36.3 years (27.2 ttl pk-yrs)    Types: Cigarettes    Start date: 12/25/1987   Smokeless tobacco: Never  Vaping Use   Vaping status: Never Used  Substance and Sexual Activity   Alcohol use: Yes    Comment: twice a week   Drug use: Never   Sexual activity: Not Currently  Other Topics Concern   Not on file  Social History Narrative   ** Merged History Encounter **       Social Drivers of Corporate investment banker Strain: Not on file  Food Insecurity: Not on file  Transportation Needs: Not on file  Physical Activity: Not on file  Stress: Not on file  Social Connections: Not on file  Intimate Partner Violence: Not on file    Family History  Problem Relation Age of Onset   CAD Paternal Grandfather    Stroke Paternal Grandfather    Diabetes Mellitus II Maternal Grandmother    Emphysema Maternal Grandmother      Current Outpatient Medications:    folic acid (FOLVITE) 1 MG tablet, Take 1 tablet (1 mg total) by mouth daily., Disp: 30 tablet, Rfl: 3   albuterol (VENTOLIN HFA) 108 (90 Base) MCG/ACT inhaler, Inhale 2 puffs into the lungs every 6 (six) hours as needed for wheezing or shortness of breath., Disp: 1 each, Rfl: 0   budeson-glycopyrrolate-formoterol (BREZTRI AEROSPHERE) 160-9-4.8 MCG/ACT AERO, Inhale 2 puffs into the lungs 2 (two) times daily., Disp: 10.7 g, Rfl: 11    citalopram (CELEXA) 40 MG tablet, Take 40 mg by mouth daily., Disp: , Rfl:    gabapentin (NEURONTIN) 300 MG capsule, Take 1 capsule (300 mg total) by mouth 3 (three) times daily., Disp: , Rfl:    hydrOXYzine (ATARAX) 50 MG tablet, Take 1 tablet (50 mg total) by mouth 3 (three) times daily as needed., Disp: 30 tablet, Rfl: 0   KETOCONAZOLE, TOPICAL, 1 % SHAM, Use once or twice daily in the shower for 2 weeks, Disp: 125 mL, Rfl: 0   lisinopril (ZESTRIL) 10 MG tablet, Take 10 mg by mouth daily., Disp: , Rfl:    meloxicam (MOBIC) 7.5 MG tablet, TAKE 1 TABLET BY MOUTH TWICE DAILY FOR 15 DAYS, Disp: 15 tablet, Rfl: 0   naltrexone (DEPADE) 50 MG tablet, Take 0.5 tablets (25 mg total) by mouth daily for 7 days, THEN 1 tablet (50 mg total) daily for 21 days., Disp: 25 tablet, Rfl: 0   nitrofurantoin, macrocrystal-monohydrate, (MACROBID) 100 MG capsule, Take 1 capsule (100 mg total) by mouth 2 (two) times daily for 7 days., Disp: 14 capsule, Rfl: 0   Syringe/Needle, Disp, (SYRINGE 3CC/25GX1") 25G X 1" 3 ML MISC, 1 Syringe by Does not apply route every 30 (thirty) days., Disp: 12 each, Rfl: 0   traZODone (DESYREL) 50 MG tablet, Take 1 tablet (50 mg total) by mouth at bedtime., Disp: 30 tablet, Rfl: 1  Physical exam:  Vitals:   03/31/24 1530  BP: (!) 140/95  Pulse: 97  Resp: 19  Temp: 99.3 F (37.4 C)  TempSrc: Tympanic  SpO2: 100%  Weight: 126 lb 4.8 oz (57.3 kg)  Height: 5\' 5"  (1.651 m)   Physical Exam   I have personally reviewed labs listed below:    Latest Ref Rng & Units 03/19/2024   11:21 AM  CMP  Glucose 70 - 99 mg/dL 76   BUN 6 - 24 mg/dL 7   Creatinine 6.21 - 3.08 mg/dL 6.57   Sodium 846 - 962 mmol/L 144   Potassium 3.5 - 5.2 mmol/L 4.7   Chloride 96 - 106 mmol/L 104   CO2 20 - 29 mmol/L 25   Calcium 8.7 - 10.2 mg/dL 9.1   Total Protein 6.0 - 8.5 g/dL 7.3   Total Bilirubin 0.0 - 1.2 mg/dL <9.5   Alkaline Phos 44 - 121 IU/L 135   AST 0 - 40 IU/L 49   ALT 0 - 32 IU/L 31        Latest Ref Rng & Units 03/19/2024   11:21 AM  CBC  WBC 3.4 - 10.8 x10E3/uL 3.4   Hemoglobin 11.1 - 15.9 g/dL 28.4   Hematocrit 13.2 - 46.6 % 38.1   Platelets 150 - 450 x10E3/uL 212    I have personally reviewed Radiology images listed below: No images  are attached to the encounter.  No results found.   Assessment and plan- Patient is a 53 y.o. female ***   Visit Diagnosis 1. Folate deficiency   2. Macrocytosis   3. History of anemia due to vitamin B12 deficiency      Dr. Seretha Dance, MD, MPH [facility] [phone] 04/05/2024 8:26 PM

## 2024-04-08 ENCOUNTER — Ambulatory Visit: Payer: MEDICAID | Admitting: Pediatrics

## 2024-04-28 ENCOUNTER — Emergency Department
Admission: EM | Admit: 2024-04-28 | Discharge: 2024-04-28 | Disposition: A | Discharge status: Home / Self Care | Payer: MEDICAID | Attending: Emergency Medicine | Admitting: Emergency Medicine

## 2024-04-28 ENCOUNTER — Other Ambulatory Visit: Payer: Self-pay

## 2024-04-28 ENCOUNTER — Encounter: Payer: Self-pay | Admitting: Emergency Medicine

## 2024-04-28 ENCOUNTER — Emergency Department: Payer: MEDICAID

## 2024-04-28 DIAGNOSIS — M25562 Pain in left knee: Secondary | ICD-10-CM | POA: Diagnosis present

## 2024-04-28 DIAGNOSIS — I10 Essential (primary) hypertension: Secondary | ICD-10-CM | POA: Insufficient documentation

## 2024-04-28 DIAGNOSIS — W010XXA Fall on same level from slipping, tripping and stumbling without subsequent striking against object, initial encounter: Secondary | ICD-10-CM | POA: Insufficient documentation

## 2024-04-28 DIAGNOSIS — Y92009 Unspecified place in unspecified non-institutional (private) residence as the place of occurrence of the external cause: Secondary | ICD-10-CM | POA: Insufficient documentation

## 2024-04-28 DIAGNOSIS — M79672 Pain in left foot: Secondary | ICD-10-CM | POA: Insufficient documentation

## 2024-04-28 DIAGNOSIS — W19XXXA Unspecified fall, initial encounter: Secondary | ICD-10-CM

## 2024-04-28 MED ORDER — KETOROLAC TROMETHAMINE 30 MG/ML IJ SOLN
30.0000 mg | Freq: Once | INTRAMUSCULAR | Status: AC
  Administered 2024-04-28: 30 mg via INTRAMUSCULAR
  Filled 2024-04-28: qty 1

## 2024-04-28 MED ORDER — MELOXICAM 15 MG PO TABS: 15.0000 mg | ORAL_TABLET | Freq: Every day | ORAL | 0 refills | Status: AC

## 2024-04-28 NOTE — ED Provider Notes (Signed)
 [facility] Emergency Department Provider Note     Event Date/Time   First MD Initiated Contact with Patient 04/28/24 1543     (approximate)   History   Fall   HPI  ILIAN Lucas is a 53 y.o. female with a history of HTN presents to the ED for evaluation of left knee and left foot pain following a mechanical fall while x 4 days ago.  Patient reports she tripped over her dog toy at home falling onto her left knee.  She reports pain with ambulation.  Denies head injury and LOC.  No other complaints.    Physical Exam   Triage Vital Signs: ED Triage Vitals  Encounter Vitals Group     BP 04/28/24 1356 (!) 152/98     Systolic BP Percentile --      Diastolic BP Percentile --      Pulse Rate 04/28/24 1356 (!) 106     Resp 04/28/24 1356 17     Temp 04/28/24 1356 98.7 F (37.1 C)     Temp Source 04/28/24 1356 Oral     SpO2 04/28/24 1356 98 %     Weight 04/28/24 1355 127 lb (57.6 kg)     Height 04/28/24 1355 5' 5.5" (1.664 m)     Head Circumference --      Peak Flow --      Pain Score 04/28/24 1355 7     Pain Loc --      Pain Education --      Exclude from Growth Chart --     Most recent vital signs: Vitals:   04/28/24 1356  BP: (!) 152/98  Pulse: (!) 106  Resp: 17  Temp: 98.7 F (37.1 C)  SpO2: 98%    General Awake, no distress.  HEENT NCAT.  CV:  Good peripheral perfusion.  RESP:  Normal effort.  ABD:  No distention. Other:  Left knee reveals no visible deformity.  Tenderness along inferior medial patella border.  Negative valgus and varus test.  Full range of motion with knee flexion and extension.  F ROM of ankle joint.   Left foot reveals no visible deformity.  Tenderness over distal 1st metatarsal bone.  Neurovascular status intact all throughout.  Good capillary refills.  F ROM of all digits.   Gait is steady.   ED Results / Procedures / Treatments   Labs (all labs ordered are listed, but only abnormal results are  displayed) Labs Reviewed - No data to display  RADIOLOGY  I personally viewed and evaluated these images as part of my medical decision making, as well as reviewing the written report by the radiologist.  ED Provider Interpretation: No acute bony abnormality of left foot or left knee confirmed by x-ray.  DG Foot Complete Left Result Date: 04/28/2024 CLINICAL DATA:  Status post fall EXAM: LEFT FOOT - COMPLETE 3+ VIEW COMPARISON:  None Available. FINDINGS: There is no evidence of fracture or dislocation. There is no evidence of arthropathy or other focal bone abnormality. Soft tissues are unremarkable. IMPRESSION: Negative. Electronically Signed   By: Fredrich Jefferson M.D.   On: 04/28/2024 14:57   DG Knee Complete 4 Views Left Result Date: 04/28/2024 CLINICAL DATA:  Left knee pain after fall 4 days ago. EXAM: LEFT KNEE - COMPLETE 4+ VIEW COMPARISON:  None Available. FINDINGS: No evidence of fracture or dislocation. Small suprapatellar joint effusion. No evidence of arthropathy or other focal bone abnormality. Soft tissues are unremarkable. IMPRESSION:  Small suprapatellar joint effusion.  No fracture or dislocation. Electronically Signed   By: Rosalene Colon M.D.   On: 04/28/2024 14:49    PROCEDURES:  Critical Care performed: No  Procedures   MEDICATIONS ORDERED IN ED: Medications  ketorolac  (TORADOL ) 30 MG/ML injection 30 mg (30 mg Intramuscular Given 04/28/24 1639)     IMPRESSION / MDM / ASSESSMENT AND PLAN / ED COURSE  I reviewed the triage vital signs and the nursing notes.                               53 y.o. female presents to the emergency department for evaluation and treatment of fall sustaining left knee and left foot pain. See HPI for further details.   Differential diagnosis includes, but is not limited to fracture, dislocation, sprain, contusion  Patient's presentation is most consistent with acute complicated illness / injury requiring diagnostic workup.  Patient is  alert and oriented.  She is mildly tachycardic at 106 bpm but otherwise hemodynamically stable.  Physical exam findings are reassuring.  ED pain management with Toradol .  Reassuring left knee and foot x-ray.  Patient provided with a cam boot and knee sleeve.  She is encouraged to follow-up with orthopedic if symptoms do not improve in 7 days.  At this time patient is stable in satisfactory condition for discharge home and outpatient management.  ED return precautions discussed.  FINAL CLINICAL IMPRESSION(S) / ED DIAGNOSES   Final diagnoses:  Fall, initial encounter  Acute pain of left knee  Acute foot pain, left   Rx / DC Orders   ED Discharge Orders          Ordered    meloxicam (MOBIC) 15 MG tablet  Daily        04/28/24 1639             Note:  This document was prepared using Dragon voice recognition software and may include unintentional dictation errors.    Phyllis Breeze, Francella Barnett A, PA-C 04/28/24 1943    Shane Darling, MD 04/28/24 [phone]

## 2024-04-28 NOTE — ED Notes (Signed)
 See triage notes. Patient c/o left leg pain from knee to top of the foot for the past four days. Patient fell over a dog toy.

## 2024-04-28 NOTE — Discharge Instructions (Signed)
 You were evaluated in the ED following a fall with left knee and foot pain.  Your x-rays are reassuring and showed no abnormality.  Get plenty of rest.  Apply ice over the affected areas.  Elevate the leg above heart level is much as possible.  Follow-up with orthopedics in 1 week if symptoms do not improve.

## 2024-04-28 NOTE — ED Triage Notes (Signed)
 Patient to ED via POV after a fall 4 days ago. PT reports tripping over a dog toy. C/o left knee and foot pain.
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
## 2024-05-28 ENCOUNTER — Ambulatory Visit (INDEPENDENT_AMBULATORY_CARE_PROVIDER_SITE_OTHER): Payer: MEDICAID | Admitting: Psychiatry

## 2024-05-28 ENCOUNTER — Encounter: Payer: Self-pay | Admitting: Psychiatry

## 2024-05-28 VITALS — BP 126/84 | HR 98 | Ht 65.5 in | Wt 126.2 lb

## 2024-05-28 DIAGNOSIS — G4700 Insomnia, unspecified: Secondary | ICD-10-CM

## 2024-05-28 DIAGNOSIS — Z79899 Other long term (current) drug therapy: Secondary | ICD-10-CM

## 2024-05-28 DIAGNOSIS — F4310 Post-traumatic stress disorder, unspecified: Secondary | ICD-10-CM | POA: Diagnosis not present

## 2024-05-28 DIAGNOSIS — F331 Major depressive disorder, recurrent, moderate: Secondary | ICD-10-CM | POA: Diagnosis not present

## 2024-05-28 DIAGNOSIS — F1020 Alcohol dependence, uncomplicated: Secondary | ICD-10-CM

## 2024-05-28 MED ORDER — PRAZOSIN HCL 2 MG PO CAPS: 2.0000 mg | ORAL_CAPSULE | Freq: Every day | ORAL | 0 refills | Status: DC

## 2024-05-28 MED ORDER — PRAZOSIN HCL 1 MG PO CAPS: 1.0000 mg | ORAL_CAPSULE | Freq: Every day | ORAL | 0 refills | Status: DC

## 2024-05-28 NOTE — Patient Instructions (Signed)
 Continue citalopram  40 mg daily Continue buspar  15 mg twice a day  Start prazosin 1 mg at night for one week, then 2 mg at night  Continue hydroxyzine  50 mg daily as needed for anxiety  Obtain lab (CMP) at [facility] Plan to start naltrexone  50 mg at night after reviewing labs Continue trazodone  50 mg at night as needed for insomnia Next appointment: 7/21 at 11:30

## 2024-06-04 ENCOUNTER — Ambulatory Visit: Payer: MEDICAID | Admitting: Sleep Medicine

## 2024-06-04 ENCOUNTER — Encounter: Payer: Self-pay | Admitting: Sleep Medicine

## 2024-06-04 VITALS — BP 90/78 | HR 87 | Temp 98.7°F | Ht 65.5 in | Wt 123.8 lb

## 2024-06-04 DIAGNOSIS — F17210 Nicotine dependence, cigarettes, uncomplicated: Secondary | ICD-10-CM

## 2024-06-04 DIAGNOSIS — G4733 Obstructive sleep apnea (adult) (pediatric): Secondary | ICD-10-CM | POA: Diagnosis not present

## 2024-06-04 DIAGNOSIS — I10 Essential (primary) hypertension: Secondary | ICD-10-CM

## 2024-06-04 DIAGNOSIS — F5104 Psychophysiologic insomnia: Secondary | ICD-10-CM

## 2024-06-04 DIAGNOSIS — R0602 Shortness of breath: Secondary | ICD-10-CM

## 2024-06-04 DIAGNOSIS — F418 Other specified anxiety disorders: Secondary | ICD-10-CM | POA: Diagnosis not present

## 2024-06-04 DIAGNOSIS — G4700 Insomnia, unspecified: Secondary | ICD-10-CM | POA: Diagnosis not present

## 2024-06-04 DIAGNOSIS — F32A Depression, unspecified: Secondary | ICD-10-CM

## 2024-06-04 NOTE — Progress Notes (Signed)
 Name:Anna Lucas MRN: [id number] DOB: Jun 29, 1971   CHIEF COMPLAINT:  EXCESSIVE DAYTIME SLEEPINESS   HISTORY OF PRESENT ILLNESS:  Anna Lucas is a 53 y.o. w/ a h/o HTN, anxiety and depressions who presents for c/o loud snoring and excessive daytime sleepiness which has been present for several years. Reports nocturnal awakenings due to nocturia, and has difficulty falling back to sleep. Reports a 20 lb weight gain over the last year. Admits to night sweats and dry mouth. Denies morning headaches, RLS symptoms, dream enactment, cataplexy, hypnagogic or hypnapompic hallucinations. Denies a family history of sleep apnea. Denies drowsy driving. Drinks 2 cups of coffee and 1-2 sodas daily, drinks alcohol a few nights per week, smokes 1 ppd tobacco use, denies illicit drug use.   Bedtime 10-11 pm Sleep onset 30 mins Rise time 10 am am   EPWORTH SLEEP SCORE 6    06/04/2024    1:00 PM  Results of the Epworth flowsheet  Sitting and reading 1  Watching TV 1  Sitting, inactive in a public place (e.g. a theatre or a meeting) 0  As a passenger in a car for an hour without a break 1  Lying down to rest in the afternoon when circumstances permit 2  Sitting and talking to someone 0  Sitting quietly after a lunch without alcohol 1  In a car, while stopped for a few minutes in traffic 0  Total score 6    PAST MEDICAL HISTORY :   has a past medical history of Abscessed tooth (01/02/2021), Acid reflux, Acute hepatitis (05/11/2022), Alcohol abuse, Anxiety, Atopic dermatitis, unspecified (12/03/2018), Bilateral primary osteoarthritis of knee (04/25/2023), Depression, Gout (02/17/2021), Hypertension, Hypomagnesemia (05/12/2022), Hyponatremia (05/12/2022), Hypophosphatemia (05/12/2022), Myelosuppression (05/12/2022), and PTSD (post-traumatic stress disorder).  has a past surgical history that includes Cesarean section. Prior to Admission medications   Medication Sig Start Date End Date Taking?  Authorizing Provider  albuterol  (VENTOLIN  HFA) 108 (90 Base) MCG/ACT inhaler Inhale 2 puffs into the lungs every 6 (six) hours as needed for wheezing or shortness of breath. 03/17/24  Yes Hadassah Letters, MD  budeson-glycopyrrolate-formoterol (BREZTRI  AEROSPHERE) 160-9-4.8 MCG/ACT AERO Inhale 2 puffs into the lungs 2 (two) times daily. 03/25/24  Yes Hadassah Letters, MD  busPIRone  (BUSPAR ) 15 MG tablet Take 15 mg by mouth 2 (two) times daily.   Yes [provider]  citalopram  (CELEXA ) 40 MG tablet Take 40 mg by mouth daily. 12/05/20  Yes [provider]  cyanocobalamin  (VITAMIN B12) 1000 MCG/ML injection 1,000 mcg once.   Yes [provider]  folic acid  (FOLVITE ) 1 MG tablet Take 1 tablet (1 mg total) by mouth daily. 03/31/24  Yes Avonne Boettcher, MD  gabapentin  (NEURONTIN ) 300 MG capsule Take 1 capsule (300 mg total) by mouth 3 (three) times daily. 06/28/22  Yes Krishnan, Sendil K, MD  hydrOXYzine  (ATARAX ) 50 MG tablet Take 1 tablet (50 mg total) by mouth 3 (three) times daily as needed. 06/28/22  Yes Krishnan, Sendil K, MD  KETOCONAZOLE , TOPICAL, 1 % SHAM Use once or twice daily in the shower for 2 weeks 03/11/24  Yes Hadassah Letters, MD  lisinopril (ZESTRIL) 10 MG tablet Take 10 mg by mouth daily.   Yes [provider]  prazosin  (MINIPRESS ) 1 MG capsule Take 1 capsule (1 mg total) by mouth at bedtime for 7 days. 05/28/24 06/04/24 Yes Hisada, Ivan Marion, MD  prazosin  (MINIPRESS ) 2 MG capsule Take 1 capsule (2 mg total) by mouth at  bedtime. Start after completing 1 mg at night for 7 days 06/04/24 07/04/24 Yes Hisada, Ivan Marion, MD  Syringe/Needle, Disp, (SYRINGE 3CC/25GX1) 25G X 1 3 ML MISC 1 Syringe by Does not apply route every 30 (thirty) days. 03/17/24  Yes Hadassah Letters, MD  traZODone  (DESYREL ) 50 MG tablet Take 1 tablet (50 mg total) by mouth at bedtime. 06/28/22  Yes Krishnan, Sendil K, MD   Allergies  Allergen Reactions   Prednisone  Swelling    Feet swelling, feeling like  crap     FAMILY HISTORY:  family history includes CAD in her paternal grandfather; Diabetes Mellitus II in her maternal grandmother; Emphysema in her maternal grandmother; Stroke in her paternal grandfather. SOCIAL HISTORY:  reports that she has been smoking cigarettes. She started smoking about 36 years ago. She has a 36.2 pack-year smoking history. She has never used smokeless tobacco. She reports current alcohol use of about 12.0 standard drinks of alcohol per week. She reports that she does not use drugs.   Review of Systems:  Gen:  Denies  fever, sweats, chills weight loss  HEENT: Denies blurred vision, double vision, ear pain, eye pain, hearing loss, nose bleeds, sore throat Cardiac:  No dizziness, chest pain or heaviness, chest tightness,edema, No JVD Resp:   No cough, -sputum production, -shortness of breath,-wheezing, -hemoptysis,  Gi: Denies swallowing difficulty, stomach pain, nausea or vomiting, diarrhea, constipation, bowel incontinence Gu:  Denies bladder incontinence, burning urine Ext:   Denies Joint pain, stiffness or swelling Skin: Denies  skin rash, easy bruising or bleeding or hives Endoc:  Denies polyuria, polydipsia , polyphagia or weight change Psych:   Denies depression, insomnia or hallucinations  Other:  All other systems negative  VITAL SIGNS: BP 90/78 (BP Location: Left Arm, Patient Position: Sitting, Cuff Size: Normal)   Pulse 87   Temp 98.7 F (37.1 C) (Oral)   Ht 5' 5.5 (1.664 m)   Wt 123 lb 12.8 oz (56.2 kg)   LMP 07/24/2016   SpO2 98%   BMI 20.29 kg/m    Physical Examination:   General Appearance: No distress  EYES PERRLA, EOM intact.   NECK Supple, No JVD Pulmonary: normal breath sounds, No wheezing.  CardiovascularNormal S1,S2.  No m/r/g.   Abdomen: Benign, Soft, non-tender. Skin:   warm, no rashes, no ecchymosis  Extremities: normal, no cyanosis, clubbing. Neuro:without focal findings,  speech normal  PSYCHIATRIC: Mood, affect  within normal limits.   ASSESSMENT AND PLAN  OSA I suspect that OSA is likely present due to clinical presentation. Discussed the consequences of untreated sleep apnea. Advised not to drive drowsy for safety of patient and others. Will complete further evaluation with a home sleep study and follow up to review results.    HTN Stable, on current management. Following with PCP.   Anxiety and depression Stable, on current management. Following with PCP.  Insomnia Counseled patient on stimulus control and improving sleep hygiene practices.   Shortness of breath I suspect that patient has underlying COPD, will complete order for PFT and referral to Pulmonary for further evaluation and management.    MEDICATION ADJUSTMENTS/LABS AND TESTS ORDERED: Recommend Sleep Study   Patient  satisfied with Plan of action and management. All questions answered  Follow up to review HST results and treatment plan.   I spent a total of 46 minutes reviewing chart data, face-to-face evaluation with the patient, counseling and coordination of care as detailed above.    Mckinsey Keagle, M.D.  Sleep Medicine [facility]

## 2024-06-04 NOTE — Patient Instructions (Signed)
 Anna Lucas

## 2024-06-12 ENCOUNTER — Ambulatory Visit (INDEPENDENT_AMBULATORY_CARE_PROVIDER_SITE_OTHER): Payer: MEDICAID | Admitting: Pediatrics

## 2024-06-12 VITALS — BP 110/72 | HR 112 | Temp 98.7°F | Wt 124.4 lb

## 2024-06-12 DIAGNOSIS — R000 Tachycardia, unspecified: Secondary | ICD-10-CM | POA: Diagnosis not present

## 2024-06-12 DIAGNOSIS — Z111 Encounter for screening for respiratory tuberculosis: Secondary | ICD-10-CM

## 2024-06-12 DIAGNOSIS — F32A Depression, unspecified: Secondary | ICD-10-CM

## 2024-06-12 DIAGNOSIS — F419 Anxiety disorder, unspecified: Secondary | ICD-10-CM

## 2024-06-12 DIAGNOSIS — J449 Chronic obstructive pulmonary disease, unspecified: Secondary | ICD-10-CM | POA: Diagnosis not present

## 2024-06-12 DIAGNOSIS — R0602 Shortness of breath: Secondary | ICD-10-CM

## 2024-06-12 DIAGNOSIS — R232 Flushing: Secondary | ICD-10-CM

## 2024-06-12 DIAGNOSIS — L219 Seborrheic dermatitis, unspecified: Secondary | ICD-10-CM | POA: Diagnosis not present

## 2024-06-12 DIAGNOSIS — J329 Chronic sinusitis, unspecified: Secondary | ICD-10-CM

## 2024-06-12 DIAGNOSIS — I10 Essential (primary) hypertension: Secondary | ICD-10-CM

## 2024-06-12 MED ORDER — KETOCONAZOLE 1 % EX SHAM: MEDICATED_SHAMPOO | CUTANEOUS | 0 refills | Status: AC

## 2024-06-12 MED ORDER — MONTELUKAST SODIUM 10 MG PO TABS: 10.0000 mg | ORAL_TABLET | Freq: Every day | ORAL | 3 refills | Status: DC

## 2024-06-12 MED ORDER — LISINOPRIL 10 MG PO TABS: 10.0000 mg | ORAL_TABLET | Freq: Every day | ORAL | 3 refills | Status: DC

## 2024-06-12 MED ORDER — PAROXETINE HCL 20 MG PO TABS: 20.0000 mg | ORAL_TABLET | Freq: Every day | ORAL | 2 refills | Status: DC

## 2024-06-12 NOTE — Patient Instructions (Addendum)
 I sent refills.  Stop citalopram  40mg  and switch to paroxetine (paxil) 20mg  daily.  Please seek medical attention in the Emergency Department immediately if you develop persistant or increased chest pain, shortness of breath, fainting spells, sudden sweatiness, pain radiating to your back, coughing up blood, leg swelling, fevers greater than 100.48F that do not resolve with Tylenol , or any other symptoms that concern you.

## 2024-06-12 NOTE — Progress Notes (Signed)
 Office Visit  BP 110/72   Pulse (!) 112   Temp 98.7 F (37.1 C) (Oral)   Wt 124 lb 6.4 oz (56.4 kg)   LMP 07/24/2016   SpO2 93%   BMI 20.39 kg/m    Subjective:    Patient ID: Anna Lucas, female    DOB: 1971-04-06, 53 y.o.   MRN: [id number]  HPI: Anna Lucas is a 52 y.o. female  Chief Complaint  Patient presents with   Hypertension    Discussed the use of AI scribe software for clinical note transcription with the patient, who gave verbal consent to proceed.  History of Present Illness   Anna Lucas is a 53 year old female who presents for a TB test required for work, evaluation of shortness of breath, and medication refills.  She experiences shortness of breath and wheezing, which she attributes to being out of her Breztri  inhaler for over a week. She also notes a sensation of tachycardia and requires refills for albuterol  and lisinopril . Excessive clear nasal drainage contributes to her breathlessness, and she frequently needs to blow her nose.  She has sores in her ears and dry, scabby skin. She has been using [employer] Should[employer] shampoo for this condition but was supposed to receive a medicated shampoo that was not filled at the pharmacy.  She experiences severe hot flashes, describing them as embarrassing and constant. She is currently on citalopram  and recently started a higher dose of Proscar, but is unsure if it helps with the hot flashes.  She has a history of anxiety and depression, feeling particularly down due to not working. She is starting a new job on Monday and hopes this will improve her mood. She has been prescribed naltrexone  for urges related to alcohol use and requests a refill.  She is scheduled to have blood work done as requested by her psychiatrist to evaluate her suitability for a new medication. She is also going to have a sleep apnea test at home.        Relevant past medical, surgical, family and social history reviewed and updated  as indicated. Interim medical history since our last visit reviewed. Allergies and medications reviewed and updated.  ROS per HPI unless specifically indicated above     Objective:    BP 110/72   Pulse (!) 112   Temp 98.7 F (37.1 C) (Oral)   Wt 124 lb 6.4 oz (56.4 kg)   LMP 07/24/2016   SpO2 93%   BMI 20.39 kg/m   Wt Readings from Last 3 Encounters:  06/18/24 126 lb (57.2 kg)  06/12/24 124 lb 6.4 oz (56.4 kg)  06/04/24 123 lb 12.8 oz (56.2 kg)     Physical Exam Constitutional:      Appearance: Normal appearance.  HENT:     Head: Normocephalic and atraumatic.   Eyes:     Pupils: Pupils are equal, round, and reactive to light.    Cardiovascular:     Rate and Rhythm: Normal rate and regular rhythm.     Pulses: Normal pulses.     Heart sounds: Normal heart sounds.  Pulmonary:     Effort: Pulmonary effort is normal.     Breath sounds: Normal breath sounds.   Musculoskeletal:        General: Normal range of motion.     Cervical back: Normal range of motion.   Skin:    General: Skin is warm and dry.     Capillary Refill:  Capillary refill takes less than 2 seconds.   Neurological:     General: No focal deficit present.     Mental Status: She is alert. Mental status is at baseline.   Psychiatric:        Mood and Affect: Mood is anxious.        Behavior: Behavior normal.         06/12/2024   10:37 AM 05/28/2024    2:29 PM 03/11/2024   10:24 AM 07/10/2022   10:38 AM 07/03/2022    9:10 AM  Depression screen PHQ 2/9  Decreased Interest 1  0 0 0  Down, Depressed, Hopeless 1  0 0 0  PHQ - 2 Score 2  0 0 0  Altered sleeping 1  0    Tired, decreased energy 1  0    Change in appetite 1  0    Feeling bad or failure about yourself  1  0    Trouble concentrating 0  0    Moving slowly or fidgety/restless 0  0    Suicidal thoughts 0  0    PHQ-9 Score 6  0    Difficult doing work/chores Not difficult at all         Information is confidential and restricted. Go to  Review Flowsheets to unlock data.       06/12/2024   10:37 AM 05/28/2024    2:30 PM 03/11/2024   10:24 AM  GAD 7 : Generalized Anxiety Score  Nervous, Anxious, on Edge 1  1  Control/stop worrying 1  1  Worry too much - different things 1  0  Trouble relaxing 0  0  Restless 0  0  Easily annoyed or irritable 0  0  Afraid - awful might happen 0  0  Total GAD 7 Score 3  2  Anxiety Difficulty Not difficult at all       Information is confidential and restricted. Go to Review Flowsheets to unlock data.       Assessment & Plan:  Assessment & Plan   Chronic obstructive pulmonary disease, unspecified COPD type (HCC) Shortness of breath Tachycardia Assessment & Plan: Shortness of breath and wheezing consistent with COPD. High heart rate likely anxiety-related. EKG normal. Plan on blood work and find better COPD regimen. - Provide Breztri  inhaler sample. - Resend Breztri  prescription to pharmacy. - Ensure albuterol  inhaler refill. -     Brain natriuretic peptide -     D-dimer, quantitative -     EKG 12-Lead   Screening-pulmonary TB Requires TB test for work purposes. - Perform TB test. - Submit TB test results to workplace if required. -     QuantiFERON-TB Gold Plus  Seborrheic dermatitis Assessment & Plan: Sores in ears and dry scalp consistent with seborrheic dermatitis. Requires medicated shampoo. - Resend prescription for medicated shampoo to pharmacy.  Orders: -     Ketoconazole ; Use once or twice daily in the shower for 2 weeks  Dispense: 125 mL; Refill: 0  Primary hypertension Assessment & Plan: Requires lisinopril  refill for ongoing management. - Refill lisinopril  prescription.  Orders: -     Comprehensive metabolic panel with GFR -     Lisinopril ; Take 1 tablet (10 mg total) by mouth daily.  Dispense: 90 tablet; Refill: 3  Chronic sinusitis, unspecified location Assessment & Plan: Frequent nasal discharge affecting breathing, consistent with allergic  rhinitis. Agreed to start montelukast . - Prescribe montelukast .  Orders: -     Montelukast  Sodium;  Take 1 tablet (10 mg total) by mouth at bedtime.  Dispense: 30 tablet; Refill: 3  Anxiety and depression Hot flashes Assessment & Plan: Depression and anxiety exacerbated by job changes and medication adjustments. Citalopram  ineffective. Severe hot flashes possibly related to psychiatric medication. Agreed to switch to Paxil . Declined gynecology referral. - Switch from citalopram  to Paxil . - Refill naltrexone  for alcohol use disorder. - Ensure setup with a therapist. - Follow up on psychiatrist's recommendations. -     PARoxetine  HCl; Take 1 tablet (20 mg total) by mouth daily.  Dispense: 30 tablet; Refill: 2  Follow up plan: Return in about 4 weeks (around 07/10/2024) for Chronic illness f/u.  Anna SHAUNNA Nett, MD

## 2024-06-15 ENCOUNTER — Other Ambulatory Visit: Payer: MEDICAID

## 2024-06-16 ENCOUNTER — Telehealth: Payer: Self-pay

## 2024-06-16 ENCOUNTER — Ambulatory Visit: Payer: Self-pay | Admitting: Pediatrics

## 2024-06-16 NOTE — Telephone Encounter (Signed)
 Attempted to reach patient No answer   Okay for e2c2 to go over with patient if call is returned

## 2024-06-16 NOTE — Telephone Encounter (Signed)
 Copied from CRM [id number]. Topic: Clinical - Request for Lab/Test Order >> Jun 15, 2024  3:10 PM Turkey B wrote: Reason for CRM: pt called in about getting copy of tb skin test lab results. Results don't show in yet. Pease cb when results are in  Please advise?

## 2024-06-17 ENCOUNTER — Other Ambulatory Visit: Payer: MEDICAID

## 2024-06-17 ENCOUNTER — Ambulatory Visit: Payer: Self-pay

## 2024-06-17 NOTE — Telephone Encounter (Signed)
 FYI Only or Action Required?: FYI only for provider.  Patient was last seen in primary care on 06/12/2024 by Herold Hadassah SQUIBB, MD. Called Nurse Triage reporting Back Pain. Symptoms began several months ago. Interventions attempted: Prescription medications: Meloxicam  and Ice/heat application. Symptoms are: lower back spasms, pain alternates down legs, feet and hands with tingling and cramps, upper back pain near bra line, neck paingradually worsening.  Triage Disposition: See Physician Within 24 Hours  Patient/caregiver understands and will follow disposition?: Yes                         Copied from CRM [id number]. Topic: Clinical - Red Word Triage >> Jun 17, 2024  1:00 PM Selinda RAMAN wrote: Red Word that prompted transfer to Nurse Triage: The patient called in stating she has been having severe back pain for several days. I will transfer her to E2C2 NT Reason for Disposition  Numbness in a leg or foot (i.e., loss of sensation)  Answer Assessment - Initial Assessment Questions 1. ONSET: When did the pain begin?      X months.  2. LOCATION: Where does it hurt? (upper, mid or lower back)     Upper near bra line across both sides and lower back spasms.  3. SEVERITY: How bad is the pain?  (e.g., Scale 1-10; mild, moderate, or severe)   - MILD (1-3): Doesn't interfere with normal activities.    - MODERATE (4-7): Interferes with normal activities or awakens from sleep.    - SEVERE (8-10): Excruciating pain, unable to do any normal activities.      8/10. She states she has not taken anything for pain yet.  4. PATTERN: Is the pain constant? (e.g., yes, no; constant, intermittent)      Constant. Gradually worsening.  5. RADIATION: Does the pain shoot into your legs or somewhere else?     She states it varies it can go down either side but not both at the same time.  6. CAUSE:  What do you think is causing the back pain?      She states she has seen [facility] in  the past for neck and knee pain. She was diagnosed with arthritis.  7. BACK OVERUSE:  Any recent lifting of heavy objects, strenuous work or exercise?     No.  8. MEDICINES: What have you taken so far for the pain? (e.g., nothing, acetaminophen , NSAIDS)     Meloxicam  and using heating pad.  9. NEUROLOGIC SYMPTOMS: Do you have any weakness, numbness, or problems with bowel/bladder control?     Patient states sometimes her hands and feet will have tingling. She states sometimes she will have cramps in her feet and fingers. Patient denies loss of bowel or bladder control.  10. OTHER SYMPTOMS: Do you have any other symptoms? (e.g., fever, abdomen pain, burning with urination, blood in urine)       Neck pain. Patient denies abdominal pain or fever.  11. PREGNANCY: Is there any chance you are pregnant? When was your last menstrual period?       N/A.  Protocols used: Back Pain-A-AH

## 2024-06-18 ENCOUNTER — Encounter: Payer: Self-pay | Admitting: Pediatrics

## 2024-06-18 ENCOUNTER — Ambulatory Visit: Payer: MEDICAID | Admitting: Pediatrics

## 2024-06-18 ENCOUNTER — Telehealth: Payer: Self-pay

## 2024-06-18 VITALS — BP 110/77 | HR 92 | Temp 98.3°F | Ht 65.5 in | Wt 126.0 lb

## 2024-06-18 DIAGNOSIS — M7989 Other specified soft tissue disorders: Secondary | ICD-10-CM

## 2024-06-18 DIAGNOSIS — M542 Cervicalgia: Secondary | ICD-10-CM | POA: Diagnosis not present

## 2024-06-18 DIAGNOSIS — I10 Essential (primary) hypertension: Secondary | ICD-10-CM

## 2024-06-18 LAB — COMPREHENSIVE METABOLIC PANEL WITH GFR
ALT: 40 IU/L — ABNORMAL HIGH (ref 0–32)
AST: 60 IU/L — ABNORMAL HIGH (ref 0–40)
Albumin: 4.6 g/dL (ref 3.9–4.9)
Alkaline Phosphatase: 107 IU/L (ref 44–121)
BUN/Creatinine Ratio: 14 (ref 9–23)
BUN: 18 mg/dL (ref 6–24)
Bilirubin Total: 0.2 mg/dL (ref 0.0–1.2)
CO2: 21 mmol/L (ref 20–29)
Calcium: 10 mg/dL (ref 8.7–10.2)
Chloride: 97 mmol/L (ref 96–106)
Creatinine, Ser: 1.25 mg/dL — ABNORMAL HIGH (ref 0.57–1.00)
Globulin, Total: 2.7 g/dL (ref 1.5–4.5)
Glucose: 63 mg/dL — ABNORMAL LOW (ref 70–99)
Potassium: 4.3 mmol/L (ref 3.5–5.2)
Sodium: 135 mmol/L (ref 134–144)
Total Protein: 7.3 g/dL (ref 6.0–8.5)
eGFR: 53 mL/min/{1.73_m2} — ABNORMAL LOW (ref >59)

## 2024-06-18 LAB — QUANTIFERON-TB GOLD PLUS
QuantiFERON Nil Value: 0.19 [IU]/mL
QuantiFERON TB1 Ag Value: 0.09 [IU]/mL
QuantiFERON TB2 Ag Value: 0.1 [IU]/mL

## 2024-06-18 LAB — D-DIMER, QUANTITATIVE: D-DIMER: 0.42 mg{FEU}/L (ref 0.00–0.49)

## 2024-06-18 LAB — BRAIN NATRIURETIC PEPTIDE: BNP: 13.6 pg/mL (ref 0.0–100.0)
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
## 2024-06-18 NOTE — Telephone Encounter (Signed)
 Patient has a appointment in office today with provider

## 2024-06-18 NOTE — Patient Instructions (Addendum)
 For shock wave therapy call [facility]: Phone: [phone]  I referred you to neck evaluation as well with Dr. Shellia  I placed and order an ultrasound

## 2024-06-18 NOTE — Telephone Encounter (Signed)
 Copied from CRM [id number]. Topic: Referral - Question >> Jun 18, 2024  2:18 PM Sasha H wrote: Reason for CRM: Pt states the chiropractor that she was referred to does not take her insurance and she is requesting a referral to one that does.  Are we able to assist with this?

## 2024-06-19 LAB — BASIC METABOLIC PANEL WITH GFR
BUN/Creatinine Ratio: 15 (ref 9–23)
BUN: 21 mg/dL (ref 6–24)
CO2: 19 mmol/L — ABNORMAL LOW (ref 20–29)
Calcium: 9.8 mg/dL (ref 8.7–10.2)
Chloride: 96 mmol/L (ref 96–106)
Creatinine, Ser: 1.44 mg/dL — ABNORMAL HIGH (ref 0.57–1.00)
Glucose: 82 mg/dL (ref 70–99)
Potassium: 4.1 mmol/L (ref 3.5–5.2)
Sodium: 134 mmol/L (ref 134–144)
eGFR: 44 mL/min/{1.73_m2} — ABNORMAL LOW (ref >59)

## 2024-06-21 ENCOUNTER — Encounter: Payer: Self-pay | Admitting: Pediatrics

## 2024-06-21 DIAGNOSIS — J329 Chronic sinusitis, unspecified: Secondary | ICD-10-CM | POA: Insufficient documentation

## 2024-06-21 NOTE — Assessment & Plan Note (Signed)
 Depression and anxiety exacerbated by job changes and medication adjustments. Citalopram  ineffective. Severe hot flashes possibly related to psychiatric medication. Agreed to switch to Paxil . Declined gynecology referral. - Switch from citalopram  to Paxil . - Refill naltrexone  for alcohol use disorder. - Ensure setup with a therapist. - Follow up on psychiatrist's recommendations.

## 2024-06-21 NOTE — Assessment & Plan Note (Signed)
 Frequent nasal discharge affecting breathing, consistent with allergic rhinitis. Agreed to start montelukast . - Prescribe montelukast .

## 2024-06-21 NOTE — Assessment & Plan Note (Signed)
 Sores in ears and dry scalp consistent with seborrheic dermatitis. Requires medicated shampoo. - Resend prescription for medicated shampoo to pharmacy.

## 2024-06-21 NOTE — Assessment & Plan Note (Signed)
 Requires lisinopril  refill for ongoing management. - Refill lisinopril  prescription.

## 2024-06-21 NOTE — Assessment & Plan Note (Addendum)
 Shortness of breath and wheezing consistent with COPD. High heart rate likely anxiety-related. EKG normal.  - Provide Breztri  inhaler sample. - Resend Breztri  prescription to pharmacy. - Ensure albuterol  inhaler refill.

## 2024-06-22 ENCOUNTER — Encounter: Payer: Self-pay | Admitting: Pediatrics

## 2024-06-22 NOTE — Assessment & Plan Note (Signed)
 Will repeat blood work today. At goal.

## 2024-06-23 ENCOUNTER — Telehealth: Payer: Self-pay | Admitting: Sleep Medicine

## 2024-06-23 NOTE — Telephone Encounter (Signed)
 Dr. Jess saw this patient on 06/04/24 and place an order for home sleep study. The patient has [facility] and they have denied her doing the home sleep study due to not having 4 of the 6 symptoms. She only meets 2 of the 6

## 2024-06-24 ENCOUNTER — Encounter: Payer: Self-pay | Admitting: Physical Medicine and Rehabilitation

## 2024-06-29 ENCOUNTER — Ambulatory Visit: Payer: Self-pay | Admitting: Pediatrics

## 2024-06-29 ENCOUNTER — Ambulatory Visit
Admission: RE | Admit: 2024-06-29 | Discharge: 2024-06-29 | Disposition: A | Discharge status: Home / Self Care | Payer: MEDICAID | Source: Ambulatory Visit | Attending: Pediatrics | Admitting: Pediatrics

## 2024-06-29 DIAGNOSIS — M7989 Other specified soft tissue disorders: Secondary | ICD-10-CM | POA: Insufficient documentation

## 2024-06-30 ENCOUNTER — Inpatient Hospital Stay: Payer: MEDICAID | Attending: Oncology
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
## 2024-07-13 ENCOUNTER — Encounter: Payer: Self-pay | Admitting: Psychiatry

## 2024-07-13 ENCOUNTER — Other Ambulatory Visit: Payer: Self-pay

## 2024-07-13 ENCOUNTER — Ambulatory Visit (INDEPENDENT_AMBULATORY_CARE_PROVIDER_SITE_OTHER): Payer: MEDICAID | Admitting: Psychiatry

## 2024-07-13 VITALS — BP 90/62 | HR 84 | Temp 97.6°F | Ht 65.5 in | Wt 128.8 lb

## 2024-07-13 DIAGNOSIS — F4310 Post-traumatic stress disorder, unspecified: Secondary | ICD-10-CM | POA: Diagnosis not present

## 2024-07-13 DIAGNOSIS — Z79899 Other long term (current) drug therapy: Secondary | ICD-10-CM

## 2024-07-13 DIAGNOSIS — F331 Major depressive disorder, recurrent, moderate: Secondary | ICD-10-CM | POA: Diagnosis not present

## 2024-07-13 DIAGNOSIS — F1020 Alcohol dependence, uncomplicated: Secondary | ICD-10-CM

## 2024-07-13 DIAGNOSIS — G4700 Insomnia, unspecified: Secondary | ICD-10-CM

## 2024-07-13 MED ORDER — PRAZOSIN HCL 1 MG PO CAPS
1.0000 mg | ORAL_CAPSULE | Freq: Every day | ORAL | 1 refills | Status: DC
Start: 2024-07-13 — End: 2024-08-18

## 2024-07-13 MED ORDER — VENLAFAXINE HCL ER 37.5 MG PO CP24: 37.5000 mg | ORAL_CAPSULE | Freq: Every day | ORAL | 0 refills | Status: DC

## 2024-07-13 MED ORDER — VENLAFAXINE HCL ER 75 MG PO CP24: 75.0000 mg | ORAL_CAPSULE | Freq: Every day | ORAL | 1 refills | Status: DC

## 2024-07-13 NOTE — Patient Instructions (Signed)
 Discontinue Paxil  Start venlafaxine  37.5 mg daily for one week, then 75 mg daily  Decrease prazosin  1 mg at night  Continue hydroxyzine  50 mg daily as needed for anxiety  Continue trazodone  50 mg at night as needed for insomnia Next appointment: 8/26 at 3:30

## 2024-07-15 ENCOUNTER — Other Ambulatory Visit: Payer: Self-pay | Admitting: Physical Medicine & Rehabilitation

## 2024-07-15 ENCOUNTER — Ambulatory Visit: Payer: MEDICAID | Admitting: Pulmonary Disease

## 2024-07-15 ENCOUNTER — Encounter: Payer: Self-pay | Admitting: Physical Medicine & Rehabilitation

## 2024-07-15 DIAGNOSIS — M546 Pain in thoracic spine: Secondary | ICD-10-CM

## 2024-07-15 DIAGNOSIS — M542 Cervicalgia: Secondary | ICD-10-CM

## 2024-07-16 ENCOUNTER — Encounter: Payer: Self-pay | Admitting: Physical Medicine & Rehabilitation

## 2024-07-18 ENCOUNTER — Ambulatory Visit
Admission: RE | Admit: 2024-07-18 | Discharge: 2024-07-18 | Disposition: A | Discharge status: Home / Self Care | Payer: MEDICAID | Source: Ambulatory Visit | Attending: Physical Medicine & Rehabilitation | Admitting: Physical Medicine & Rehabilitation

## 2024-07-18 DIAGNOSIS — M542 Cervicalgia: Secondary | ICD-10-CM

## 2024-07-18 DIAGNOSIS — M546 Pain in thoracic spine: Secondary | ICD-10-CM

## 2024-07-20 ENCOUNTER — Ambulatory Visit: Payer: MEDICAID | Admitting: Pediatrics

## 2024-07-20 ENCOUNTER — Encounter: Payer: Self-pay | Admitting: Pediatrics

## 2024-07-20 VITALS — BP 127/90 | HR 106 | Temp 99.0°F | Wt 128.8 lb

## 2024-07-20 DIAGNOSIS — J329 Chronic sinusitis, unspecified: Secondary | ICD-10-CM

## 2024-07-20 DIAGNOSIS — F32A Depression, unspecified: Secondary | ICD-10-CM

## 2024-07-20 DIAGNOSIS — G8929 Other chronic pain: Secondary | ICD-10-CM

## 2024-07-20 DIAGNOSIS — F419 Anxiety disorder, unspecified: Secondary | ICD-10-CM

## 2024-07-20 DIAGNOSIS — R229 Localized swelling, mass and lump, unspecified: Secondary | ICD-10-CM

## 2024-07-20 DIAGNOSIS — M25522 Pain in left elbow: Secondary | ICD-10-CM

## 2024-07-20 DIAGNOSIS — I10 Essential (primary) hypertension: Secondary | ICD-10-CM | POA: Diagnosis not present

## 2024-07-20 DIAGNOSIS — M545 Low back pain, unspecified: Secondary | ICD-10-CM

## 2024-07-20 LAB — MICROALBUMIN, URINE WAIVED
Creatinine, Urine Waived: 200 mg/dL (ref 10–300)
Microalb, Ur Waived: 80 mg/L — ABNORMAL HIGH (ref 0–19)

## 2024-07-20 MED ORDER — HYDROXYZINE HCL 50 MG PO TABS: 50.0000 mg | ORAL_TABLET | Freq: Three times a day (TID) | ORAL | 0 refills | Status: DC | PRN

## 2024-07-20 MED ORDER — MONTELUKAST SODIUM 10 MG PO TABS: 10.0000 mg | ORAL_TABLET | Freq: Every day | ORAL | 3 refills | Status: DC

## 2024-07-20 NOTE — Progress Notes (Signed)
 Office Visit  BP (!) 127/90   Pulse (!) 106   Temp 99 F (37.2 C) (Oral)   Wt 128 lb 12.8 oz (58.4 kg)   LMP 07/24/2016   SpO2 99%   BMI 21.11 kg/m    Subjective:    Patient ID: Anna Lucas, female    DOB: April 15, 1971, 53 y.o.   MRN: [id number]  HPI: Anna Lucas is a 53 y.o. female  No chief complaint on file.   Discussed the use of AI scribe software for clinical note transcription with the patient, who gave verbal consent to proceed.  History of Present Illness   Anna Lucas is a 53 year old female who presents with shortness of breath and concerns about a calcified nodule on her leg.  She experiences shortness of breath, particularly in hot weather, describing it as feeling 'out of breath' and 'like I'm wheezing or something.' The sensation is rough and intermittent, improving in the morning. Her heart rate has been high, and she has not taken her medication today. She stays hydrated by drinking water  throughout the day.  She has a calcified nodule on her leg, which was identified in a CT scan in 2023 and she recently had an ultrasound and MRI ordered for further evaluation. The nodule is bothersome, especially when lying on that side for too long. An MRI was ordered, but the results are pending.  She is currently on several medications including paroxetine , which she was advised to stop, and venlafaxine , which she is supposed to start. She expresses confusion about the medication instructions, particularly regarding the dosage of venlafaxine . She also takes montelukast  and hydroxyzine , and she is running low on these medications.  Her blood pressure was low during a recent visit to the psychiatrist, but it is currently high. She does not monitor her blood pressure at home.  She experiences intermittent pain in her elbow, which she describes as hurting and radiating around the area. It does not occur every day but is bothersome when it does.        Relevant  past medical, surgical, family and social history reviewed and updated as indicated. Interim medical history since our last visit reviewed. Allergies and medications reviewed and updated.  ROS per HPI unless specifically indicated above     Objective:    BP (!) 127/90   Pulse (!) 106   Temp 99 F (37.2 C) (Oral)   Wt 128 lb 12.8 oz (58.4 kg)   LMP 07/24/2016   SpO2 99%   BMI 21.11 kg/m   Wt Readings from Last 3 Encounters:  07/20/24 128 lb 12.8 oz (58.4 kg)  06/18/24 126 lb (57.2 kg)  06/12/24 124 lb 6.4 oz (56.4 kg)     Physical Exam      07/20/2024    2:13 PM 06/12/2024   10:37 AM 05/28/2024    2:29 PM 03/11/2024   10:24 AM 07/10/2022   10:38 AM  Depression screen PHQ 2/9  Decreased Interest 0 1  0 0  Down, Depressed, Hopeless 1 1  0 0  PHQ - 2 Score 1 2  0 0  Altered sleeping 1 1  0   Tired, decreased energy 1 1  0   Change in appetite 0 1  0   Feeling bad or failure about yourself  1 1  0   Trouble concentrating 0 0  0   Moving slowly or fidgety/restless 1 0  0   Suicidal thoughts  0 0  0   PHQ-9 Score 5 6  0   Difficult doing work/chores  Not difficult at all        Information is confidential and restricted. Go to Review Flowsheets to unlock data.       07/20/2024    2:14 PM 06/12/2024   10:37 AM 05/28/2024    2:30 PM 03/11/2024   10:24 AM  GAD 7 : Generalized Anxiety Score  Nervous, Anxious, on Edge 1 1  1   Control/stop worrying 1 1  1   Worry too much - different things 1 1  0  Trouble relaxing 0 0  0  Restless 0 0  0  Easily annoyed or irritable 0 0  0  Afraid - awful might happen 0 0  0  Total GAD 7 Score 3 3  2   Anxiety Difficulty  Not difficult at all       Information is confidential and restricted. Go to Review Flowsheets to unlock data.       Assessment & Plan:  Assessment & Plan   Primary hypertension Assessment & Plan: Her blood pressure was elevated during the visit, and she does not monitor it at home. Blood work and a urine sample are  needed to assess kidney function and check for proteinuria. A home blood pressure cuff will be arranged for monitoring, and necessary tests will be ordered.  Orders: -     Basic metabolic panel with GFR -     Microalbumin, Urine Waived  Chronic sinusitis, unspecified location Assessment & Plan: Requesting singulair  refills.   Orders: -     Montelukast  Sodium; Take 1 tablet (10 mg total) by mouth at bedtime.  Dispense: 30 tablet; Refill: 3  Anxiety and depression Assessment & Plan: Anxiety is contributing to her elevated heart rate and blood pressure. A psychiatrist advised switching from paroxetine  to venlafaxine . Paroxetine  will be discontinued, and venlafaxine  will be started. Hydroxyzine  will be refilled for anxiety management.  Orders: -     hydrOXYzine  HCl; Take 1 tablet (50 mg total) by mouth 3 (three) times daily as needed.  Dispense: 30 tablet; Refill: 0  Calcified nodule Assessment & Plan: Ultrasound confirmed a calcified nodule on her leg, causing discomfort. It is not harmful, but removal will be considered if discomfort persists. She will be referred to orthopedics or general surgery for evaluation and potential removal.   Chronic low back pain, unspecified back pain laterality, unspecified whether sciatica present Assessment & Plan: Her chronic back pain is not well managed by meloxicam , and discontinuation is suggested. Venlafaxine  may help with both anxiety and back pain. Alternative providers for shockwave therapy will be explored due to insurance constraints.   Left elbow pain She experiences intermittent elbow pain that worsens with pressure. It is not constant but bothersome. Over-the-counter diclofenac (Voltaren) gel is recommended.    Follow up plan: Return in about 2 months (around 09/20/2024) for Chronic illness f/u.  Hadassah SHAUNNA Nett, MD

## 2024-07-20 NOTE — Patient Instructions (Signed)
 Diclofenac gel (voltaren gel) as needed for your elbow.

## 2024-07-21 ENCOUNTER — Ambulatory Visit: Payer: Self-pay | Admitting: Pediatrics

## 2024-07-21 LAB — BASIC METABOLIC PANEL WITH GFR
BUN/Creatinine Ratio: 13 (ref 9–23)
BUN: 11 mg/dL (ref 6–24)
CO2: 25 mmol/L (ref 20–29)
Calcium: 10.3 mg/dL — ABNORMAL HIGH (ref 8.7–10.2)
Chloride: 96 mmol/L (ref 96–106)
Creatinine, Ser: 0.82 mg/dL (ref 0.57–1.00)
Glucose: 101 mg/dL — ABNORMAL HIGH (ref 70–99)
Potassium: 4 mmol/L (ref 3.5–5.2)
Sodium: 139 mmol/L (ref 134–144)
eGFR: 87 mL/min/1.73 (ref >59)

## 2024-07-26 ENCOUNTER — Encounter: Payer: Self-pay | Admitting: Pediatrics

## 2024-07-26 DIAGNOSIS — R229 Localized swelling, mass and lump, unspecified: Secondary | ICD-10-CM | POA: Insufficient documentation

## 2024-07-26 DIAGNOSIS — G8929 Other chronic pain: Secondary | ICD-10-CM | POA: Insufficient documentation

## 2024-07-26 NOTE — Assessment & Plan Note (Signed)
 Her blood pressure was elevated during the visit, and she does not monitor it at home. Blood work and a urine sample are needed to assess kidney function and check for proteinuria. A home blood pressure cuff will be arranged for monitoring, and necessary tests will be ordered.

## 2024-07-26 NOTE — Assessment & Plan Note (Signed)
 Her chronic back pain is not well managed by meloxicam , and discontinuation is suggested. Venlafaxine  may help with both anxiety and back pain. Alternative providers for shockwave therapy will be explored due to insurance constraints.

## 2024-07-26 NOTE — Assessment & Plan Note (Signed)
 Anxiety is contributing to her elevated heart rate and blood pressure. A psychiatrist advised switching from paroxetine  to venlafaxine . Paroxetine  will be discontinued, and venlafaxine  will be started. Hydroxyzine  will be refilled for anxiety management.

## 2024-07-26 NOTE — Assessment & Plan Note (Signed)
 Ultrasound confirmed a calcified nodule on her leg, causing discomfort. It is not harmful, but removal will be considered if discomfort persists. She will be referred to orthopedics or general surgery for evaluation and potential removal.

## 2024-07-26 NOTE — Assessment & Plan Note (Signed)
 Requesting singulair  refills.

## 2024-08-04 ENCOUNTER — Other Ambulatory Visit: Payer: Self-pay | Admitting: Pediatrics

## 2024-08-04 DIAGNOSIS — F32A Depression, unspecified: Secondary | ICD-10-CM

## 2024-08-06 ENCOUNTER — Other Ambulatory Visit: Payer: Self-pay | Admitting: Pediatrics

## 2024-08-06 DIAGNOSIS — F32A Depression, unspecified: Secondary | ICD-10-CM

## 2024-08-06 NOTE — Telephone Encounter (Signed)
 Copied from CRM #[id number]. Topic: Clinical - Medication Refill >> Aug 06, 2024 12:42 PM Sophia H wrote: Medication: traZODone  (DESYREL ) tablet 100 mg   gabapentin  (NEURONTIN ) 300 MG capsule hydrOXYzine  (ATARAX ) 50 MG table  Has the patient contacted their pharmacy? Yes, told need new RX  This is the patient's preferred pharmacy:  [facility] [address] ([address]), [place] - [address] [place]) [place] [address] Phone: [phone] Fax: [phone]  Is this the correct pharmacy for this prescription? Yes If no, delete pharmacy and type the correct one.   Has the prescription been filled recently? Yes  Is the patient out of the medication? Yes, completely out needing fills ASAP.  Has the patient been seen for an appointment in the last year OR does the patient have an upcoming appointment? Yes, seen July 28  Can we respond through MyChart? No, patient prefers phone call.  Agent: Please be advised that Rx refills may take up to 3 business days. We ask that you follow-up with your pharmacy.

## 2024-08-07 ENCOUNTER — Other Ambulatory Visit: Payer: Self-pay

## 2024-08-07 MED ORDER — FOLIC ACID 1 MG PO TABS: 2.0000 mg | ORAL_TABLET | Freq: Every day | ORAL | 1 refills | Status: AC

## 2024-08-07 NOTE — Telephone Encounter (Signed)
 Requested Prescriptions  Pending Prescriptions Disp Refills   hydrOXYzine  (ATARAX ) 50 MG tablet [Pharmacy Med Name: HYDROXYZ HCL 50MG    TAB] 270 tablet 0    Sig: Take 1 tablet by mouth three times daily as needed     Ear, Nose, and Throat:  Antihistamines 2 Passed - 08/07/2024  9:18 AM      Passed - Cr in normal range and within 360 days    Creatinine  Date Value Ref Range Status  10/05/2013 0.62 0.60 - 1.30 mg/dL Final   Creatinine, Ser  Date Value Ref Range Status  07/20/2024 0.82 0.57 - 1.00 mg/dL Final         Passed - Valid encounter within last 12 months    Recent Outpatient Visits           2 weeks ago Primary hypertension   [facility] Herold Hadassah SQUIBB, MD   1 month ago Primary hypertension   [facility] Herold Hadassah SQUIBB, MD   1 month ago Chronic obstructive pulmonary disease, unspecified COPD type [facility])   [facility] Herold Hadassah SQUIBB, MD   4 months ago Dysuria   [facility] Cathryne JAYSON, MD   4 months ago Primary hypertension    [facility] Herold Hadassah SQUIBB, MD       Future Appointments             In 3 weeks Raulkar, Sven SQUIBB, MD [facility], [facility]

## 2024-08-10 NOTE — Telephone Encounter (Signed)
 Requested medication (s) are due for refill today: routing for review  Requested medication (s) are on the active medication list: yes  Last refill:  06/28/22  Future visit scheduled: no  Notes to clinic:  Unable to refill per protocol, last refill by another provider.      Requested Prescriptions  Pending Prescriptions Disp Refills   gabapentin  (NEURONTIN ) 300 MG capsule      Sig: Take 1 capsule (300 mg total) by mouth 3 (three) times daily.     Neurology: Anticonvulsants - gabapentin  Passed - 08/10/2024  3:01 PM      Passed - Cr in normal range and within 360 days    Creatinine  Date Value Ref Range Status  10/05/2013 0.62 0.60 - 1.30 mg/dL Final   Creatinine, Ser  Date Value Ref Range Status  07/20/2024 0.82 0.57 - 1.00 mg/dL Final         Passed - Completed PHQ-2 or PHQ-9 in the last 360 days      Passed - Valid encounter within last 12 months    Recent Outpatient Visits           3 weeks ago Primary hypertension   [place] [facility] Herold Hadassah SQUIBB, MD   1 month ago Primary hypertension   [place] [facility] Herold Hadassah SQUIBB, MD   1 month ago Chronic obstructive pulmonary disease, unspecified COPD type [facility])   [place] [facility] Herold Hadassah SQUIBB, MD   4 months ago Dysuria   [place] [facility] Cathryne JAYSON, MD   4 months ago Primary hypertension   [place] [facility] Herold Hadassah SQUIBB, MD       Future Appointments             In 3 weeks Raulkar, Sven SQUIBB, MD [facility]             hydrOXYzine  (ATARAX ) 50 MG tablet 30 tablet 0    Sig: Take 1 tablet (50 mg total) by mouth 3 (three) times daily as needed.     Ear, Nose, and Throat:  Antihistamines 2 Passed - 08/10/2024  3:01 PM      Passed - Cr in normal range and within 360 days    Creatinine  Date Value Ref Range Status  10/05/2013 0.62 0.60 - 1.30  mg/dL Final   Creatinine, Ser  Date Value Ref Range Status  07/20/2024 0.82 0.57 - 1.00 mg/dL Final         Passed - Valid encounter within last 12 months    Recent Outpatient Visits           3 weeks ago Primary hypertension   [place] [facility] Herold Hadassah SQUIBB, MD   1 month ago Primary hypertension   [place] [facility] Herold Hadassah SQUIBB, MD   1 month ago Chronic obstructive pulmonary disease, unspecified COPD type [facility])   [place] [facility] Herold Hadassah SQUIBB, MD   4 months ago Dysuria   [place] [facility] Cathryne JAYSON, MD   4 months ago Primary hypertension   [place] [facility] Herold Hadassah SQUIBB, MD       Future Appointments             In 3 weeks Raulkar, Sven SQUIBB, MD [facility]  traZODone  (DESYREL ) 50 MG tablet 30 tablet 1    Sig: Take 1 tablet (50 mg total) by mouth at bedtime.     Psychiatry: Antidepressants - Serotonin Modulator Passed - 08/10/2024  3:01 PM      Passed - Completed PHQ-2 or PHQ-9 in the last 360 days      Passed - Valid encounter within last 6 months    Recent Outpatient Visits           3 weeks ago Primary hypertension   [place] [facility] Herold Hadassah SQUIBB, MD   1 month ago Primary hypertension   [place] [facility] Herold Hadassah SQUIBB, MD   1 month ago Chronic obstructive pulmonary disease, unspecified COPD type [facility])   [place] [facility] Herold Hadassah SQUIBB, MD   4 months ago Dysuria    [facility] Cath[facility]ne JAYSON, MD   4 months ago Primary hypertension    [facility] Herold Hadassah SQUIBB, MD       Future Appointments             In 3 weeks Raulkar, Sven SQUIBB, MD [facility]

## 2024-08-12 ENCOUNTER — Other Ambulatory Visit: Payer: Self-pay | Admitting: Physical Medicine & Rehabilitation

## 2024-08-12 DIAGNOSIS — M5412 Radiculopathy, cervical region: Secondary | ICD-10-CM

## 2024-08-13 ENCOUNTER — Telehealth: Payer: Self-pay | Admitting: Pediatrics

## 2024-08-13 ENCOUNTER — Ambulatory Visit: Payer: Self-pay

## 2024-08-13 NOTE — Telephone Encounter (Signed)
 Copied from CRM #[id number]. Topic: Clinical - Medication Refill >> Aug 13, 2024 12:21 PM Zebedee SAUNDERS wrote: Pt wants to know why the medication have not been sent to pharmacy. Pt was seen by Dr.Santos on 07/20/2024.Please call pt at [phone]
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
## 2024-08-13 NOTE — Progress Notes (Signed)
 Anna Lucas is a 53 y.o. female presenting to clinic for trip and fall yesterday injuring right knee

## 2024-08-14 MED ORDER — TRAZODONE HCL 50 MG PO TABS: 50.0000 mg | ORAL_TABLET | Freq: Every day | ORAL | 1 refills | Status: DC

## 2024-08-14 MED ORDER — GABAPENTIN 300 MG PO CAPS: 300.0000 mg | ORAL_CAPSULE | Freq: Three times a day (TID) | ORAL | Status: DC

## 2024-08-14 MED ORDER — HYDROXYZINE HCL 50 MG PO TABS: 50.0000 mg | ORAL_TABLET | Freq: Three times a day (TID) | ORAL | 0 refills | Status: DC | PRN

## 2024-08-14 NOTE — Telephone Encounter (Signed)
 Attempted to reach patient to verify which meds need refill no answer left message   Okay for E2C2 to verify which meds needs refill if the call is returned
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
## 2024-08-18 ENCOUNTER — Encounter: Payer: Self-pay | Admitting: Psychiatry

## 2024-08-18 ENCOUNTER — Other Ambulatory Visit: Payer: Self-pay

## 2024-08-18 ENCOUNTER — Ambulatory Visit (INDEPENDENT_AMBULATORY_CARE_PROVIDER_SITE_OTHER): Payer: MEDICAID | Admitting: Psychiatry

## 2024-08-18 VITALS — BP 111/77 | HR 109 | Temp 98.4°F | Ht 65.5 in | Wt 130.4 lb

## 2024-08-18 DIAGNOSIS — F1020 Alcohol dependence, uncomplicated: Secondary | ICD-10-CM

## 2024-08-18 DIAGNOSIS — F4310 Post-traumatic stress disorder, unspecified: Secondary | ICD-10-CM

## 2024-08-18 DIAGNOSIS — G4700 Insomnia, unspecified: Secondary | ICD-10-CM | POA: Diagnosis not present

## 2024-08-18 DIAGNOSIS — F330 Major depressive disorder, recurrent, mild: Secondary | ICD-10-CM

## 2024-08-18 MED ORDER — VENLAFAXINE HCL ER 75 MG PO CP24: 75.0000 mg | ORAL_CAPSULE | Freq: Every day | ORAL | 0 refills | Status: DC

## 2024-08-18 MED ORDER — PRAZOSIN HCL 1 MG PO CAPS: 1.0000 mg | ORAL_CAPSULE | Freq: Every day | ORAL | 0 refills | Status: DC

## 2024-08-18 NOTE — Telephone Encounter (Signed)
 Pt says this was regarding gabapentin  which she has now received yesterday from a different provider (Dr. Dodson)

## 2024-08-18 NOTE — Telephone Encounter (Signed)
Second unsuccessful attempt to reach pt.

## 2024-08-18 NOTE — Patient Instructions (Signed)
 Continue venlafaxine  75 mg daily  Continue prazosin  1 mg at night   Continue hydroxyzine  50 mg daily as needed for anxiety  Next appointment: 10/23 at 3 pm

## 2024-08-19 ENCOUNTER — Other Ambulatory Visit: Payer: Self-pay

## 2024-08-19 ENCOUNTER — Emergency Department
Admission: EM | Admit: 2024-08-19 | Discharge: 2024-08-19 | Disposition: A | Discharge status: Home / Self Care | Payer: MEDICAID | Attending: Emergency Medicine | Admitting: Emergency Medicine

## 2024-08-19 ENCOUNTER — Emergency Department: Payer: MEDICAID

## 2024-08-19 DIAGNOSIS — S0181XA Laceration without foreign body of other part of head, initial encounter: Secondary | ICD-10-CM | POA: Diagnosis not present

## 2024-08-19 DIAGNOSIS — S0993XA Unspecified injury of face, initial encounter: Secondary | ICD-10-CM | POA: Diagnosis present

## 2024-08-19 DIAGNOSIS — J449 Chronic obstructive pulmonary disease, unspecified: Secondary | ICD-10-CM | POA: Insufficient documentation

## 2024-08-19 DIAGNOSIS — Z23 Encounter for immunization: Secondary | ICD-10-CM | POA: Diagnosis not present

## 2024-08-19 DIAGNOSIS — I10 Essential (primary) hypertension: Secondary | ICD-10-CM | POA: Diagnosis not present

## 2024-08-19 MED ORDER — IBUPROFEN 600 MG PO TABS
600.0000 mg | ORAL_TABLET | Freq: Once | ORAL | Status: AC
  Administered 2024-08-19: 600 mg via ORAL
  Filled 2024-08-19: qty 1

## 2024-08-19 MED ORDER — NAPROXEN 500 MG PO TBEC: 500.0000 mg | DELAYED_RELEASE_TABLET | Freq: Two times a day (BID) | ORAL | 0 refills | Status: DC

## 2024-08-19 MED ORDER — TETANUS-DIPHTH-ACELL PERTUSSIS 5-2.5-18.5 LF-MCG/0.5 IM SUSY
0.5000 mL | PREFILLED_SYRINGE | Freq: Once | INTRAMUSCULAR | Status: AC
  Administered 2024-08-19: 0.5 mL via INTRAMUSCULAR
  Filled 2024-08-19: qty 0.5
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
## 2024-08-26 NOTE — Telephone Encounter (Signed)
 No she doesn't met the criteria to do sleep study

## 2024-08-31 ENCOUNTER — Ambulatory Visit: Payer: MEDICAID | Admitting: Student in an Organized Health Care Education/Training Program

## 2024-09-02 ENCOUNTER — Encounter: Payer: Self-pay | Admitting: Pediatrics

## 2024-09-02 ENCOUNTER — Ambulatory Visit: Payer: MEDICAID | Admitting: Pediatrics

## 2024-09-02 VITALS — BP 124/85 | HR 116 | Temp 98.9°F | Wt 126.6 lb

## 2024-09-02 DIAGNOSIS — R000 Tachycardia, unspecified: Secondary | ICD-10-CM | POA: Diagnosis not present

## 2024-09-02 DIAGNOSIS — G4700 Insomnia, unspecified: Secondary | ICD-10-CM | POA: Diagnosis not present

## 2024-09-02 DIAGNOSIS — S0993XS Unspecified injury of face, sequela: Secondary | ICD-10-CM | POA: Diagnosis not present

## 2024-09-02 DIAGNOSIS — Z658 Other specified problems related to psychosocial circumstances: Secondary | ICD-10-CM

## 2024-09-02 DIAGNOSIS — Z23 Encounter for immunization: Secondary | ICD-10-CM

## 2024-09-02 MED ORDER — TRAZODONE HCL 100 MG PO TABS: 50.0000 mg | ORAL_TABLET | Freq: Every day | ORAL | 3 refills | Status: DC

## 2024-09-02 MED ORDER — TRAZODONE HCL 100 MG PO TABS: 100.0000 mg | ORAL_TABLET | Freq: Every day | ORAL | 3 refills | Status: AC
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
## 2024-09-02 NOTE — Patient Instructions (Signed)
 I sent refills

## 2024-09-03 ENCOUNTER — Encounter: Payer: MEDICAID | Admitting: Physical Medicine and Rehabilitation

## 2024-09-03 ENCOUNTER — Encounter: Payer: Self-pay | Admitting: *Deleted

## 2024-09-14 ENCOUNTER — Other Ambulatory Visit: Payer: Self-pay | Admitting: Physical Medicine & Rehabilitation

## 2024-09-14 ENCOUNTER — Encounter: Payer: Self-pay | Admitting: Physical Medicine & Rehabilitation

## 2024-09-14 NOTE — Discharge Instructions (Signed)

## 2024-09-15 ENCOUNTER — Inpatient Hospital Stay
Admission: RE | Admit: 2024-09-15 | Discharge: 2024-09-15 | Disposition: A | Discharge status: Home / Self Care | Payer: MEDICAID | Source: Ambulatory Visit | Attending: Physical Medicine & Rehabilitation | Admitting: Physical Medicine & Rehabilitation

## 2024-09-18 ENCOUNTER — Encounter: Payer: Self-pay | Admitting: Physical Medicine & Rehabilitation

## 2024-09-21 ENCOUNTER — Telehealth: Payer: MEDICAID | Admitting: *Deleted

## 2024-09-21 ENCOUNTER — Encounter: Payer: Self-pay | Admitting: Licensed Clinical Social Worker

## 2024-09-21 ENCOUNTER — Telehealth: Payer: Self-pay | Admitting: Licensed Clinical Social Worker

## 2024-09-21 NOTE — Patient Instructions (Signed)
 Anna Lucas - I am sorry I was unable to reach you today. I work with Herold, Hadassah SQUIBB, MD and am calling to support your healthcare needs. Please contact me at [phone] at your earliest convenience. I look forward to speaking with you soon.   Thank you,   Cena Ligas, LCSW Clinical Social Worker [facility]

## 2024-09-22 ENCOUNTER — Ambulatory Visit: Payer: MEDICAID | Admitting: Pediatrics

## 2024-09-28 ENCOUNTER — Other Ambulatory Visit (HOSPITAL_COMMUNITY): Payer: Self-pay | Admitting: Psychiatry

## 2024-09-28 ENCOUNTER — Telehealth: Payer: Self-pay | Admitting: Psychiatry

## 2024-09-28 MED ORDER — PRAZOSIN HCL 1 MG PO CAPS: 1.0000 mg | ORAL_CAPSULE | Freq: Every day | ORAL | 0 refills | Status: AC

## 2024-09-28 MED ORDER — VENLAFAXINE HCL ER 75 MG PO CP24: 75.0000 mg | ORAL_CAPSULE | Freq: Every day | ORAL | 0 refills | Status: AC

## 2024-09-28 NOTE — Telephone Encounter (Signed)
 sent

## 2024-09-30 ENCOUNTER — Encounter: Payer: Self-pay | Admitting: Oncology

## 2024-09-30 ENCOUNTER — Inpatient Hospital Stay: Payer: MEDICAID | Admitting: Oncology

## 2024-09-30 ENCOUNTER — Inpatient Hospital Stay: Payer: MEDICAID | Attending: Oncology

## 2024-09-30 VITALS — BP 110/86 | HR 108 | Temp 97.5°F | Resp 20 | Ht 65.0 in | Wt 124.0 lb

## 2024-09-30 DIAGNOSIS — F32A Depression, unspecified: Secondary | ICD-10-CM | POA: Diagnosis not present

## 2024-09-30 DIAGNOSIS — E538 Deficiency of other specified B group vitamins: Secondary | ICD-10-CM

## 2024-09-30 DIAGNOSIS — Z862 Personal history of diseases of the blood and blood-forming organs and certain disorders involving the immune mechanism: Secondary | ICD-10-CM | POA: Diagnosis not present

## 2024-09-30 DIAGNOSIS — D539 Nutritional anemia, unspecified: Secondary | ICD-10-CM | POA: Diagnosis present

## 2024-09-30 DIAGNOSIS — D519 Vitamin B12 deficiency anemia, unspecified: Secondary | ICD-10-CM | POA: Diagnosis not present

## 2024-09-30 DIAGNOSIS — F10988 Alcohol use, unspecified with other alcohol-induced disorder: Secondary | ICD-10-CM | POA: Diagnosis not present

## 2024-09-30 DIAGNOSIS — F17210 Nicotine dependence, cigarettes, uncomplicated: Secondary | ICD-10-CM | POA: Diagnosis not present

## 2024-09-30 LAB — FOLATE: Folate: >20 ng/mL (ref >5.9)

## 2024-09-30 LAB — CBC WITH DIFFERENTIAL (CANCER CENTER ONLY)
Abs Immature Granulocytes: 0.01 K/uL (ref 0.00–0.07)
Basophils Absolute: 0 K/uL (ref 0.0–0.1)
Basophils Relative: 1 %
Eosinophils Absolute: 0 K/uL (ref 0.0–0.5)
Eosinophils Relative: 0 %
HCT: 36.8 % (ref 36.0–46.0)
Hemoglobin: 11.9 g/dL — ABNORMAL LOW (ref 12.0–15.0)
Immature Granulocytes: 0 %
Lymphocytes Relative: 32 %
Lymphs Abs: 1.5 K/uL (ref 0.7–4.0)
MCH: 32.1 pg (ref 26.0–34.0)
MCHC: 32.3 g/dL (ref 30.0–36.0)
MCV: 99.2 fL (ref 80.0–100.0)
Monocytes Absolute: 0.4 K/uL (ref 0.1–1.0)
Monocytes Relative: 8 %
Neutro Abs: 2.6 K/uL (ref 1.7–7.7)
Neutrophils Relative %: 59 %
Platelet Count: 179 K/uL (ref 150–400)
RBC: 3.71 MIL/uL — ABNORMAL LOW (ref 3.87–5.11)
RDW: 14.4 % (ref 11.5–15.5)
WBC Count: 4.5 K/uL (ref 4.0–10.5)
nRBC: 0.4 % — ABNORMAL HIGH (ref 0.0–0.2)

## 2024-09-30 LAB — VITAMIN B12: Vitamin B-12: 692 pg/mL (ref 180–914)

## 2024-09-30 NOTE — Progress Notes (Signed)
 Patient has no new or acute concerns at this time.
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
## 2024-10-06 ENCOUNTER — Encounter: Payer: Self-pay | Admitting: *Deleted

## 2024-10-06 ENCOUNTER — Telehealth: Payer: MEDICAID | Admitting: *Deleted

## 2024-10-06 NOTE — Patient Instructions (Signed)
 Anna Lucas - I have attempted to call you three times but have been unsuccessful in reaching you. I work with Herold, Hadassah SQUIBB, MD and am calling to support your healthcare needs. If I can be of assistance to you, please contact me at [phone].     Thank you,    Amario Longmore, LCSW [place]  [facility], [facility] Licensed Clinical Social Worker  Direct Dial: [phone]

## 2024-10-07 ENCOUNTER — Telehealth: Payer: Self-pay | Admitting: *Deleted

## 2024-10-07 NOTE — Patient Outreach (Addendum)
 Return call from patient, reason for referral discussed.  Patient confirmed having no community resource needs at this time but was open to a follow up call in 2 weeks. Appointment scheduled for 10/30/24 at 2pm.   Lenn Mean, LCSW [place]  [facility], [facility] Licensed Clinical Social Worker  Direct Dial: [phone]
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
## 2024-10-14 NOTE — Discharge Instructions (Signed)

## 2024-10-15 ENCOUNTER — Inpatient Hospital Stay
Admission: RE | Admit: 2024-10-15 | Discharge: 2024-10-15 | Disposition: A | Discharge status: Home / Self Care | Payer: MEDICAID | Source: Ambulatory Visit | Attending: Physical Medicine & Rehabilitation | Admitting: Physical Medicine & Rehabilitation

## 2024-10-15 ENCOUNTER — Ambulatory Visit: Payer: MEDICAID | Admitting: Psychiatry

## 2024-10-16 ENCOUNTER — Other Ambulatory Visit: Payer: MEDICAID

## 2024-10-30 ENCOUNTER — Telehealth: Payer: MEDICAID | Admitting: *Deleted

## 2024-10-30 ENCOUNTER — Encounter: Payer: MEDICAID | Attending: Physical Medicine and Rehabilitation | Admitting: Physical Medicine and Rehabilitation

## 2024-10-30 NOTE — Discharge Instructions (Signed)

## 2024-11-03 ENCOUNTER — Ambulatory Visit: Payer: MEDICAID | Admitting: Pediatrics

## 2024-11-03 ENCOUNTER — Encounter: Payer: Self-pay | Admitting: Pediatrics

## 2024-11-03 VITALS — BP 155/90 | HR 102 | Resp 16 | Ht 65.0 in | Wt 127.0 lb

## 2024-11-03 DIAGNOSIS — Z122 Encounter for screening for malignant neoplasm of respiratory organs: Secondary | ICD-10-CM

## 2024-11-03 DIAGNOSIS — Z1231 Encounter for screening mammogram for malignant neoplasm of breast: Secondary | ICD-10-CM

## 2024-11-03 DIAGNOSIS — Z1211 Encounter for screening for malignant neoplasm of colon: Secondary | ICD-10-CM

## 2024-11-03 DIAGNOSIS — I10 Essential (primary) hypertension: Secondary | ICD-10-CM

## 2024-11-03 DIAGNOSIS — R110 Nausea: Secondary | ICD-10-CM | POA: Diagnosis not present

## 2024-11-03 DIAGNOSIS — K219 Gastro-esophageal reflux disease without esophagitis: Secondary | ICD-10-CM

## 2024-11-03 DIAGNOSIS — M79604 Pain in right leg: Secondary | ICD-10-CM | POA: Diagnosis not present

## 2024-11-03 MED ORDER — OMEPRAZOLE 20 MG PO CPDR
20.0000 mg | DELAYED_RELEASE_CAPSULE | Freq: Every day | ORAL | 3 refills | Status: AC
Start: 2024-11-03 — End: ?

## 2024-11-03 MED ORDER — GABAPENTIN 300 MG PO CAPS: 300.0000 mg | ORAL_CAPSULE | Freq: Three times a day (TID) | ORAL | 2 refills | Status: AC

## 2024-11-03 MED ORDER — ONDANSETRON 4 MG PO TBDP: 4.0000 mg | ORAL_TABLET | Freq: Three times a day (TID) | ORAL | 0 refills | Status: AC | PRN

## 2024-11-03 MED ORDER — NAPROXEN 500 MG PO TBEC: 500.0000 mg | DELAYED_RELEASE_TABLET | Freq: Two times a day (BID) | ORAL | 0 refills | Status: AC

## 2024-11-03 NOTE — Patient Instructions (Signed)
 You have an order for:  []   2D Mammogram  [x]   3D Mammogram  []   Bone Density     Please call for appointment:  [facility]  [address] [place] [address] [phone] [facility] [address] [place], [place] [address] [phone] [facility] [address] [place], [place] [address] [phone]   Make sure to wear two-piece clothing.  No lotions, powders, or deodorants the day of the appointment. Make sure to bring picture ID and insurance card.  Bring list of medications you are currently taking including any supplements.   Schedule your [place] screening mammogram through MyChart!   Log into your MyChart account.  Go to 'Visit' (or 'Appointments' if on mobile App) --> Schedule an Appointment  Under 'Select a Reason for Visit' choose the Mammogram Screening option.  Complete the pre-visit questions and select the time and place that best fits your schedule.

## 2024-11-03 NOTE — Progress Notes (Unsigned)
 Office Visit  BP (!) 155/90   Pulse (!) 102   Resp 16   Ht 5' 5 (1.651 m)   Wt 127 lb (57.6 kg)   LMP 07/24/2016   SpO2 98%   BMI 21.13 kg/m    Subjective:    Patient ID: Anna Lucas, female    DOB: [birth year], 53 y.o.   MRN: [id number]  HPI: Anna Lucas is a 53 y.o. female  Chief Complaint  Patient presents with  . Medical Management of Chronic Issues    Discussed the use of AI scribe software for clinical note transcription with the patient, who gave verbal consent to proceed.  History of Present Illness     Relevant past medical, surgical, family and social history reviewed and updated as indicated. Interim medical history since our last visit reviewed. Allergies and medications reviewed and updated.  ROS per HPI unless specifically indicated above     Objective:    BP (!) 155/90   Pulse (!) 102   Resp 16   Ht 5' 5 (1.651 m)   Wt 127 lb (57.6 kg)   LMP 07/24/2016   SpO2 98%   BMI 21.13 kg/m   Wt Readings from Last 3 Encounters:  11/03/24 127 lb (57.6 kg)  09/30/24 124 lb (56.2 kg)  09/02/24 126 lb 9.6 oz (57.4 kg)     Physical Exam      11/03/2024   11:25 AM 09/30/2024    2:13 PM 07/20/2024    2:13 PM 06/12/2024   10:37 AM 05/28/2024    2:29 PM  Depression screen PHQ 2/9  Decreased Interest 1 0 0 1   Down, Depressed, Hopeless 1 0 1 1   PHQ - 2 Score 2 0 1 2   Altered sleeping 0  1 1   Tired, decreased energy 1  1 1    Change in appetite 1  0 1   Feeling bad or failure about yourself  1  1 1    Trouble concentrating 0  0 0   Moving slowly or fidgety/restless 0  1 0   Suicidal thoughts 0  0 0   PHQ-9 Score 5  5  6     Difficult doing work/chores    Not difficult at all      Information is confidential and restricted. Go to Review Flowsheets to unlock data.   Data saved with a previous flowsheet row definition       11/03/2024   11:26 AM 07/20/2024    2:14 PM 06/12/2024   10:37 AM 05/28/2024    2:30 PM  GAD 7 : Generalized Anxiety  Score  Nervous, Anxious, on Edge 1 1 1    Control/stop worrying 1 1 1    Worry too much - different things 1 1 1    Trouble relaxing 1 0 0   Restless 0 0 0   Easily annoyed or irritable 0 0 0   Afraid - awful might happen 0 0 0   Total GAD 7 Score 4 3 3    Anxiety Difficulty   Not difficult at all      Information is confidential and restricted. Go to Review Flowsheets to unlock data.       Assessment & Plan:  Assessment & Plan   Screen for colon cancer -     Cologuard  Primary hypertension -     Comprehensive metabolic panel with GFR  Encounter for screening mammogram for malignant neoplasm of breast -  3D Screening Mammogram, Left and Right; Future  Screening for lung cancer -     Ambulatory Referral for Lung Cancer Scre  Gastroesophageal reflux disease without esophagitis -     Omeprazole; Take 1 capsule (20 mg total) by mouth daily.  Dispense: 30 capsule; Refill: 3  Nausea -     Ondansetron ; Take 1 tablet (4 mg total) by mouth every 8 (eight) hours as needed for nausea or vomiting.  Dispense: 20 tablet; Refill: 0  Right leg pain -     Gabapentin ; Take 1 capsule (300 mg total) by mouth 3 (three) times daily.  Dispense: 90 capsule; Refill: 2 -     Naproxen ; Take 1 tablet (500 mg total) by mouth 2 (two) times daily with a meal.  Dispense: 20 tablet; Refill: 0     Assessment and Plan Assessment & Plan      Follow up plan: Return in about 3 weeks (around 11/24/2024) for HTN, Chronic illness f/u.  Hadassah SHAUNNA Nett, MD

## 2024-11-04 LAB — COMPREHENSIVE METABOLIC PANEL WITH GFR
ALT: 22 IU/L (ref 0–32)
AST: 54 IU/L — ABNORMAL HIGH (ref 0–40)
Albumin: 4.4 g/dL (ref 3.8–4.9)
Alkaline Phosphatase: 142 IU/L — ABNORMAL HIGH (ref 49–135)
BUN/Creatinine Ratio: 10 (ref 9–23)
BUN: 9 mg/dL (ref 6–24)
Bilirubin Total: 0.4 mg/dL (ref 0.0–1.2)
CO2: 24 mmol/L (ref 20–29)
Calcium: 9 mg/dL (ref 8.7–10.2)
Chloride: 103 mmol/L (ref 96–106)
Creatinine, Ser: 0.9 mg/dL (ref 0.57–1.00)
Globulin, Total: 2.9 g/dL (ref 1.5–4.5)
Glucose: 112 mg/dL — ABNORMAL HIGH (ref 70–99)
Potassium: 3.6 mmol/L (ref 3.5–5.2)
Sodium: 145 mmol/L — ABNORMAL HIGH (ref 134–144)
Total Protein: 7.3 g/dL (ref 6.0–8.5)
eGFR: 77 mL/min/1.73 (ref >59)

## 2024-11-05 ENCOUNTER — Inpatient Hospital Stay
Admission: RE | Admit: 2024-11-05 | Discharge: 2024-11-05 | Disposition: A | Discharge status: Home / Self Care | Payer: MEDICAID | Source: Ambulatory Visit | Attending: Physical Medicine & Rehabilitation | Admitting: Physical Medicine & Rehabilitation

## 2024-11-05 ENCOUNTER — Ambulatory Visit: Payer: Self-pay | Admitting: Pediatrics

## 2024-11-06 ENCOUNTER — Encounter: Payer: Self-pay | Admitting: Pediatrics

## 2024-11-06 NOTE — Assessment & Plan Note (Signed)
 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 Currently on 10 mg lisinopril . - Monitor blood pressure at work. - Consider increasing lisinopril  to 20 mg if blood pressure remains elevated. - Follow-up in three weeks to reassess.

## 2024-11-11 ENCOUNTER — Other Ambulatory Visit: Payer: MEDICAID | Admitting: *Deleted

## 2024-11-11 NOTE — Patient Outreach (Signed)
 Follow up call to  patient.   Patient continues to confirm having no community resource needs at this time. Patient states that she is active with therapist and peer support through [employer]. They are  also assisting with housing. This child psychotherapist provided patient with this social worker's contact information in the event assistance is needed in the future.  Kawehi Hostetter, LCSW [place]  [facility], [facility] Licensed Clinical Social Worker  Direct Dial: [phone]

## 2024-11-18 ENCOUNTER — Telehealth: Payer: Self-pay | Admitting: Acute Care

## 2024-11-18 DIAGNOSIS — Z122 Encounter for screening for malignant neoplasm of respiratory organs: Secondary | ICD-10-CM

## 2024-11-18 DIAGNOSIS — Z87891 Personal history of nicotine dependence: Secondary | ICD-10-CM

## 2024-11-18 DIAGNOSIS — F17210 Nicotine dependence, cigarettes, uncomplicated: Secondary | ICD-10-CM

## 2024-11-18 NOTE — Telephone Encounter (Signed)
 Lung Cancer Screening Narrative/Criteria Questionnaire (Cigarette Smokers Only- No Cigars/Pipes/vapes)   Anna Lucas   SDMV:12/01/2024 9:30a [name]         Apr 15, 1971   LDCT: Patient wants to wait to schedule LDCT when she gets her work schedule    53 y.o.   Phone: [phone]  Lung Screening Narrative (confirm age 62-77 yrs Medicare / 50-80 yrs Private pay insurance)   Insurance information:[employer]   Referring Provider:Maria Herold MD   This screening involves an initial phone call with a team member from our program. It is called a shared decision making visit. The initial meeting is required by  insurance and Medicare to make sure you understand the program. This appointment takes about 15-20 minutes to complete. You will complete the screening scan at your scheduled date/time.  This scan takes about 5-10 minutes to complete. You can eat and drink normally before and after the scan.  Criteria questions for Lung Cancer Screening:   Are you a current or former smoker? Current Age began smoking: 53yo   If you are a former smoker, what year did you quit smoking? N/A(within 15 yrs)   To calculate your smoking history, I need an accurate estimate of how many packs of cigarettes you smoked per day and for how many years. (Not just the number of PPD you are now smoking)   Years smoking 36 x Packs per day 1 = Pack years 36   (at least 20 pack yrs)   (Make sure they understand that we need to know how much they have smoked in the past, not just the number of PPD they are smoking now)  Do you have a personal history of cancer?  No    Do you have a family history of cancer? Yes  (cancer type and and relative) Father - testicular   Are you coughing up blood?  No  Have you had unexplained weight loss of 15 lbs or more in the last 6 months? No  It looks like you meet all criteria.  When would be a good time for us  to schedule you for this screening?   Additional information:  N/A

## 2024-12-01 ENCOUNTER — Ambulatory Visit: Payer: MEDICAID | Admitting: Pediatrics

## 2024-12-01 ENCOUNTER — Ambulatory Visit: Payer: MEDICAID

## 2024-12-01 ENCOUNTER — Encounter: Payer: Self-pay | Admitting: Emergency Medicine

## 2024-12-01 ENCOUNTER — Encounter: Payer: Self-pay | Admitting: Pediatrics

## 2024-12-01 VITALS — BP 126/85 | HR 100 | Temp 98.3°F | Ht 65.0 in | Wt 135.6 lb

## 2024-12-01 DIAGNOSIS — I10 Essential (primary) hypertension: Secondary | ICD-10-CM

## 2024-12-01 MED ORDER — OLMESARTAN MEDOXOMIL 20 MG PO TABS: 20.0000 mg | ORAL_TABLET | Freq: Every day | ORAL | 2 refills | Status: AC

## 2024-12-01 NOTE — Patient Instructions (Addendum)
 Stop lisinopril , we are switching you to another blood pressure medication called olmesartan 

## 2024-12-01 NOTE — Progress Notes (Unsigned)
 Attempted to reach pt x3.  Left message to call office to set up appointments for [facility] and [facility]

## 2024-12-01 NOTE — Progress Notes (Signed)
 Office Visit  BP 126/85 (BP Location: Left Arm, Cuff Size: Normal)   Pulse 100   Temp 98.3 F (36.8 C)   Ht 5' 5 (1.651 m)   Wt 135 lb 9.6 oz (61.5 kg)   LMP 07/24/2016   SpO2 94%   BMI 22.57 kg/m    Subjective:    Patient ID: Anna Lucas, female    DOB: Oct 11, 1971, 53 y.o.   MRN: [id number]  HPI: Anna Lucas is a 53 y.o. female  Chief Complaint  Patient presents with   Anxiety   COPD   Depression   Hypertension    #alcohol use disorder Visit today difficult today, patient difficult to redirect She was unable to tell me how she is drinking but was slurring speech and tangential  Of note, she did have alcoholic beverage attempted to be hidden below her winter jacket She was tearful throughout  She alluded to transportation issues and feeling unsafe at home but did not want to talk about this further  #cough Chronic cough present, taking breztri  Unable to discuss history, character or course of cough due to above  Relevant past medical, surgical, family and social history reviewed and updated as indicated. Interim medical history since our last visit reviewed. Allergies and medications reviewed and updated.  ROS per HPI unless specifically indicated above     Objective:    BP 126/85 (BP Location: Left Arm, Cuff Size: Normal)   Pulse 100   Temp 98.3 F (36.8 C)   Ht 5' 5 (1.651 m)   Wt 135 lb 9.6 oz (61.5 kg)   LMP 07/24/2016   SpO2 94%   BMI 22.57 kg/m   Wt Readings from Last 3 Encounters:  12/01/24 135 lb 9.6 oz (61.5 kg)  11/03/24 127 lb (57.6 kg)  09/30/24 124 lb (56.2 kg)     Physical Exam Constitutional:      Appearance: Normal appearance.  Pulmonary:     Effort: Pulmonary effort is normal.  Musculoskeletal:        General: Normal range of motion.  Skin:    Comments: Normal skin color  Neurological:     General: No focal deficit present.     Mental Status: She is alert. Mental status is at baseline.  Psychiatric:        Mood and  Affect: Mood normal. Affect is labile and tearful.        Speech: Speech is slurred and tangential.        Behavior: Behavior normal.        Thought Content: Thought content normal.        Judgment: Judgment is inappropriate.         11/03/2024   11:25 AM 09/30/2024    2:13 PM 07/20/2024    2:13 PM 06/12/2024   10:37 AM 05/28/2024    2:29 PM  Depression screen PHQ 2/9  Decreased Interest 1 0 0 1   Down, Depressed, Hopeless 1 0 1 1   PHQ - 2 Score 2 0 1 2   Altered sleeping 0  1 1   Tired, decreased energy 1  1 1    Change in appetite 1  0 1   Feeling bad or failure about yourself  1  1 1    Trouble concentrating 0  0 0   Moving slowly or fidgety/restless 0  1 0   Suicidal thoughts 0  0 0   PHQ-9 Score 5  5  6  Difficult doing work/chores    Not difficult at all      Information is confidential and restricted. Go to Review Flowsheets to unlock data.   Data saved with a previous flowsheet row definition       11/03/2024   11:26 AM 07/20/2024    2:14 PM 06/12/2024   10:37 AM 05/28/2024    2:30 PM  GAD 7 : Generalized Anxiety Score  Nervous, Anxious, on Edge 1 1 1    Control/stop worrying 1 1 1    Worry too much - different things 1 1 1    Trouble relaxing 1 0 0   Restless 0 0 0   Easily annoyed or irritable 0 0 0   Afraid - awful might happen 0 0 0   Total GAD 7 Score 4 3 3    Anxiety Difficulty   Not difficult at all      Information is confidential and restricted. Go to Review Flowsheets to unlock data.       Assessment & Plan:  Assessment & Plan   Alcohol use disorder, severe, dependence (HCC) Assessment & Plan: Strong suspicion patient intoxicated today with bottle present and attempted to be hidden. I encouraged patient to discuss resources with our social work team but has declined resources in the past. Previously discuss naltrexone  but stopped taking. I am also concerned about safety at home. Pt did not drive today and has transportation. Schedule close follow up  given transition of PCP in the new year.   Chronic obstructive pulmonary disease, unspecified COPD type (HCC) Assessment & Plan: Coughing throughout visit. I wonder if lisinopril  contributing so will switch to olmesartan . Consider pulm referral if ongoing symptoms, no showed most recent visit with them. Continue breztri  and prn albuterol .    Primary hypertension Assessment & Plan: Stop lisinopril  c/f dry cough, will switch to olmesartan . Follow up in 1 month for repeat.  Orders: -     Olmesartan  Medoxomil; Take 1 tablet (20 mg total) by mouth daily.  Dispense: 30 tablet; Refill: 2  Anxiety and depression Psychosocial stressors Patient mental health continues to be a concern. Unable to have full evaluation but is established with behavioral health. Has declined social work resources but would continue to encourage her at visits to talk with them.      Follow up plan: Return in about 4 weeks (around 12/29/2024) for HTN.  Hadassah SHAUNNA Nett, MD

## 2024-12-03 ENCOUNTER — Other Ambulatory Visit: Payer: MEDICAID

## 2024-12-05 ENCOUNTER — Encounter: Payer: Self-pay | Admitting: Pediatrics

## 2024-12-05 NOTE — Assessment & Plan Note (Addendum)
 Coughing throughout visit. I wonder if lisinopril  contributing so will switch to olmesartan . Consider pulm referral if ongoing symptoms, no showed most recent visit. Continue breztri  and prn albuterol .

## 2024-12-05 NOTE — Assessment & Plan Note (Signed)
 Strong suspicion patient intoxicated today with bottle present and attempted to be hidden. I encouraged patient to discuss resources with our social work team but has declined resources in the past. Previously discuss naltrexone  but did not take. I am also concerned about safety at home. Pt did not drive today and has transportation.

## 2024-12-05 NOTE — Assessment & Plan Note (Signed)
 Stop lisinopril  c/f dry cough, will switch to olmesartan . Follow up in 1 month for repeat.

## 2024-12-07 ENCOUNTER — Other Ambulatory Visit: Payer: MEDICAID

## 2024-12-09 NOTE — Discharge Instructions (Signed)

## 2024-12-10 ENCOUNTER — Inpatient Hospital Stay
Admission: RE | Admit: 2024-12-10 | Discharge: 2024-12-10 | Payer: MEDICAID | Attending: Physical Medicine & Rehabilitation | Admitting: Physical Medicine & Rehabilitation

## 2024-12-10 DIAGNOSIS — M5412 Radiculopathy, cervical region: Secondary | ICD-10-CM

## 2024-12-10 MED ADMIN — Triamcinolone Acetonide Inj Susp 40 MG/ML: 60 mg | EPIDURAL | @ 12:00:00 | NDC 00003029305

## 2024-12-10 MED ADMIN — Iopamidol Inj 61%: 1 mL | EPIDURAL | @ 12:00:00 | NDC 00270141215
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
## 2025-01-14 ENCOUNTER — Ambulatory Visit: Payer: MEDICAID | Admitting: Psychiatry

## 2025-01-26 ENCOUNTER — Ambulatory Visit: Payer: MEDICAID | Admitting: Family Medicine

## 2025-01-26 NOTE — Progress Notes (Unsigned)
 "  LMP 07/24/2016    Subjective:    Patient ID: Anna Lucas, female    DOB: November 18, 1971, 54 y.o.   MRN: [id number]  HPI: Anna Lucas is a 53 y.o. female  No chief complaint on file.  HYPERTENSION  Hypertension status: {Blank single:19197::controlled,uncontrolled,better,worse,exacerbated,stable}  Satisfied with current treatment? {Blank single:19197::yes,no} Duration of hypertension: {Blank single:19197::chronic,months,years} BP monitoring frequency:  {Blank single:19197::not checking,rarely,daily,weekly,monthly,a few times a day,a few times a week,a few times a month} BP range:  BP medication side effects:  {Blank single:19197::yes,no} Medication compliance: {Blank single:19197::excellent compliance,good compliance,fair compliance,poor compliance} Previous BP meds:{Blank multiple:19196::none,amlodipine ,amlodipine /benazepril,atenolol,benazepril,benazepril/HCTZ,bisoprolol (bystolic),carvedilol,chlorthalidone,clonidine ,diltiazem,exforge HCT,HCTZ,irbesartan (avapro),labetalol,lisinopril ,lisinopril -HCTZ,losartan (cozaar),methyldopa,nifedipine,olmesartan  (benicar ),olmesartan -HCTZ,quinapril,ramipril,spironalactone,tekturna,valsartan,valsartan-HCTZ,verapamil} Aspirin: {Blank single:19197::yes,no} Recurrent headaches: {Blank single:19197::yes,no} Visual changes: {Blank single:19197::yes,no} Palpitations: {Blank single:19197::yes,no} Dyspnea: {Blank single:19197::yes,no} Chest pain: {Blank single:19197::yes,no} Lower extremity edema: {Blank single:19197::yes,no} Dizzy/lightheaded: {Blank single:19197::yes,no}  Active Ambulatory Problems    Diagnosis Date Noted   Anxiety and depression 05/11/2022   GERD (gastroesophageal reflux disease) 05/11/2022   Peripheral neuropathy 05/11/2022   Alcohol use disorder, severe, dependence (HCC) 05/11/2022    Thrombocytopenia 05/11/2022   Obesity (BMI 30-39.9) 06/22/2022   Chronic obstructive pulmonary disease (HCC) 03/11/2024   Seborrheic dermatitis 03/11/2024   History of anemia due to vitamin B12 deficiency 03/11/2024   Hypertension 09/03/2017   Menopausal symptoms 05/08/2022   Osteoarthritis of both knees 04/25/2023   Chronic sinusitis 06/21/2024   Calcified nodule 07/26/2024   Chronic low back pain 07/26/2024   Insomnia 09/02/2024   Resolved Ambulatory Problems    Diagnosis Date Noted   Pyelonephritis 12/19/2015   Intractable vomiting 08/17/2016   Colon cancer screening 02/07/2021   Acute hepatitis 05/11/2022   Hyponatremia 05/12/2022   Hypophosphatemia 05/12/2022   Hypomagnesemia 05/12/2022   Hypoalbuminemia 05/12/2022   Leukopenia 05/12/2022   Myelosuppression 05/12/2022   Elevated ferritin 05/13/2022   CAP (community acquired pneumonia) 06/17/2022   Toxic shock (HCC) 06/17/2022   Facial swelling    Sepsis due to Streptococcus pyogenes (HCC)    Hypokalemia 06/17/2022   Facial trauma, subsequent encounter    Acute metabolic encephalopathy 06/22/2022   Alcohol withdrawal delirium, persistent, hyperactive (HCC) 06/22/2022   Yeast infection 06/24/2022   Abscessed tooth 01/02/2021   Atopic dermatitis, unspecified 12/03/2018   Bilateral primary osteoarthritis of knee 04/25/2023   Gout 02/17/2021   Major depressive disorder, recurrent, moderate (HCC) 05/16/2023   Symptomatic menopausal or female climacteric states 01/31/2023   Alcohol abuse with withdrawal, uncomplicated (HCC) 12/09/2020   Raised serum iron 12/22/2020   Tachycardia 09/02/2024   Hot flashes 01/31/2023   Past Medical History:  Diagnosis Date   Acid reflux    Alcohol abuse    Anxiety    Depression    PTSD (post-traumatic stress disorder)    Past Surgical History:  Procedure Laterality Date   CESAREAN SECTION     Outpatient Encounter Medications as of 01/26/2025  Medication Sig   albuterol  (VENTOLIN   HFA) 108 (90 Base) MCG/ACT inhaler Inhale 2 puffs into the lungs every 6 (six) hours as needed for wheezing or shortness of breath.   budeson-glycopyrrolate-formoterol (BREZTRI  AEROSPHERE) 160-9-4.8 MCG/ACT AERO Inhale 2 puffs into the lungs 2 (two) times daily.   cyanocobalamin  (VITAMIN B12) 1000 MCG/ML injection 1,000 mcg once.   folic acid  (FOLVITE ) 1 MG tablet Take 2 tablets (2 mg total) by mouth daily.   gabapentin  (NEURONTIN ) 300 MG capsule Take 1 capsule (300 mg total) by mouth 3 (three) times daily.   hydrOXYzine  (ATARAX ) 50 MG tablet Take 1 tablet by mouth three times daily as needed  KETOCONAZOLE , TOPICAL, 1 % SHAM Use once or twice daily in the shower for 2 weeks   naproxen  (EC NAPROSYN ) 500 MG EC tablet Take 1 tablet (500 mg total) by mouth 2 (two) times daily with a meal.   olmesartan  (BENICAR ) 20 MG tablet Take 1 tablet (20 mg total) by mouth daily.   omeprazole  (PRILOSEC) 20 MG capsule Take 1 capsule (20 mg total) by mouth daily.   ondansetron  (ZOFRAN -ODT) 4 MG disintegrating tablet Take 1 tablet (4 mg total) by mouth every 8 (eight) hours as needed for nausea or vomiting.   prazosin  (MINIPRESS ) 1 MG capsule Take 1 capsule (1 mg total) by mouth at bedtime.   Syringe/Needle, Disp, (SYRINGE 3CC/25GX1) 25G X 1 3 ML MISC 1 Syringe by Does not apply route every 30 (thirty) days.   traZODone  (DESYREL ) 100 MG tablet Take 1 tablet (100 mg total) by mouth at bedtime.   venlafaxine  XR (EFFEXOR -XR) 75 MG 24 hr capsule Take 1 capsule (75 mg total) by mouth daily with breakfast.   No facility-administered encounter medications on file as of 01/26/2025.   Allergies[1] Family History  Problem Relation Age of Onset   Diabetes Mellitus II Maternal Grandmother    Emphysema Maternal Grandmother    CAD Paternal Grandfather    Stroke Paternal Grandfather    Social History   Socioeconomic History   Marital status: Single    Spouse name: Not on file   Number of children: 2   Years of  education: Not on file   Highest education level: Associate degree: academic program  Occupational History   Not on file  Tobacco Use   Smoking status: Every Day    Current packs/day: 1.00    Average packs/day: 1 pack/day for 37.1 years (36.8 ttl pk-yrs)    Types: Cigarettes    Start date: 12/25/1987   Smokeless tobacco: Never  Vaping Use   Vaping status: Never Used  Substance and Sexual Activity   Alcohol use: Yes    Alcohol/week: 12.0 standard drinks of alcohol    Types: 6 Glasses of wine, 6 Shots of liquor per week    Comment: twice a week   Drug use: Never   Sexual activity: Not Currently  Other Topics Concern   Not on file  Social History Narrative   ** Merged History Encounter **       Social Drivers of Health   Tobacco Use: High Risk (12/05/2024)   Patient History    Smoking Tobacco Use: Every Day    Smokeless Tobacco Use: Never    Passive Exposure: Not on file  Financial Resource Strain: Low Risk  (08/26/2024)   Received from [facility]   Overall Financial Resource Strain (CARDIA)    Difficulty of Paying Living Expenses: Not hard at all  Food Insecurity: No Food Insecurity (08/26/2024)   Received from [facility]   Epic    Within the past 12 months, you worried that your food would run out before you got the money to buy more.: Never true    Within the past 12 months, the food you bought just didn't last and you didn't have money to get more.: Never true  Transportation Needs: No Transportation Needs (08/26/2024)   [facility] from [facility]    In the past 12 months, has lack of transportation kept you from medical appointments or from getting medications?: No    Lack of Transportation (Non-Medical): No  Physical Activity: Not on file  Stress: Not on file  Social Connections: Not on file  Depression (PHQ2-9): Medium Risk (11/03/2024)   Depression (PHQ2-9)    PHQ-2 Score: 5  Alcohol  Screen: Not on file  Housing: Low Risk  (08/26/2024)   Received from [facility]   Epic    In the last 12 months, was there a time when you were not able to pay the mortgage or rent on time?: No    In the past 12 months, how many times have you moved where you were living?: 0    At any time in the past 12 months, were you homeless or living in a shelter (including now)?: No  Utilities: Not At Risk (08/26/2024)   Received from [facility]   Epic    In the past 12 months has the electric, gas, oil, or water  company threatened to shut off services in your home?: No  Health Literacy: Not on file    Review of Systems  Per HPI unless specifically indicated above     Objective:    LMP 07/24/2016   Wt Readings from Last 3 Encounters:  12/01/24 135 lb 9.6 oz (61.5 kg)  11/03/24 127 lb (57.6 kg)  09/30/24 124 lb (56.2 kg)    Physical Exam  Results for orders placed or performed in visit on 11/03/24  Comp Met (CMET)   Collection Time: 11/03/24 11:50 AM  Result Value Ref Range   Glucose 112 (H) 70 - 99 mg/dL   BUN 9 6 - 24 mg/dL   Creatinine, Ser 9.09 0.57 - 1.00 mg/dL   eGFR 77 >40 fO/fpw/8.26   BUN/Creatinine Ratio 10 9 - 23   Sodium 145 (H) 134 - 144 mmol/L   Potassium 3.6 3.5 - 5.2 mmol/L   Chloride 103 96 - 106 mmol/L   CO2 24 20 - 29 mmol/L   Calcium  9.0 8.7 - 10.2 mg/dL   Total Protein 7.3 6.0 - 8.5 g/dL   Albumin 4.4 3.8 - 4.9 g/dL   Globulin, Total 2.9 1.5 - 4.5 g/dL   Bilirubin Total 0.4 0.0 - 1.2 mg/dL   Alkaline Phosphatase 142 (H) 49 - 135 IU/L   AST 54 (H) 0 - 40 IU/L   ALT 22 0 - 32 IU/L      Assessment & Plan:   Problem List Items Addressed This Visit   None    Follow up plan: No follow-ups on file.         [1]  Allergies Allergen Reactions   Prednisone  Swelling    Feet swelling, feeling like crap    "

## 2025-02-01 ENCOUNTER — Inpatient Hospital Stay: Payer: MEDICAID

## 2025-02-09 ENCOUNTER — Ambulatory Visit: Payer: MEDICAID | Admitting: Family Medicine

## 2025-05-31 ENCOUNTER — Other Ambulatory Visit: Payer: MEDICAID

## 2025-05-31 ENCOUNTER — Ambulatory Visit: Payer: MEDICAID | Admitting: Oncology
